# Patient Record
Sex: Male | Born: 1970 | Race: White | Hispanic: No | Marital: Single | Smoking: Current every day smoker
Health system: Southern US, Community
[De-identification: ages and names within clinical notes are randomized; demographics above are authoritative.]

## PROBLEM LIST (undated history)

## (undated) DIAGNOSIS — R51 Headache: Secondary | ICD-10-CM

## (undated) DIAGNOSIS — K5712 Diverticulitis of small intestine without perforation or abscess without bleeding: Secondary | ICD-10-CM

## (undated) DIAGNOSIS — Q600 Renal agenesis, unilateral: Secondary | ICD-10-CM

## (undated) DIAGNOSIS — K589 Irritable bowel syndrome without diarrhea: Secondary | ICD-10-CM

## (undated) DIAGNOSIS — K5710 Diverticulosis of small intestine without perforation or abscess without bleeding: Secondary | ICD-10-CM

## (undated) DIAGNOSIS — K219 Gastro-esophageal reflux disease without esophagitis: Secondary | ICD-10-CM

## (undated) DIAGNOSIS — M4800 Spinal stenosis, site unspecified: Secondary | ICD-10-CM

## (undated) DIAGNOSIS — M1990 Unspecified osteoarthritis, unspecified site: Secondary | ICD-10-CM

## (undated) DIAGNOSIS — Z8781 Personal history of (healed) traumatic fracture: Secondary | ICD-10-CM

## (undated) DIAGNOSIS — K253 Acute gastric ulcer without hemorrhage or perforation: Secondary | ICD-10-CM

## (undated) DIAGNOSIS — F419 Anxiety disorder, unspecified: Secondary | ICD-10-CM

## (undated) DIAGNOSIS — F32A Depression, unspecified: Secondary | ICD-10-CM

## (undated) DIAGNOSIS — I10 Essential (primary) hypertension: Secondary | ICD-10-CM

## (undated) DIAGNOSIS — IMO0002 Reserved for concepts with insufficient information to code with codable children: Secondary | ICD-10-CM

## (undated) DIAGNOSIS — G8929 Other chronic pain: Secondary | ICD-10-CM

## (undated) DIAGNOSIS — F329 Major depressive disorder, single episode, unspecified: Secondary | ICD-10-CM

## (undated) DIAGNOSIS — F209 Schizophrenia, unspecified: Secondary | ICD-10-CM

## (undated) DIAGNOSIS — M549 Dorsalgia, unspecified: Secondary | ICD-10-CM

## (undated) HISTORY — DX: Diverticulosis of small intestine without perforation or abscess without bleeding: K57.10

## (undated) HISTORY — DX: Renal agenesis, unilateral: Q60.0

## (undated) HISTORY — DX: Diverticulitis of small intestine without perforation or abscess without bleeding: K57.12

---
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
## 2005-08-21 ENCOUNTER — Encounter: Payer: Self-pay | Admitting: Orthopedic Surgery

## 2007-08-10 ENCOUNTER — Ambulatory Visit (HOSPITAL_COMMUNITY)
Admission: RE | Admit: 2007-08-10 | Discharge: 2007-08-10 | Payer: Self-pay | Admitting: Physical Medicine and Rehabilitation

## 2007-08-10 ENCOUNTER — Encounter: Payer: Self-pay | Admitting: Orthopedic Surgery

## 2009-06-24 ENCOUNTER — Encounter: Payer: Self-pay | Admitting: Orthopedic Surgery

## 2009-06-27 ENCOUNTER — Telehealth: Payer: Self-pay | Admitting: Orthopedic Surgery

## 2009-06-27 ENCOUNTER — Ambulatory Visit: Payer: Self-pay | Admitting: Orthopedic Surgery

## 2009-06-27 DIAGNOSIS — M549 Dorsalgia, unspecified: Secondary | ICD-10-CM | POA: Insufficient documentation

## 2009-07-05 ENCOUNTER — Telehealth: Payer: Self-pay | Admitting: Orthopedic Surgery

## 2009-07-09 ENCOUNTER — Encounter (INDEPENDENT_AMBULATORY_CARE_PROVIDER_SITE_OTHER): Payer: Self-pay | Admitting: *Deleted

## 2009-09-09 ENCOUNTER — Emergency Department (HOSPITAL_COMMUNITY): Admission: EM | Admit: 2009-09-09 | Discharge: 2009-09-09 | Payer: Self-pay | Admitting: Emergency Medicine

## 2010-02-18 NOTE — Miscellaneous (Signed)
Summary: pain clinic  Clinical Lists Changes  Patient does have [place] [facility], but [facility] does not offer pain management so this pt will have to pay out of pocket, I advised pt, he is calling pain med center now to see if they can set up payment arrangements at [phone].
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
## 2010-02-18 NOTE — Progress Notes (Signed)
Summary: patient req's referral to a [facility] facility,apply'g for discount  Phone Note Outgoing Call   Call placed to: Patient Summary of Call: Patient is to apply for [facility] discount today; requests his pain managment referral be made to a [facility] facility if possible.  Initial call taken by: Cammie Sickle,  June 27, 2009 11:11 AM  Follow-up for Phone Call        carol is this possible   i am the only ortho that takes [place] discount   i cant help him with this   he can be referred to the dr of his choice  Follow-up by: Fuller Canada MD,  June 27, 2009 11:39 AM  Additional Follow-up for Phone Call Additional follow up Details #1::        The [place]-related pain management office is [facility] on [address]  - per Noreene Larsson, [facility] referral coordinator -  Ph [phone] and Fax [phone].  I left patient voice mail msg to check on status if applied for discount.  Ref form copy in referral box Additional Follow-up by: Cammie Sickle,  July 03, 2009 12:30 PM

## 2010-02-18 NOTE — Progress Notes (Signed)
Summary: Referral to Pain Clinic.  Phone Note Outgoing Call   Call placed by: Waldon Reining,  July 05, 2009 8:11 AM Call placed to: Specialist Action Taken: Information Sent Summary of Call: I faxed a referral for this patient to [facility] in [place] for pain management for chronic back pain.

## 2010-02-18 NOTE — Progress Notes (Signed)
Summary: pain management  Phone Note Call from Patient Call back at Home Phone [phone]   Summary of Call: called pt and let him know on his machine that we faxed order for pain management in [place] and he will be required to pay 300 dlllars the 1st visit and then be billed the rest, I advised him where we referred him and if he would call them and let them know if he can afford this or not, other info in EMR notes if needed  I faxed a referral for this patient to [facility] in [place] for pain management for chronic back pain. Initial call taken by: Ether Griffins,  July 05, 2009 11:19 AM  Follow-up for Phone Call        patient has financial application in processing, being reviewed for [employer] discount. Follow-up by: Cammie Sickle,  July 05, 2009 1:37 PM

## 2010-02-18 NOTE — Letter (Signed)
Summary: History form  History form   Imported By: Jacklynn Ganong 07/04/2009 13:23:11  _____________________________________________________________________  External Attachment:    Type:   Image     Comment:   External Document

## 2010-04-16 ENCOUNTER — Other Ambulatory Visit: Payer: Self-pay | Admitting: Orthopedic Surgery

## 2011-08-21 ENCOUNTER — Other Ambulatory Visit: Payer: Self-pay | Admitting: Rheumatology

## 2011-08-21 ENCOUNTER — Ambulatory Visit
Admission: RE | Admit: 2011-08-21 | Discharge: 2011-08-21 | Disposition: A | Discharge status: Home / Self Care | Payer: Self-pay | Source: Ambulatory Visit | Attending: Rheumatology | Admitting: Rheumatology

## 2011-08-21 DIAGNOSIS — M545 Low back pain: Secondary | ICD-10-CM

## 2011-08-21 DIAGNOSIS — M549 Dorsalgia, unspecified: Secondary | ICD-10-CM

## 2011-11-17 ENCOUNTER — Other Ambulatory Visit (HOSPITAL_COMMUNITY): Payer: Self-pay | Admitting: Orthopaedic Surgery

## 2011-11-17 DIAGNOSIS — M4310 Spondylolisthesis, site unspecified: Secondary | ICD-10-CM

## 2011-11-19 ENCOUNTER — Ambulatory Visit (HOSPITAL_COMMUNITY)
Admission: RE | Admit: 2011-11-19 | Discharge: 2011-11-19 | Disposition: A | Discharge status: Home / Self Care | Payer: Self-pay | Source: Ambulatory Visit | Attending: Orthopaedic Surgery | Admitting: Orthopaedic Surgery

## 2011-11-19 DIAGNOSIS — M5137 Other intervertebral disc degeneration, lumbosacral region: Secondary | ICD-10-CM | POA: Insufficient documentation

## 2011-11-19 DIAGNOSIS — M545 Low back pain, unspecified: Secondary | ICD-10-CM | POA: Insufficient documentation

## 2011-11-19 DIAGNOSIS — M51379 Other intervertebral disc degeneration, lumbosacral region without mention of lumbar back pain or lower extremity pain: Secondary | ICD-10-CM | POA: Insufficient documentation

## 2011-11-19 DIAGNOSIS — M4310 Spondylolisthesis, site unspecified: Secondary | ICD-10-CM

## 2012-02-04 ENCOUNTER — Emergency Department (HOSPITAL_COMMUNITY): Payer: Self-pay

## 2012-02-04 ENCOUNTER — Emergency Department (HOSPITAL_COMMUNITY)
Admission: EM | Admit: 2012-02-04 | Discharge: 2012-02-04 | Disposition: A | Discharge status: Home / Self Care | Payer: Self-pay | Attending: Emergency Medicine | Admitting: Emergency Medicine

## 2012-02-04 ENCOUNTER — Encounter (HOSPITAL_COMMUNITY): Payer: Self-pay | Admitting: Emergency Medicine

## 2012-02-04 DIAGNOSIS — F17200 Nicotine dependence, unspecified, uncomplicated: Secondary | ICD-10-CM | POA: Insufficient documentation

## 2012-02-04 DIAGNOSIS — K5710 Diverticulosis of small intestine without perforation or abscess without bleeding: Secondary | ICD-10-CM

## 2012-02-04 DIAGNOSIS — K5712 Diverticulitis of small intestine without perforation or abscess without bleeding: Secondary | ICD-10-CM | POA: Insufficient documentation

## 2012-02-04 DIAGNOSIS — E876 Hypokalemia: Secondary | ICD-10-CM | POA: Insufficient documentation

## 2012-02-04 LAB — URINALYSIS, ROUTINE W REFLEX MICROSCOPIC
Bilirubin Urine: NEGATIVE
Ketones, ur: 15 mg/dL — AB
Nitrite: NEGATIVE
Protein, ur: NEGATIVE mg/dL
Urobilinogen, UA: 0.2 mg/dL (ref 0.0–1.0)

## 2012-02-04 LAB — COMPREHENSIVE METABOLIC PANEL
Alkaline Phosphatase: 73 U/L (ref 39–117)
BUN: 14 mg/dL (ref 6–23)
Chloride: 92 mEq/L — ABNORMAL LOW (ref 96–112)
Creatinine, Ser: 1.13 mg/dL (ref 0.50–1.35)
GFR calc Af Amer: >90 mL/min (ref >90)
Glucose, Bld: 114 mg/dL — ABNORMAL HIGH (ref 70–99)
Potassium: 2.9 mEq/L — ABNORMAL LOW (ref 3.5–5.1)
Total Bilirubin: 0.4 mg/dL (ref 0.3–1.2)
Total Protein: 7.6 g/dL (ref 6.0–8.3)

## 2012-02-04 LAB — CBC WITH DIFFERENTIAL/PLATELET
Eosinophils Absolute: 0.1 10*3/uL (ref 0.0–0.7)
HCT: 41.5 % (ref 39.0–52.0)
Hemoglobin: 14.5 g/dL (ref 13.0–17.0)
Lymphs Abs: 2 10*3/uL (ref 0.7–4.0)
MCH: 30.2 pg (ref 26.0–34.0)
Monocytes Absolute: 0.7 10*3/uL (ref 0.1–1.0)
Monocytes Relative: 6 % (ref 3–12)
Neutrophils Relative %: 76 % (ref 43–77)
RBC: 4.8 MIL/uL (ref 4.22–5.81)

## 2012-02-04 LAB — LIPASE, BLOOD: Lipase: 20 U/L (ref 11–59)

## 2012-02-04 MED ORDER — IOHEXOL 300 MG/ML  SOLN
100.0000 mL | Freq: Once | INTRAMUSCULAR | Status: AC | PRN
  Administered 2012-02-04: 100 mL via INTRAVENOUS

## 2012-02-04 MED ORDER — METRONIDAZOLE 500 MG PO TABS: 500.0000 mg | ORAL_TABLET | Freq: Two times a day (BID) | ORAL | Status: DC

## 2012-02-04 MED ORDER — ONDANSETRON HCL 4 MG/2ML IJ SOLN
4.0000 mg | Freq: Once | INTRAMUSCULAR | Status: AC
  Administered 2012-02-04: 4 mg via INTRAVENOUS
  Filled 2012-02-04: qty 2

## 2012-02-04 MED ORDER — CIPROFLOXACIN IN D5W 400 MG/200ML IV SOLN
400.0000 mg | Freq: Once | INTRAVENOUS | Status: AC
  Administered 2012-02-04: 400 mg via INTRAVENOUS
  Filled 2012-02-04: qty 200

## 2012-02-04 MED ORDER — PANTOPRAZOLE SODIUM 40 MG IV SOLR
40.0000 mg | Freq: Once | INTRAVENOUS | Status: AC
  Administered 2012-02-04: 40 mg via INTRAVENOUS
  Filled 2012-02-04: qty 40

## 2012-02-04 MED ORDER — CIPROFLOXACIN HCL 500 MG PO TABS: 500.0000 mg | ORAL_TABLET | Freq: Two times a day (BID) | ORAL | Status: DC

## 2012-02-04 MED ORDER — OXYCODONE-ACETAMINOPHEN 5-325 MG PO TABS: 2.0000 | ORAL_TABLET | ORAL | Status: DC | PRN

## 2012-02-04 MED ORDER — SODIUM CHLORIDE 0.9 % IV BOLUS (SEPSIS)
1000.0000 mL | Freq: Once | INTRAVENOUS | Status: AC
  Administered 2012-02-04: 1000 mL via INTRAVENOUS

## 2012-02-04 MED ORDER — ONDANSETRON HCL 4 MG PO TABS: 4.0000 mg | ORAL_TABLET | Freq: Four times a day (QID) | ORAL | Status: DC

## 2012-02-04 MED ORDER — METRONIDAZOLE IN NACL 5-0.79 MG/ML-% IV SOLN
500.0000 mg | Freq: Once | INTRAVENOUS | Status: AC
  Administered 2012-02-04: 500 mg via INTRAVENOUS
  Filled 2012-02-04: qty 100

## 2012-02-04 MED ORDER — POTASSIUM CHLORIDE CRYS ER 20 MEQ PO TBCR
40.0000 meq | EXTENDED_RELEASE_TABLET | Freq: Once | ORAL | Status: AC
  Administered 2012-02-04: 40 meq via ORAL
  Filled 2012-02-04: qty 2

## 2012-02-04 MED ORDER — IOHEXOL 300 MG/ML  SOLN: 50.0000 mL | Freq: Once | INTRAMUSCULAR | Status: DC | PRN

## 2012-02-04 MED ORDER — IOHEXOL 300 MG/ML  SOLN
50.0000 mL | Freq: Once | INTRAMUSCULAR | Status: AC | PRN
  Administered 2012-02-04: 50 mL via INTRAVENOUS

## 2012-02-04 MED ORDER — POTASSIUM CHLORIDE 10 MEQ/100ML IV SOLN
10.0000 meq | Freq: Once | INTRAVENOUS | Status: AC
  Administered 2012-02-04: 10 meq via INTRAVENOUS
  Filled 2012-02-04: qty 100

## 2012-02-04 MED ORDER — GI COCKTAIL ~~LOC~~
30.0000 mL | Freq: Once | ORAL | Status: AC
  Administered 2012-02-04: 30 mL via ORAL
  Filled 2012-02-04: qty 30

## 2012-02-04 NOTE — ED Provider Notes (Signed)
History  This chart was scribed for Glynn Octave, MD by Shari Heritage, ED Scribe. The patient was seen in room APA07/APA07. Patient's care was started at 0954.  CSN: [id number]  Arrival date & time 02/04/12  0944   First MD Initiated Contact with Patient 02/04/12 [phone]      Chief Complaint  Patient presents with  . Abdominal Pain     The history is provided by the patient. No language interpreter was used.    HPI Comments: Benjamin [name] is a 42 y.o. male who presents to the Emergency Department complaining of constant, moderate, burning, epigastric and RUQ abdominal pain onset 2 weeks ago. Patient also reports vomiting, dizziness and shortness of breath. His last episode of emesis was 2 days ago. Patient denies diarrhea. Patient says that drinking chocolate milk used to improve pain, but is no longer effective. Patient is mostly intolerant of solid foods, but is tolerant of fluids. Patient has a medical history of back pain. He states that he has been taking ibuprofen regularly for pain relief. He denies any history of chronic GI conditions or abdominal surgeries. No history of cholecystectomy, ulcer or reflux. Patient is a current every day smoker. He drinks alcohol occasionally.    Past Medical History  Diagnosis Date  . Back pain     History reviewed. No pertinent past surgical history.  History reviewed. No pertinent family history.  History  Substance Use Topics  . Smoking status: Current Every Day Smoker    Types: Cigarettes  . Smokeless tobacco: Not on file  . Alcohol Use: No      Review of Systems A complete 10 system review of systems was obtained and all systems are negative except as noted in the HPI and PMH.   Allergies  Review of patient's allergies indicates no known allergies.  Home Medications   Current Outpatient Rx  Name  Route  Sig  Dispense  Refill  . HYDROCODONE-ACETAMINOPHEN 10-325 MG PO TABS   Oral   Take 1 tablet by mouth every 6 (six)  hours as needed. Pain         . IBUPROFEN 200 MG PO TABS   Oral   Take 800 mg by mouth every 6 (six) hours as needed. Pain           Triage Vitals: BP 135/89  Pulse 90  Temp 97.9 F (36.6 C) (Oral)  Resp 22  SpO2 100%  Physical Exam  Constitutional: He is oriented to person, place, and time. He appears well-developed and well-nourished.  HENT:  Head: Normocephalic and atraumatic.  Eyes: Conjunctivae normal and EOM are normal. Pupils are equal, round, and reactive to light.  Neck: Neck supple.  Cardiovascular: Normal rate, regular rhythm and normal heart sounds.   No murmur heard. Pulmonary/Chest: Effort normal. No respiratory distress. He has no decreased breath sounds. He has no wheezes. He has no rhonchi. He has no rales.  Abdominal: Soft. Bowel sounds are normal. There is tenderness (mild) in the right upper quadrant and epigastric area. There is no rebound, no guarding and no CVA tenderness.  Musculoskeletal: Normal range of motion. He exhibits no edema.  Neurological: He is alert and oriented to person, place, and time.  Skin: Skin is warm and dry. No rash noted.    ED Course  Procedures (including critical care time) DIAGNOSTIC STUDIES: Oxygen Saturation is 100% on room air, normal by my interpretation.    COORDINATION OF CARE: 10:05 AM- Patient informed of current plan  for treatment and evaluation and agrees with plan at this time.      Labs Reviewed  CBC WITH DIFFERENTIAL - Abnormal; Notable for the following:    WBC 11.8 (*)     Neutro Abs 9.0 (*)     All other components within normal limits  COMPREHENSIVE METABOLIC PANEL - Abnormal; Notable for the following:    Sodium 132 (*)     Potassium 2.9 (*)     Chloride 92 (*)     Glucose, Bld 114 (*)     GFR calc non Af Amer 79 (*)     All other components within normal limits  LIPASE, BLOOD  TROPONIN I  URINALYSIS, ROUTINE W REFLEX MICROSCOPIC   Ct Abdomen Pelvis W Contrast  02/04/2012  *RADIOLOGY  REPORT*  Clinical Data: Epigastric pain  CT ABDOMEN AND PELVIS WITH CONTRAST  Technique:  Multidetector CT imaging of the abdomen and pelvis was performed following the standard protocol during bolus administration of intravenous contrast.  Contrast: 1 OMNIPAQUE IOHEXOL 300 MG/ML  SOLN, OMNIPAQUE IOHEXOL 300 MG/ML  SOLN  Comparison: None.  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  There is marked wall thickening and inflammatory changes in the adjacent fat.  Small nodes are seen to the left of the diverticulum on image 28.  These findings are most consistent with acute diverticulitis of the duodenum.  Liver, gallbladder, spleen, pancreas are within normal limits. Specifically, there is a fat plane between the pancreas in the area of inflammatory changes.  Left kidney is absent.  There is compensatory enlargement of the right kidney.  No free fluid.  Mild bladder wall thickening in a diffuse fashion.  No other obvious evidence of abnormal adenopathy.  Mild degenerative disc disease in the lumbar spine.  No destructive bone lesion.  IMPRESSION: Findings above are most consistent with acute duodenal diverticulitis as described above.  No evidence of perforation or abscess.  Solitary right kidney.  This can be associated with congenital genitourinary anomalies.   Original Report Authenticated By: Jolaine Click, M.D.    Dg Abd Acute W/chest  02/04/2012  *RADIOLOGY REPORT*  Clinical Data: Abdominal pain, vomiting  ACUTE ABDOMEN SERIES (ABDOMEN 2 VIEW & CHEST 1 VIEW)  Comparison: None.  Findings: Lungs are clear. No pleural effusion or pneumothorax.  Cardiomediastinal silhouette is within normal limits.  Nonspecific bowel gas pattern without disproportionate small bowel dilatation to suggest small bowel obstruction.  Air fluid levels are present on the upright radiograph, suggesting adynamic ileus or small bowel enteritis.  Visualized osseous structures  are within normal limits.  IMPRESSION: No evidence of acute cardiopulmonary disease.  No evidence of small bowel obstruction or free air.  Possible adynamic ileus or small bowel enteritis.   Original Report Authenticated By: Charline Bills, M.D.      No diagnosis found.    MDM  2 weeks of epigastric, right upper quadrant, lower xiphoid pain with nausea. Worse with eating, better with drinking milk. History of heavy NSAID use. Denies alcohol use. No history of gastritis or ulcers. No cardiac history.  Labs remarkable for hypokalemia of 2.9. Normal kidney function.  Imaging remarkable for acute duodenal diverticulitis. Patient is feeling better. Denies pain. has had no vomiting in the ED. Findings discussed with Dr. Leticia Penna of surgery. He agrees with outpatient management including antibiotics, pain control, followup in the office for possible surgical intervention if diverticulitis is not resolve spontaneously. Patient comfortable with plan.  Date: 02/04/2012  Rate: 88  Rhythm: normal sinus rhythm  QRS Axis: normal  Intervals: normal  ST/T Wave abnormalities: normal  Conduction Disutrbances:none  Narrative Interpretation: LVH  Old EKG Reviewed: none available    I personally performed the services described in this documentation, which was scribed in my presence. The recorded information has been reviewed and is accurate.    Glynn Octave, MD 02/04/12 1600

## 2012-02-04 NOTE — ED Notes (Signed)
Pt states epigastric pain burning for two weeks. Some dizziness and vomiting but denies SOB.

## 2012-02-04 NOTE — ED Notes (Signed)
Have ask pt to give urine two times, he said he couldn't go yet

## 2012-02-04 NOTE — ED Notes (Signed)
PT c/o chronic lower back pain and reports epigastric/chest pain for past 3 weeks, vomiting x 2 days.  Denies diarrhea.

## 2012-04-27 ENCOUNTER — Ambulatory Visit (INDEPENDENT_AMBULATORY_CARE_PROVIDER_SITE_OTHER): Payer: Self-pay | Admitting: Gastroenterology

## 2012-04-27 ENCOUNTER — Encounter: Payer: Self-pay | Admitting: Gastroenterology

## 2012-04-27 VITALS — BP 128/82 | HR 93 | Temp 97.8°F | Ht 67.0 in | Wt 146.2 lb

## 2012-04-27 DIAGNOSIS — K5900 Constipation, unspecified: Secondary | ICD-10-CM

## 2012-04-27 DIAGNOSIS — K219 Gastro-esophageal reflux disease without esophagitis: Secondary | ICD-10-CM

## 2012-04-27 MED ORDER — PEG 3350-KCL-NA BICARB-NACL 420 G PO SOLR: 4000.0000 mL | ORAL | Status: DC

## 2012-04-27 MED ORDER — OMEPRAZOLE 20 MG PO CPDR: 20.0000 mg | DELAYED_RELEASE_CAPSULE | Freq: Every day | ORAL | Status: DC

## 2012-04-27 MED ORDER — LINACLOTIDE 145 MCG PO CAPS: 145.0000 ug | ORAL_CAPSULE | Freq: Every day | ORAL | Status: DC

## 2012-04-27 NOTE — Progress Notes (Signed)
Primary Care Physician:  Sissy Hoff, MD Primary Gastroenterologist:  Dr. Jena Gauss   Chief Complaint  Patient presents with  . Diverticulitis    HPI:   Benjamin [name] is a 42 year old male presenting today as a referral from the ED secondary to duodenal diverticulitis. He was treated with antibiotics in Jan 2014. He presents today stating that his stool is dark yellow, looks like white mold on it, +rectal bleeding. Hard stool. BM about once a day. Balls. States he has seen some black stool. Notes LUQ pain, not affected by po intake. No N/V. No aggravating factors. Present for a few minutes. +reflux. OTC antacids. No dysphagia. Ibuprofen every few days. No aspirin powders. Good appetite. Stable weight.   Past Medical History  Diagnosis Date  . Back pain   . Duodenal diverticulum   . Diverticulitis of duodenum     Past Surgical History  Procedure Laterality Date  . None      Current Outpatient Prescriptions  Medication Sig Dispense Refill  . gabapentin (NEURONTIN) 600 MG tablet Take 600 mg by mouth 3 (three) times daily.      Marland Kitchen HYDROcodone-acetaminophen (NORCO) 10-325 MG per tablet Take 1 tablet by mouth every 6 (six) hours as needed. Pain      . ibuprofen (ADVIL,MOTRIN) 200 MG tablet Take 800 mg by mouth every 6 (six) hours as needed. Pain      . Linaclotide (LINZESS) 145 MCG CAPS Take 1 capsule (145 mcg total) by mouth daily.  30 capsule  3  . omeprazole (PRILOSEC) 20 MG capsule Take 1 capsule (20 mg total) by mouth daily.  30 capsule  3  . polyethylene glycol-electrolytes (TRILYTE) 420 G solution Take 4,000 mLs by mouth as directed.  4000 mL  0   No current facility-administered medications for this visit.    Allergies as of 04/27/2012  . (No Known Allergies)    Family History  Problem Relation Age of Onset  . Colon cancer Neg Hx     History   Social History  . Marital Status: Single    Spouse Name: N/A    Number of Children: N/A  . Years of Education: N/A    Occupational History  . unemployed     trying to obtain disability   Social History Main Topics  . Smoking status: Current Every Day Smoker    Types: Cigarettes  . Smokeless tobacco: Not on file  . Alcohol Use: No  . Drug Use: No  . Sexually Active: Not on file   Other Topics Concern  . Not on file   Social History Narrative  . No narrative on file    Review of Systems: Gen: SEE HPI CV: Denies chest pain, heart palpitations, peripheral edema, syncope.  Resp: Denies shortness of breath at rest or with exertion. Denies wheezing or cough.  GI: SEE HPI GU : +urinary frequency MS: +back pain Derm: Denies rash, itching, dry skin Psych: Denies depression, anxiety, memory loss, and confusion Heme: Denies bruising, bleeding, and enlarged lymph nodes.  Physical Exam: BP 128/82  Pulse 93  Temp(Src) 97.8 F (36.6 C) (Oral)  Ht 5\' 7"  (1.702 m)  Wt 146 lb 3.2 oz (66.316 kg)  BMI 22.89 kg/m2 General:   Alert and oriented. Pleasant and cooperative. Well-nourished and well-developed.  Head:  Normocephalic and atraumatic. Eyes:  Without icterus, sclera clear and conjunctiva pink.  Ears:  Normal auditory acuity. Nose:  No deformity, discharge,  or lesions. Mouth:  No deformity or lesions, oral mucosa  pink.  Neck:  Supple, without mass or thyromegaly. Lungs:  Clear to auscultation bilaterally. No wheezes, rales, or rhonchi. No distress.  Heart:  S1, S2 present without murmurs appreciated.  Abdomen:  +BS, soft, non-tender and non-distended. No HSM noted. No guarding or rebound. No masses appreciated.  Rectal:  Deferred  Msk:  Symmetrical without gross deformities. Normal posture. Extremities:  Without clubbing or edema. Neurologic:  Alert and  oriented x4;  grossly normal neurologically. Skin:  Intact without significant lesions or rashes. Cervical Nodes:  No significant cervical adenopathy. Psych:  Alert and cooperative. Normal mood and affect.

## 2012-04-27 NOTE — Patient Instructions (Addendum)
Please fill out the patient assistance forms for Linzess, a medication to help with your bowel movements.   Start taking Linzess each morning, 30 minutes before breakfast. Also, take Prilosec each morning, 30 minutes before breakfast. This is for reflux.   We have scheduled you for a colonoscopy with Dr. Jena Gauss in the near future.  Further recommendations to follow.

## 2012-04-29 DIAGNOSIS — K219 Gastro-esophageal reflux disease without esophagitis: Secondary | ICD-10-CM | POA: Insufficient documentation

## 2012-04-29 DIAGNOSIS — K5900 Constipation, unspecified: Secondary | ICD-10-CM | POA: Insufficient documentation

## 2012-04-29 NOTE — Assessment & Plan Note (Signed)
42 year old male with recent presentation to the ED and found to have duodenal diverticulitis on CT; he was treated with antibiotics and improved. Notes constipation and intermittent hematochezia. No prior colonoscopy. Likely hematochezia benign source in the setting of constipation. Start Linzess 145 mcg daily, proceed with colonoscopy in near future.  Proceed with TCS with Dr. Jena Gauss in near future: the risks, benefits, and alternatives have been discussed with the patient in detail. The patient states understanding and desires to proceed.

## 2012-04-29 NOTE — Assessment & Plan Note (Signed)
GERD symptoms with intermittent LUQ discomfort, fleeting, no N/V or aggravating factors. No PPI currently. Start on Prilosec daily. No dysphagia. If patient does not improve, will need EGD.

## 2012-05-02 NOTE — Progress Notes (Signed)
Cc PCP 

## 2012-05-04 ENCOUNTER — Encounter (HOSPITAL_COMMUNITY): Payer: Self-pay | Admitting: Pharmacy Technician

## 2012-05-11 ENCOUNTER — Telehealth: Payer: Self-pay | Admitting: Gastroenterology

## 2012-05-11 NOTE — Telephone Encounter (Signed)
How is LUQ discomfort since starting PPI? Scheduled for colonoscopy April 28th.  May need EGD if no improvement.

## 2012-05-11 NOTE — Telephone Encounter (Signed)
Tried to call with no answer  

## 2012-05-16 ENCOUNTER — Encounter (HOSPITAL_COMMUNITY): Payer: Self-pay | Admitting: *Deleted

## 2012-05-16 ENCOUNTER — Ambulatory Visit (HOSPITAL_COMMUNITY)
Admission: RE | Admit: 2012-05-16 | Discharge: 2012-05-16 | Disposition: A | Discharge status: Home / Self Care | Payer: Self-pay | Source: Ambulatory Visit | Attending: Internal Medicine | Admitting: Internal Medicine

## 2012-05-16 ENCOUNTER — Encounter (HOSPITAL_COMMUNITY)
Admission: RE | Disposition: A | Discharge status: Home / Self Care | Payer: Self-pay | Source: Ambulatory Visit | Attending: Internal Medicine

## 2012-05-16 ENCOUNTER — Telehealth: Payer: Self-pay

## 2012-05-16 DIAGNOSIS — K5900 Constipation, unspecified: Secondary | ICD-10-CM | POA: Insufficient documentation

## 2012-05-16 DIAGNOSIS — K219 Gastro-esophageal reflux disease without esophagitis: Secondary | ICD-10-CM

## 2012-05-16 DIAGNOSIS — K921 Melena: Secondary | ICD-10-CM | POA: Insufficient documentation

## 2012-05-16 DIAGNOSIS — D126 Benign neoplasm of colon, unspecified: Secondary | ICD-10-CM | POA: Insufficient documentation

## 2012-05-16 HISTORY — PX: COLONOSCOPY: SHX5424

## 2012-05-16 SURGERY — COLONOSCOPY
Anesthesia: Moderate Sedation

## 2012-05-16 MED ORDER — STERILE WATER FOR IRRIGATION IR SOLN
Status: DC | PRN
  Administered 2012-05-16: 13:00:00

## 2012-05-16 MED ORDER — MIDAZOLAM HCL 5 MG/5ML IJ SOLN
INTRAMUSCULAR | Status: DC | PRN
  Administered 2012-05-16 (×2): 2 mg via INTRAVENOUS
  Administered 2012-05-16: 1 mg via INTRAVENOUS

## 2012-05-16 MED ORDER — MIDAZOLAM HCL 5 MG/5ML IJ SOLN
INTRAMUSCULAR | Status: AC
  Filled 2012-05-16: qty 10

## 2012-05-16 MED ORDER — PROMETHAZINE HCL 25 MG/ML IJ SOLN
25.0000 mg | Freq: Once | INTRAMUSCULAR | Status: AC
  Administered 2012-05-16: 25 mg via INTRAVENOUS

## 2012-05-16 MED ORDER — ONDANSETRON HCL 4 MG/2ML IJ SOLN
INTRAMUSCULAR | Status: DC | PRN
  Administered 2012-05-16: 4 mg via INTRAVENOUS

## 2012-05-16 MED ORDER — PROMETHAZINE HCL 25 MG/ML IJ SOLN
INTRAMUSCULAR | Status: AC
  Administered 2012-05-16: 25 mg via INTRAVENOUS
  Filled 2012-05-16: qty 1

## 2012-05-16 MED ORDER — MEPERIDINE HCL 100 MG/ML IJ SOLN
INTRAMUSCULAR | Status: AC
  Filled 2012-05-16: qty 2

## 2012-05-16 MED ORDER — ONDANSETRON HCL 4 MG/2ML IJ SOLN
INTRAMUSCULAR | Status: AC
  Filled 2012-05-16: qty 2

## 2012-05-16 MED ORDER — MEPERIDINE HCL 100 MG/ML IJ SOLN
INTRAMUSCULAR | Status: DC | PRN
  Administered 2012-05-16: 50 mg via INTRAVENOUS
  Administered 2012-05-16 (×2): 25 mg via INTRAVENOUS

## 2012-05-16 MED ORDER — SODIUM CHLORIDE 0.9 % IV SOLN
INTRAVENOUS | Status: DC
  Administered 2012-05-16: 13:00:00 via INTRAVENOUS

## 2012-05-16 MED ORDER — SODIUM CHLORIDE 0.9 % IJ SOLN
INTRAMUSCULAR | Status: AC
  Filled 2012-05-16: qty 10

## 2012-05-16 NOTE — H&P (View-Only) (Signed)
Primary Care Physician:  SWAYNE,DAVID W, MD Primary Gastroenterologist:  Dr. Rourk   Chief Complaint  Patient presents with  . Diverticulitis    HPI:   Benjamin [name] is a 42-year-old male presenting today as a referral from the ED secondary to duodenal diverticulitis. He was treated with antibiotics in Jan 2014. He presents today stating that his stool is dark yellow, looks like white mold on it, +rectal bleeding. Hard stool. BM about once a day. Balls. States he has seen some black stool. Notes LUQ pain, not affected by po intake. No N/V. No aggravating factors. Present for a few minutes. +reflux. OTC antacids. No dysphagia. Ibuprofen every few days. No aspirin powders. Good appetite. Stable weight.   Past Medical History  Diagnosis Date  . Back pain   . Duodenal diverticulum   . Diverticulitis of duodenum     Past Surgical History  Procedure Laterality Date  . None      Current Outpatient Prescriptions  Medication Sig Dispense Refill  . gabapentin (NEURONTIN) 600 MG tablet Take 600 mg by mouth 3 (three) times daily.      . HYDROcodone-acetaminophen (NORCO) 10-325 MG per tablet Take 1 tablet by mouth every 6 (six) hours as needed. Pain      . ibuprofen (ADVIL,MOTRIN) 200 MG tablet Take 800 mg by mouth every 6 (six) hours as needed. Pain      . Linaclotide (LINZESS) 145 MCG CAPS Take 1 capsule (145 mcg total) by mouth daily.  30 capsule  3  . omeprazole (PRILOSEC) 20 MG capsule Take 1 capsule (20 mg total) by mouth daily.  30 capsule  3  . polyethylene glycol-electrolytes (TRILYTE) 420 G solution Take 4,000 mLs by mouth as directed.  4000 mL  0   No current facility-administered medications for this visit.    Allergies as of 04/27/2012  . (No Known Allergies)    Family History  Problem Relation Age of Onset  . Colon cancer Neg Hx     History   Social History  . Marital Status: Single    Spouse Name: N/A    Number of Children: N/A  . Years of Education: N/A    Occupational History  . unemployed     trying to obtain disability   Social History Main Topics  . Smoking status: Current Every Day Smoker    Types: Cigarettes  . Smokeless tobacco: Not on file  . Alcohol Use: No  . Drug Use: No  . Sexually Active: Not on file   Other Topics Concern  . Not on file   Social History Narrative  . No narrative on file    Review of Systems: Gen: SEE HPI CV: Denies chest pain, heart palpitations, peripheral edema, syncope.  Resp: Denies shortness of breath at rest or with exertion. Denies wheezing or cough.  GI: SEE HPI GU : +urinary frequency MS: +back pain Derm: Denies rash, itching, dry skin Psych: Denies depression, anxiety, memory loss, and confusion Heme: Denies bruising, bleeding, and enlarged lymph nodes.  Physical Exam: BP 128/82  Pulse 93  Temp(Src) 97.8 F (36.6 C) (Oral)  Ht 5' 7" (1.702 m)  Wt 146 lb 3.2 oz (66.316 kg)  BMI 22.89 kg/m2 General:   Alert and oriented. Pleasant and cooperative. Well-nourished and well-developed.  Head:  Normocephalic and atraumatic. Eyes:  Without icterus, sclera clear and conjunctiva pink.  Ears:  Normal auditory acuity. Nose:  No deformity, discharge,  or lesions. Mouth:  No deformity or lesions, oral mucosa   pink.  Neck:  Supple, without mass or thyromegaly. Lungs:  Clear to auscultation bilaterally. No wheezes, rales, or rhonchi. No distress.  Heart:  S1, S2 present without murmurs appreciated.  Abdomen:  +BS, soft, non-tender and non-distended. No HSM noted. No guarding or rebound. No masses appreciated.  Rectal:  Deferred  Msk:  Symmetrical without gross deformities. Normal posture. Extremities:  Without clubbing or edema. Neurologic:  Alert and  oriented x4;  grossly normal neurologically. Skin:  Intact without significant lesions or rashes. Cervical Nodes:  No significant cervical adenopathy. Psych:  Alert and cooperative. Normal mood and affect.    

## 2012-05-16 NOTE — Op Note (Signed)
[facility] [address] [place], [address]   COLONOSCOPY PROCEDURE REPORT  PATIENT: Benjamin [name], Benjamin [name]  MR#:         [id number] BIRTHDATE: Aug 20, 1970 , 41  yrs. old GENDER: Male ENDOSCOPIST: R.  Roetta Sessions, MD FACP FACG REFERRED BY:  Tally Joe, M.D. PROCEDURE DATE:  05/16/2012 PROCEDURE:     Ileocolonoscopy with biopsy  INDICATIONS: Paper hematochezia in the setting of constipation  INFORMED CONSENT:  The risks, benefits, alternatives and imponderables including but not limited to bleeding, perforation as well as the possibility of a missed lesion have been reviewed.  The potential for biopsy, lesion removal, etc. have also been discussed.  Questions have been answered.  All parties agreeable. Please see the history and physical in the medical record for more information.  MEDICATIONS: Versed 5 mg IV and Demerol 100 mg by doses. Phenergan 25 mg IV and Zofran 4 mg IV  DESCRIPTION OF PROCEDURE:  After a digital rectal exam was performed, the EC-3890Li ([id number])  colonoscope was advanced from the anus through the rectum and colon to the area of the cecum, ileocecal valve and appendiceal orifice.  The cecum was deeply intubated.  These structures were well-seen and photographed for the record.  From the level of the cecum and ileocecal valve, the scope was slowly and cautiously withdrawn.  The mucosal surfaces were carefully surveyed utilizing scope tip deflection to facilitate fold flattening as needed.  The scope was pulled down into the rectum where a thorough examination including retroflexion was performed.    FINDINGS:  Adequate preparation. Anal canal papilla; otherwise normal rectum. (1) diminutive polyp in the base of the cecum; otherwise, the remainder of the colonic mucosa as well as the distal 10 cm of terminal ileal mucosa appeared normal.  THERAPEUTIC / DIAGNOSTIC MANEUVERS PERFORMED:  The above-mentioned polyp was cold  biopsied/removed.  COMPLICATIONS: None  CECAL WITHDRAWAL TIME:  12 minutes  IMPRESSION:  Cecal polyp-removed as described above  RECOMMENDATIONS: Increase Linzess to 290 micrograms  daily; Followup on pathology . _______________________________ eSigned:  R. Roetta Sessions, MD FACP [facility] 05/16/2012 2:00 PM   CC:

## 2012-05-16 NOTE — Interval H&P Note (Signed)
History and Physical Interval Note:  05/16/2012 1:19 PM  Benjamin [name]  has presented today for surgery, with the diagnosis of GERD, CONSTIPATION  The various methods of treatment have been discussed with the patient and family. After consideration of risks, benefits and other options for treatment, the patient has consented to  Procedure(s) with comments: COLONOSCOPY (N/A) - 1:15 as a surgical intervention .  The patient's history has been reviewed, patient examined, no change in status, stable for surgery.  I have reviewed the patient's chart and labs.  Questions were answered to the patient's satisfaction.     Eula Listen  Colonoscopy per plan. Patient states upper abdominal pain is totally resolved. Not much help with  Linzess at 145 mcg daily.  TCS only today per plan.The risks, benefits, limitations, alternatives and imponderables have been reviewed with the patient. Questions have been answered. All parties are agreeable.

## 2012-05-16 NOTE — Telephone Encounter (Addendum)
Per Durward Mallard, Dr. Jena Gauss called and said to leave some samples of LInzess 290 mcg at the front for the pt. # 4 boxes of the Linzess 290 mcg left at front for the pt to pick up.

## 2012-05-17 DIAGNOSIS — K921 Melena: Secondary | ICD-10-CM

## 2012-05-17 DIAGNOSIS — K5900 Constipation, unspecified: Secondary | ICD-10-CM

## 2012-05-17 DIAGNOSIS — D126 Benign neoplasm of colon, unspecified: Secondary | ICD-10-CM

## 2012-05-18 ENCOUNTER — Encounter (HOSPITAL_COMMUNITY): Payer: Self-pay | Admitting: Internal Medicine

## 2012-05-18 ENCOUNTER — Encounter: Payer: Self-pay | Admitting: Internal Medicine

## 2012-05-19 ENCOUNTER — Telehealth: Payer: Self-pay

## 2012-05-19 NOTE — Telephone Encounter (Signed)
Pt returned patient assistance paperwork for Linzess . Pt needs a written rx to send with paperwork.

## 2012-05-24 MED ORDER — LINACLOTIDE 290 MCG PO CAPS: 290.0000 ug | ORAL_CAPSULE | Freq: Every day | ORAL | Status: DC

## 2012-05-24 NOTE — Telephone Encounter (Signed)
done

## 2012-06-01 ENCOUNTER — Encounter (HOSPITAL_COMMUNITY): Payer: Self-pay

## 2012-06-01 ENCOUNTER — Emergency Department (HOSPITAL_COMMUNITY): Payer: Self-pay

## 2012-06-01 ENCOUNTER — Emergency Department (HOSPITAL_COMMUNITY)
Admission: EM | Admit: 2012-06-01 | Discharge: 2012-06-01 | Disposition: A | Discharge status: Home / Self Care | Payer: Self-pay | Attending: Emergency Medicine | Admitting: Emergency Medicine

## 2012-06-01 DIAGNOSIS — Z87828 Personal history of other (healed) physical injury and trauma: Secondary | ICD-10-CM | POA: Insufficient documentation

## 2012-06-01 DIAGNOSIS — G8929 Other chronic pain: Secondary | ICD-10-CM | POA: Insufficient documentation

## 2012-06-01 DIAGNOSIS — M25519 Pain in unspecified shoulder: Secondary | ICD-10-CM | POA: Insufficient documentation

## 2012-06-01 DIAGNOSIS — F17200 Nicotine dependence, unspecified, uncomplicated: Secondary | ICD-10-CM | POA: Insufficient documentation

## 2012-06-01 DIAGNOSIS — M25512 Pain in left shoulder: Secondary | ICD-10-CM

## 2012-06-01 DIAGNOSIS — K589 Irritable bowel syndrome without diarrhea: Secondary | ICD-10-CM | POA: Insufficient documentation

## 2012-06-01 DIAGNOSIS — Z79899 Other long term (current) drug therapy: Secondary | ICD-10-CM | POA: Insufficient documentation

## 2012-06-01 DIAGNOSIS — Z8719 Personal history of other diseases of the digestive system: Secondary | ICD-10-CM | POA: Insufficient documentation

## 2012-06-01 HISTORY — DX: Dorsalgia, unspecified: M54.9

## 2012-06-01 HISTORY — DX: Personal history of (healed) traumatic fracture: Z87.81

## 2012-06-01 HISTORY — DX: Irritable bowel syndrome, unspecified: K58.9

## 2012-06-01 HISTORY — DX: Other chronic pain: G89.29

## 2012-06-01 MED ORDER — HYDROCODONE-ACETAMINOPHEN 5-325 MG PO TABS: ORAL_TABLET | ORAL | Status: DC

## 2012-06-01 NOTE — ED Notes (Signed)
Pt reports left shoulder pain for 2 weeks, denies any known injury, has hydrocodone for his back and has been taking more than he should, but is not helping his arm pain.

## 2012-06-01 NOTE — ED Provider Notes (Signed)
History     CSN: [id number]  Arrival date & time 06/01/12  0727   First MD Initiated Contact with Patient 06/01/12 0750      Chief Complaint  Patient presents with  . Shoulder Pain     HPI Pt was seen at 0800.   Per pt, c/o gradual onset and persistence of constant left shoulder "pain" for the past 2 weeks.  Pt describes the pain as "aching," worsens with palpation of his shoulder and movement of his left arm.  Cannot recall any injury. Denies fevers, no rash, no focal motor weakness, no tingling/numbness in extremities, no CP/palpitations, no SOB/cough, no abd pain, no back pain.    Ortho: Dr. Hilda Lias Past Medical History  Diagnosis Date  . Duodenal diverticulum   . Diverticulitis of duodenum   . Chronic back pain   . IBS (irritable bowel syndrome)   . H/O clavicle fracture     left    Past Surgical History  Procedure Laterality Date  . Colonoscopy N/A 05/16/2012    Procedure: COLONOSCOPY;  Surgeon: Corbin Ade, MD;  Location: [facility];  Service: Endoscopy;  Laterality: N/A;  1:15    Family History  Problem Relation Age of Onset  . Colon cancer Neg Hx     History  Substance Use Topics  . Smoking status: Current Every Day Smoker    Types: Cigarettes  . Smokeless tobacco: Not on file  . Alcohol Use: No     Comment: former      Review of Systems ROS: Statement: All systems negative except as marked or noted in the HPI; Constitutional: Negative for fever and chills. ; ; Eyes: Negative for eye pain, redness and discharge. ; ; ENMT: Negative for ear pain, hoarseness, nasal congestion, sinus pressure and sore throat. ; ; Cardiovascular: Negative for chest pain, palpitations, diaphoresis, dyspnea and peripheral edema. ; ; Respiratory: Negative for cough, wheezing and stridor. ; ; Gastrointestinal: Negative for nausea, vomiting, diarrhea, abdominal pain, blood in stool, hematemesis, jaundice and rectal bleeding. . ; ; Genitourinary: Negative for dysuria, flank pain  and hematuria. ; ; Musculoskeletal: +left shoulder pain. Negative for back pain and neck pain. Negative for swelling and trauma.; ; Skin: Negative for pruritus, rash, abrasions, blisters, bruising and skin lesion.; ; Neuro: Negative for headache, lightheadedness and neck stiffness. Negative for weakness, altered level of consciousness , altered mental status, extremity weakness, paresthesias, involuntary movement, seizure and syncope.       Allergies  Review of patient's allergies indicates no known allergies.  Home Medications   Current Outpatient Rx  Name  Route  Sig  Dispense  Refill  . gabapentin (NEURONTIN) 600 MG tablet   Oral   Take 600 mg by mouth 3 (three) times daily.         Marland Kitchen HYDROcodone-acetaminophen (NORCO) 10-325 MG per tablet   Oral   Take 1 tablet by mouth every 6 (six) hours as needed. Pain         . ibuprofen (ADVIL,MOTRIN) 200 MG tablet   Oral   Take 800 mg by mouth every 6 (six) hours as needed. Pain         . Linaclotide (LINZESS) 290 MCG CAPS   Oral   Take 290 mcg by mouth daily.   90 capsule   3   . omeprazole (PRILOSEC) 20 MG capsule   Oral   Take 1 capsule (20 mg total) by mouth daily.   30 capsule   3   .  oxyCODONE-acetaminophen (PERCOCET/ROXICET) 5-325 MG per tablet   Oral   Take 1 tablet by mouth every 4 (four) hours as needed for pain.         . polyethylene glycol-electrolytes (TRILYTE) 420 G solution   Oral   Take 4,000 mLs by mouth as directed.   4000 mL   0     BP 145/90  Pulse 98  Temp(Src) 97.8 F (36.6 C) (Oral)  Resp 20  Ht 5\' 7"  (1.702 m)  Wt 145 lb (65.772 kg)  BMI 22.71 kg/m2  SpO2 100%  Physical Exam 0805: Physical examination:  Nursing notes reviewed; Vital signs and O2 SAT reviewed;  Constitutional: Well developed, Well nourished, Well hydrated, In no acute distress; Head:  Normocephalic, atraumatic; Eyes: EOMI, PERRL, No scleral icterus; ENMT: Mouth and pharynx normal, Mucous membranes moist; Neck:  Supple, Full range of motion, No lymphadenopathy; Cardiovascular: Regular rate and rhythm, No murmur, rub, or gallop; Respiratory: Breath sounds clear & equal bilaterally, No rales, rhonchi, wheezes.  Speaking full sentences with ease, Normal respiratory effort/excursion; Chest: Nontender, Movement normal; Abdomen: Soft, Nontender, Nondistended, Normal bowel sounds; Genitourinary: No CVA tenderness; Extremities: Pulses normal, +left shoulder w/FROM.  +generalized tenderness to palp entire joint.  Left clavicle NT, scapula NT, proximal humerus NT, biceps tendon NT over bicipital groove.  Motor strength at shoulder normal.  Sensation intact over deltoid region, distal NMS intact with left hand having intact and equal sensation and strength in the distribution of the median, radial, and ulnar nerve function compared to opposite side.  Strong radial pulse.  +FROM left elbow with intact motor strength biceps and triceps muscles to resistance. No deformity. No edema, No calf edema or asymmetry.; Neuro: AA&Ox3, Major CN grossly intact.  Speech clear. No gross focal motor or sensory deficits in extremities.; Skin: Color normal, Warm, Dry.   ED Course  Procedures     MDM  MDM Reviewed: previous chart, nursing note and vitals Interpretation: x-ray   Dg Shoulder Left 06/01/2012   *RADIOLOGY REPORT*  Clinical Data: Left shoulder pain  LEFT SHOULDER - 2+ VIEW  Comparison: None.  Findings: Three views of the left shoulder submitted.  No acute fracture or subluxation.  Old fracture of the left clavicle.  IMPRESSION: No acute fracture or subluxation.  Old fracture of the left clavicle.   Original Report Authenticated By: Natasha Mead, M.D.      Cricket.Hollow:  No acute findings on XR; will tx symptomatically at this time.  Pt states he needs "something stronger than motrin" and "another pain medicine prescription." When asked about his vicodin rx, pt states he "took more than usual of the pain meds for my back" and "ran out."   States he cannot get his usual narcotic pain med rx refilled for another week.  Informed that I will not rx more than a few days of narcotic pain meds, as he admittedly ran out because he wasn't taking the vicodin as prescribed. Pt verb understanding. Dx and testing d/w pt.  Questions answered.  Verb understanding, agreeable to d/c home with outpt f/u.         Laray Anger, DO 06/04/12 [phone]

## 2012-09-08 ENCOUNTER — Ambulatory Visit: Payer: Self-pay | Admitting: Gastroenterology

## 2012-09-20 ENCOUNTER — Telehealth: Payer: Self-pay | Admitting: Internal Medicine

## 2012-09-20 NOTE — Telephone Encounter (Signed)
Pt called to move his appointment up to something sooner and then asked how would he go about getting his 3 month supply of Linzess for free. I told him if we have samples to call us in advance before he runs out or we could send a rx to his pharmacy. He said he doesn't use a pharmacy that he gets the rx free from the company since he is unemployed. I told him I would ask our nurse and she would call him back. [phone]

## 2012-09-21 NOTE — Telephone Encounter (Signed)
Pt will need new rx written to fax to Patient assistance company

## 2012-09-22 MED ORDER — LINACLOTIDE 290 MCG PO CAPS: 290.0000 ug | ORAL_CAPSULE | Freq: Every day | ORAL | Status: DC

## 2012-09-22 NOTE — Telephone Encounter (Signed)
Printed

## 2012-09-26 ENCOUNTER — Encounter: Payer: Self-pay | Admitting: Internal Medicine

## 2012-09-27 ENCOUNTER — Encounter: Payer: Self-pay | Admitting: Gastroenterology

## 2012-09-27 ENCOUNTER — Ambulatory Visit (INDEPENDENT_AMBULATORY_CARE_PROVIDER_SITE_OTHER): Payer: Self-pay | Admitting: Gastroenterology

## 2012-09-27 ENCOUNTER — Other Ambulatory Visit: Payer: Self-pay | Admitting: Internal Medicine

## 2012-09-27 VITALS — BP 131/89 | HR 92 | Temp 96.7°F | Ht 67.0 in | Wt 153.4 lb

## 2012-09-27 DIAGNOSIS — G8929 Other chronic pain: Secondary | ICD-10-CM

## 2012-09-27 DIAGNOSIS — K219 Gastro-esophageal reflux disease without esophagitis: Secondary | ICD-10-CM

## 2012-09-27 DIAGNOSIS — R1013 Epigastric pain: Secondary | ICD-10-CM

## 2012-09-27 NOTE — Progress Notes (Signed)
Primary Care Physician:  SWAYNE,DAVID W, MD  Primary Gastroenterologist:  Michael Rourk, MD   Chief Complaint  Patient presents with  . Abdominal Pain    feels like a knot in his upper stomach    HPI:  Benjamin [name] is a 42 y.o. male here with complaints of epigastric pain. Seen in the office in April secondary to diagnosis of duodenal diverticulitis based on CT findings. 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 Started on Linzess 290 mcg daily. EGD not performed, patient's abdominal pain had completely resolved at that time.  Weight up 7 pounds. States his abdominal pain has settled down after his procedure. Over the last couple months he's had recurrent pain.  2-3 episodes in the past one month. Complains of epigastric pain which is disabling. We'll go to bed for 3 days. Not able to eat. No appetite. No vomiting. Feels knot in upper abdomen. Put hands on stomach causes pain. Some heartburn even on omeprazole. Eats TUMS, bottle in a day.TUMS helps. BM regular. Linzess helps. No melena, brbpr. No fever. Hydrocodone, 3-4 per day. Denies regular NSAID or aspirin use. Denies postprandial component to pain but is afraid to eat when he has the pain.    Current Outpatient Prescriptions  Medication Sig Dispense Refill  . gabapentin (NEURONTIN) 600 MG tablet Take 600 mg by mouth 3 (three) times daily.      . HYDROcodone-acetaminophen (NORCO) 10-325 MG per tablet Take 1 tablet by mouth every 6 (six) hours as needed. Pain      . ibuprofen (ADVIL,MOTRIN) 200 MG tablet Take 800 mg by mouth every 6 (six) hours as needed. Pain      . Linaclotide (LINZESS) 290 MCG CAPS capsule Take 1 capsule (290 mcg total) by mouth daily.  90 capsule  3  . omeprazole (PRILOSEC) 20 MG capsule Take 1 capsule (20 mg total) by mouth daily.  30 capsule  3   No current facility-administered medications for this visit.     Allergies as of 09/27/2012  . (No Known Allergies)    Past Medical History  Diagnosis Date  . Duodenal diverticulum   . Diverticulitis of duodenum   . Chronic back pain   . IBS (irritable bowel syndrome)   . H/O clavicle fracture     left  . Solitary kidney, congenital     Past Surgical History  Procedure Laterality Date  . Colonoscopy N/A 05/16/2012    RMR:Cecal polyp-tubular adenoma. TI 10cm normal. Next TCS 04/2017.    Family History  Problem Relation Age of Onset  . Colon cancer Neg Hx     History   Social History  . Marital Status: Single    Spouse Name: N/A    Number of Children: N/A  . Years of Education: N/A   Occupational History  . unemployed     trying to obtain disability   Social History Main Topics  . Smoking status: Current Every Day Smoker -- 1.00 packs/day    Types: Cigarettes  . Smokeless tobacco: Not on file     Comment: One pack per day  . Alcohol Use: No     Comment: former  . Drug Use: No  . Sexual Activity: No   Other Topics Concern  . Not on file   Social History Narrative  . No narrative on file      ROS:  General: Negative for weight loss,   fever, chills, fatigue, weakness. See history of present illness Eyes: Negative for vision changes.  ENT: Negative for hoarseness, difficulty swallowing , nasal congestion. CV: Negative for chest pain, angina, palpitations, dyspnea on exertion, peripheral edema.  Respiratory: Negative for dyspnea at rest, dyspnea on exertion, cough, sputum, wheezing.  GI: See history of present illness. GU:  Negative for dysuria, hematuria, urinary incontinence, urinary frequency, nocturnal urination.  MS: Negative for joint pain. Chronic low back pain.  Derm: Negative for rash or itching.  Neuro: Negative for weakness, abnormal sensation, seizure, frequent headaches, memory loss, confusion.  Psych: Negative for anxiety, depression, suicidal ideation, hallucinations.  Endo: Negative for unusual  weight change.  Heme: Negative for bruising or bleeding. Allergy: Negative for rash or hives.    Physical Examination:  BP 131/89  Pulse 92  Temp(Src) 96.7 F (35.9 C) (Oral)  Ht 5' 7" (1.702 m)  Wt 153 lb 6.4 oz (69.582 kg)  BMI 24.02 kg/m2   General: Well-nourished, well-developed in no acute distress.  Head: Normocephalic, atraumatic.   Eyes: Conjunctiva pink, no icterus. Mouth: Oropharyngeal mucosa moist and pink , no lesions erythema or exudate. Neck: Supple without thyromegaly, masses, or lymphadenopathy.  Lungs: Clear to auscultation bilaterally.  Heart: Regular rate and rhythm, no murmurs rubs or gallops.  Abdomen: Bowel sounds are normal, moderate epigastric tenderness, nondistended, no hepatosplenomegaly or masses, no abdominal bruits or    hernia , no rebound or guarding.   Rectal: Not performed Extremities: No lower extremity edema. No clubbing or deformities.  Neuro: Alert and oriented x 4 , grossly normal neurologically.  Skin: Warm and dry, no rash or jaundice.   Psych: Alert and cooperative, normal mood and affect.   

## 2012-09-27 NOTE — Assessment & Plan Note (Signed)
42 year old gentleman who presents with a recurrent upper abdominal pain, refractory heartburn on PPI. Limited NSAID use. Patient's history significant for duodenal diverticulitis in January 2014 based on CT findings. Treated successfully with antibiotics. Presents with a couple months of intermittent severe epigastric pain, debilitating when it occurs. At this point, would recommend upper endoscopy for further evaluation. Differential includes peptic ulcer disease, recurrent duodenal diverticulitis, less likely malignancy. Discussed with Dr. Jena Gauss. Plan on EGD in the near future. Augmentin conscious sedation with Phenergan 25 mg IV 30 minutes prior to procedure given history of chronic narcotic use.  I have discussed the risks, alternatives, benefits with regards to but not limited to the risk of reaction to medication, bleeding, infection, perforation and the patient is agreeable to proceed. Written consent to be obtained.

## 2012-09-27 NOTE — Patient Instructions (Addendum)
1. We have scheduled you for an upper endoscopy. Please see separate instructions.

## 2012-09-28 NOTE — Progress Notes (Signed)
CC'd to PCP 

## 2012-09-29 ENCOUNTER — Encounter (HOSPITAL_COMMUNITY)
Admission: RE | Disposition: A | Discharge status: Home / Self Care | Payer: Self-pay | Source: Ambulatory Visit | Attending: Internal Medicine

## 2012-09-29 ENCOUNTER — Encounter (HOSPITAL_COMMUNITY): Payer: Self-pay | Admitting: *Deleted

## 2012-09-29 ENCOUNTER — Ambulatory Visit (HOSPITAL_COMMUNITY)
Admission: RE | Admit: 2012-09-29 | Discharge: 2012-09-29 | Disposition: A | Discharge status: Home / Self Care | Payer: Self-pay | Source: Ambulatory Visit | Attending: Internal Medicine | Admitting: Internal Medicine

## 2012-09-29 ENCOUNTER — Telehealth: Payer: Self-pay | Admitting: Internal Medicine

## 2012-09-29 DIAGNOSIS — Z01812 Encounter for preprocedural laboratory examination: Secondary | ICD-10-CM | POA: Insufficient documentation

## 2012-09-29 DIAGNOSIS — K2100 Gastro-esophageal reflux disease with esophagitis, without bleeding: Secondary | ICD-10-CM | POA: Insufficient documentation

## 2012-09-29 DIAGNOSIS — K2210 Ulcer of esophagus without bleeding: Secondary | ICD-10-CM | POA: Insufficient documentation

## 2012-09-29 DIAGNOSIS — K219 Gastro-esophageal reflux disease without esophagitis: Secondary | ICD-10-CM

## 2012-09-29 DIAGNOSIS — K269 Duodenal ulcer, unspecified as acute or chronic, without hemorrhage or perforation: Secondary | ICD-10-CM

## 2012-09-29 DIAGNOSIS — R1013 Epigastric pain: Secondary | ICD-10-CM | POA: Insufficient documentation

## 2012-09-29 DIAGNOSIS — G8929 Other chronic pain: Secondary | ICD-10-CM

## 2012-09-29 HISTORY — DX: Gastro-esophageal reflux disease without esophagitis: K21.9

## 2012-09-29 HISTORY — DX: Headache: R51

## 2012-09-29 HISTORY — DX: Unspecified osteoarthritis, unspecified site: M19.90

## 2012-09-29 HISTORY — PX: ESOPHAGOGASTRODUODENOSCOPY: SHX5428

## 2012-09-29 SURGERY — EGD (ESOPHAGOGASTRODUODENOSCOPY)
Anesthesia: Moderate Sedation

## 2012-09-29 MED ORDER — MEPERIDINE HCL 100 MG/ML IJ SOLN
INTRAMUSCULAR | Status: AC
  Filled 2012-09-29: qty 2

## 2012-09-29 MED ORDER — SODIUM CHLORIDE 0.9 % IJ SOLN
INTRAMUSCULAR | Status: AC
  Filled 2012-09-29: qty 10

## 2012-09-29 MED ORDER — ONDANSETRON HCL 4 MG/2ML IJ SOLN
INTRAMUSCULAR | Status: AC
  Filled 2012-09-29: qty 2

## 2012-09-29 MED ORDER — MEPERIDINE HCL 100 MG/ML IJ SOLN
INTRAMUSCULAR | Status: DC | PRN
  Administered 2012-09-29 (×2): 50 mg via INTRAVENOUS

## 2012-09-29 MED ORDER — ONDANSETRON HCL 4 MG/2ML IJ SOLN
INTRAMUSCULAR | Status: DC | PRN
  Administered 2012-09-29: 4 mg via INTRAVENOUS

## 2012-09-29 MED ORDER — PROMETHAZINE HCL 25 MG/ML IJ SOLN
INTRAMUSCULAR | Status: AC
  Filled 2012-09-29: qty 1

## 2012-09-29 MED ORDER — PROMETHAZINE HCL 25 MG/ML IJ SOLN
25.0000 mg | Freq: Once | INTRAMUSCULAR | Status: AC
  Administered 2012-09-29: 25 mg via INTRAVENOUS

## 2012-09-29 MED ORDER — SODIUM CHLORIDE 0.9 % IV SOLN
INTRAVENOUS | Status: DC
  Administered 2012-09-29: 13:00:00 via INTRAVENOUS

## 2012-09-29 MED ORDER — MIDAZOLAM HCL 5 MG/5ML IJ SOLN
INTRAMUSCULAR | Status: AC
  Filled 2012-09-29: qty 10

## 2012-09-29 MED ORDER — BUTAMBEN-TETRACAINE-BENZOCAINE 2-2-14 % EX AERO
INHALATION_SPRAY | CUTANEOUS | Status: DC | PRN
  Administered 2012-09-29: 2 via TOPICAL

## 2012-09-29 MED ORDER — STERILE WATER FOR IRRIGATION IR SOLN
Status: DC | PRN
  Administered 2012-09-29: 13:00:00

## 2012-09-29 MED ORDER — MIDAZOLAM HCL 5 MG/5ML IJ SOLN
INTRAMUSCULAR | Status: DC | PRN
  Administered 2012-09-29: 1 mg via INTRAVENOUS
  Administered 2012-09-29 (×2): 2 mg via INTRAVENOUS

## 2012-09-29 NOTE — Telephone Encounter (Signed)
Assistance forms in the mail to the pt.

## 2012-09-29 NOTE — Op Note (Signed)
[facility] [address] [place], [address]   ENDOSCOPY PROCEDURE REPORT  PATIENT: Benjamin [name], Benjamin [name]  MR#: [id number] BIRTHDATE: [birth year] , 42  yrs. old GENDER: Male ENDOSCOPIST: R. Roetta Sessions, MD FACP FACG REFERRED BY:  Tally Joe, M.D. PROCEDURE DATE:  09/29/2012 PROCEDURE:     Diagnostic EGD  INDICATIONS:     epigastric pain  INFORMED CONSENT:   The risks, benefits, limitations, alternatives and imponderables have been discussed.  The potential for biopsy, esophogeal dilation, etc. have also been reviewed.  Questions have been answered.  All parties agreeable.  Please see the history and physical in the medical record for more information.  MEDICATIONS:    Versed 5 mg IV and Demerol 100 mg IV in divided doses. Phenergan 25 mg IV and Zofran 4 mg IV. Cetacaine spray.  DESCRIPTION OF PROCEDURE:   The EC-3890Li ([id number]) and EG-[id number] ([id number])  endoscope was introduced through the mouth and advanced to the second portion of the duodenum without difficulty or limitations.  The mucosal surfaces were surveyed very carefully during advancement of the scope and upon withdrawal.  Retroflexion view of the proximal stomach and esophagogastric junction was performed.      FINDINGS: "geographic" ulcerations/erosions of the distal 5 cm of tubular esophagus. No Barrett's esophagus. Stomach empty. Small hiatal hernia. Normal-appearing gastric mucosa. Patent pylorus. Examination of the duodenal bulb and second portion revealed a nearly 1 cm the bulbar ulcer in the apex with a smaller, 7 mm, ulcer on the opposite wall. These ulcers had  a clean bases.  THERAPEUTIC / DIAGNOSTIC MANEUVERS PERFORMED:  None   COMPLICATIONS:  None  IMPRESSION:   Severe ulcerative/erosive reflux esophagitis. Hiatal hernia. Multiple duodenal bulbar ulcers.  RECOMMENDATIONS:  Refrain from taking all nonsteroidal agents for now. Check H. pylori serologies. Stop Prilosec; begin  Dexilant 60 mg daily. Prescription provided. Also, patient is to stop by my office where a bag of free samples awais him. Office visit with [place] in 6 weeks.    _______________________________ R. Roetta Sessions, MD FACP [facility] eSigned:  R. Roetta Sessions, MD FACP [facility] 09/29/2012 1:59 PM     CC:

## 2012-09-29 NOTE — Telephone Encounter (Signed)
Pt wants [place] to mail the patient assistance forms to him so he can reapply in getting a 3 month supply of Linzess.

## 2012-09-29 NOTE — Interval H&P Note (Signed)
History and Physical Interval Note:  09/29/2012 1:13 PM  Benjamin [name]  has presented today for surgery, with the diagnosis of EPIGASTRIC PAIN AND GERD  The various methods of treatment have been discussed with the patient and family. After consideration of risks, benefits and other options for treatment, the patient has consented to  Procedure(s) with comments: ESOPHAGOGASTRODUODENOSCOPY (EGD) (N/A) - 12:45 as a surgical intervention .  The patient's history has been reviewed, patient examined, no change in status, stable for surgery.  I have reviewed the patient's chart and labs.  Questions were answered to the patient's satisfaction.     No change. EGD per plan.The risks, benefits, limitations, alternatives and imponderables have been reviewed with the patient. Potential for esophageal dilation, biopsy, etc. have also been reviewed.  Questions have been answered. All parties agreeable.  Eula Listen

## 2012-09-29 NOTE — H&P (View-Only) (Signed)
Primary Care Physician:  Sissy Hoff, MD  Primary Gastroenterologist:  Roetta Sessions, MD   Chief Complaint  Patient presents with  . Abdominal Pain    feels like a knot in his upper stomach    HPI:  Benjamin [name] is a 42 y.o. male here with complaints of epigastric pain. Seen in the office in April secondary to diagnosis of duodenal diverticulitis based on CT findings. 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 Started on Linzess 290 mcg daily. EGD not performed, patient's abdominal pain had completely resolved at that time.  Weight up 7 pounds. States his abdominal pain has settled down after his procedure. Over the last couple months he's had recurrent pain.  2-3 episodes in the past one month. Complains of epigastric pain which is disabling. We'll go to bed for 3 days. Not able to eat. No appetite. No vomiting. Feels knot in upper abdomen. Put hands on stomach causes pain. Some heartburn even on omeprazole. Eats TUMS, bottle in a day.TUMS helps. BM regular. Linzess helps. No melena, brbpr. No fever. Hydrocodone, 3-4 per day. Denies regular NSAID or aspirin use. Denies postprandial component to pain but is afraid to eat when he has the pain.    Current Outpatient Prescriptions  Medication Sig Dispense Refill  . gabapentin (NEURONTIN) 600 MG tablet Take 600 mg by mouth 3 (three) times daily.      Marland Kitchen HYDROcodone-acetaminophen (NORCO) 10-325 MG per tablet Take 1 tablet by mouth every 6 (six) hours as needed. Pain      . ibuprofen (ADVIL,MOTRIN) 200 MG tablet Take 800 mg by mouth every 6 (six) hours as needed. Pain      . Linaclotide (LINZESS) 290 MCG CAPS capsule Take 1 capsule (290 mcg total) by mouth daily.  90 capsule  3  . omeprazole (PRILOSEC) 20 MG capsule Take 1 capsule (20 mg total) by mouth daily.  30 capsule  3   No current facility-administered medications for this visit.     Allergies as of 09/27/2012  . (No Known Allergies)    Past Medical History  Diagnosis Date  . Duodenal diverticulum   . Diverticulitis of duodenum   . Chronic back pain   . IBS (irritable bowel syndrome)   . H/O clavicle fracture     left  . Solitary kidney, congenital     Past Surgical History  Procedure Laterality Date  . Colonoscopy N/A 05/16/2012    [id number] polyp-tubular adenoma. TI 10cm normal. Next TCS 04/2017.    Family History  Problem Relation Age of Onset  . Colon cancer Neg Hx     History   Social History  . Marital Status: Single    Spouse Name: N/A    Number of Children: N/A  . Years of Education: N/A   Occupational History  . unemployed     trying to obtain disability   Social History Main Topics  . Smoking status: Current Every Day Smoker -- 1.00 packs/day    Types: Cigarettes  . Smokeless tobacco: Not on file     Comment: One pack per day  . Alcohol Use: No     Comment: former  . Drug Use: No  . Sexual Activity: No   Other Topics Concern  . Not on file   Social History Narrative  . No narrative on file      ROS:  General: Negative for weight loss,  fever, chills, fatigue, weakness. See history of present illness Eyes: Negative for vision changes.  ENT: Negative for hoarseness, difficulty swallowing , nasal congestion. CV: Negative for chest pain, angina, palpitations, dyspnea on exertion, peripheral edema.  Respiratory: Negative for dyspnea at rest, dyspnea on exertion, cough, sputum, wheezing.  GI: See history of present illness. GU:  Negative for dysuria, hematuria, urinary incontinence, urinary frequency, nocturnal urination.  MS: Negative for joint pain. Chronic low back pain.  Derm: Negative for rash or itching.  Neuro: Negative for weakness, abnormal sensation, seizure, frequent headaches, memory loss, confusion.  Psych: Negative for anxiety, depression, suicidal ideation, hallucinations.  Endo: Negative for unusual  weight change.  Heme: Negative for bruising or bleeding. Allergy: Negative for rash or hives.    Physical Examination:  BP 131/89  Pulse 92  Temp(Src) 96.7 F (35.9 C) (Oral)  Ht 5\' 7"  (1.702 m)  Wt 153 lb 6.4 oz (69.582 kg)  BMI 24.02 kg/m2   General: Well-nourished, well-developed in no acute distress.  Head: Normocephalic, atraumatic.   Eyes: Conjunctiva pink, no icterus. Mouth: Oropharyngeal mucosa moist and pink , no lesions erythema or exudate. Neck: Supple without thyromegaly, masses, or lymphadenopathy.  Lungs: Clear to auscultation bilaterally.  Heart: Regular rate and rhythm, no murmurs rubs or gallops.  Abdomen: Bowel sounds are normal, moderate epigastric tenderness, nondistended, no hepatosplenomegaly or masses, no abdominal bruits or    hernia , no rebound or guarding.   Rectal: Not performed Extremities: No lower extremity edema. No clubbing or deformities.  Neuro: Alert and oriented x 4 , grossly normal neurologically.  Skin: Warm and dry, no rash or jaundice.   Psych: Alert and cooperative, normal mood and affect.

## 2012-09-30 ENCOUNTER — Encounter (HOSPITAL_COMMUNITY): Payer: Self-pay | Admitting: Internal Medicine

## 2012-09-30 LAB — H. PYLORI ANTIBODY, IGG: H Pylori IgG: <0.4 {ISR}

## 2012-10-05 ENCOUNTER — Ambulatory Visit: Payer: Self-pay | Admitting: Gastroenterology

## 2012-11-14 ENCOUNTER — Encounter (INDEPENDENT_AMBULATORY_CARE_PROVIDER_SITE_OTHER): Payer: Self-pay

## 2012-11-14 ENCOUNTER — Encounter: Payer: Self-pay | Admitting: Gastroenterology

## 2012-11-14 ENCOUNTER — Ambulatory Visit (INDEPENDENT_AMBULATORY_CARE_PROVIDER_SITE_OTHER): Payer: Self-pay | Admitting: Gastroenterology

## 2012-11-14 VITALS — BP 119/77 | HR 84 | Temp 97.9°F | Wt 155.8 lb

## 2012-11-14 DIAGNOSIS — K5900 Constipation, unspecified: Secondary | ICD-10-CM

## 2012-11-14 DIAGNOSIS — K269 Duodenal ulcer, unspecified as acute or chronic, without hemorrhage or perforation: Secondary | ICD-10-CM

## 2012-11-14 MED ORDER — DEXLANSOPRAZOLE 60 MG PO CPDR: 60.0000 mg | DELAYED_RELEASE_CAPSULE | Freq: Every day | ORAL | Status: DC

## 2012-11-14 NOTE — Progress Notes (Signed)
Referring Provider: Sissy Hoff, MD Primary Care Physician:  Sissy Hoff, MD Primary GI: Dr. Jena Gauss   Chief Complaint  Patient presents with  . Follow-up    HPI:   Mr. Bobier presents today in follow-up after EGD. Noted to have severe ulcerative/erosive reflux esophagitis. Hiatal hernia. Multiple duodenal bulbar ulcers. Negative H.pylori serology. History of chronic NSAID use for back; none since the procedure. Can't put pressure on belly, starts stomach growling. Much improved. Hasn't taken Linzess in about 2 days. Filled paperwork out but hasn't received reply. Out of Dexilant as well. Weight stable, actually improved.    Past Medical History  Diagnosis Date  . Duodenal diverticulum   . Diverticulitis of duodenum   . Chronic back pain   . IBS (irritable bowel syndrome)   . H/O clavicle fracture     left  . Solitary kidney, congenital   . GERD (gastroesophageal reflux disease)   . Headache(784.0)   . Arthritis     back    Past Surgical History  Procedure Laterality Date  . Colonoscopy N/A 05/16/2012    [id number] polyp-tubular adenoma. TI 10cm normal. Next TCS 04/2017.  Marland Kitchen Esophagogastroduodenoscopy N/A 09/29/2012    [id number] ulcerative/erosive reflux esophagitis. Hiatal hernia. Multiple duodenal bulbar ulcers. Negative H.pylori serology    Current Outpatient Prescriptions  Medication Sig Dispense Refill  . gabapentin (NEURONTIN) 600 MG tablet Take 600 mg by mouth 3 (three) times daily.      Marland Kitchen HYDROcodone-acetaminophen (NORCO) 10-325 MG per tablet Take 1 tablet by mouth every 6 (six) hours as needed. Pain      . Linaclotide (LINZESS) 290 MCG CAPS capsule Take 1 capsule (290 mcg total) by mouth daily.  90 capsule  3   No current facility-administered medications for this visit.    Allergies as of 11/14/2012  . (No Known Allergies)    Family History  Problem Relation Age of Onset  . Colon cancer Neg Hx     History   Social History  . Marital Status: Single     Spouse Name: N/A    Number of Children: N/A  . Years of Education: N/A   Occupational History  . unemployed     trying to obtain disability   Social History Main Topics  . Smoking status: Current Every Day Smoker -- 1.00 packs/day    Types: Cigarettes  . Smokeless tobacco: None     Comment: One pack per day  . Alcohol Use: No     Comment: former, heavy for 10 years  . Drug Use: No  . Sexual Activity: No   Other Topics Concern  . None   Social History Narrative  . None    Review of Systems: As mentioned in HPI  Physical Exam: BP 119/77  Pulse 84  Temp(Src) 97.9 F (36.6 C) (Oral)  Wt 155 lb 12.8 oz (70.67 kg)  BMI 24.4 kg/m2 General:   Alert and oriented. No distress noted. Pleasant and cooperative.  Head:  Normocephalic and atraumatic. Eyes:  Conjuctiva clear without scleral icterus. Mouth:  Oral mucosa pink and moist. Good dentition. No lesions. Heart:  S1, S2 present without murmurs, rubs, or gallops. Regular rate and rhythm. Abdomen:  +BS, soft, non-tender and non-distended. No rebound or guarding. No HSM or masses noted. Msk:  Symmetrical without gross deformities. Normal posture. Extremities:  Without edema. Neurologic:  Alert and  oriented x4;  grossly normal neurologically. Skin:  Intact without significant lesions or rashes Psych:  Alert and cooperative. Normal mood  and affect.

## 2012-11-14 NOTE — Patient Instructions (Signed)
Continue to take Linzess 290 mcg daily, 30 minutes before breakfast. We have provided samples and will find out the status of the assistance.  Start taking Dexilant once daily. We have provided samples and patient assistance forms.   Please call us if you do not hear soon about the assistance.   We will see you in 6 months!   Continue to avoid any Ibuprofen, Advil, Aleve, Motrin, BC powders, Goody powders.

## 2012-11-16 DIAGNOSIS — K269 Duodenal ulcer, unspecified as acute or chronic, without hemorrhage or perforation: Secondary | ICD-10-CM | POA: Insufficient documentation

## 2012-11-16 NOTE — Assessment & Plan Note (Addendum)
Status post EGD with negative H.pylori serology. Secondary to NSAID use, which he now has ceased taking. Symptoms improved significantly. Continue Dexilant daily; provide patient assistance forms for Dexilant. Strict avoidance of NSAIDs. Return in 6 months.

## 2012-11-16 NOTE — Assessment & Plan Note (Signed)
Continue Linzess 290 mcg daily. Assistance forms in process. Return in 6 months.

## 2012-11-21 NOTE — Progress Notes (Signed)
cc'd to pcp 

## 2012-12-01 ENCOUNTER — Telehealth: Payer: Self-pay | Admitting: Internal Medicine

## 2012-12-01 NOTE — Telephone Encounter (Signed)
Patient came to front office window asking for Dexilant samples

## 2012-12-02 NOTE — Telephone Encounter (Signed)
I gave him two boxes of Dexilant

## 2013-01-18 ENCOUNTER — Emergency Department (HOSPITAL_COMMUNITY): Payer: Medicaid Other

## 2013-01-18 ENCOUNTER — Inpatient Hospital Stay (HOSPITAL_COMMUNITY)
Admission: EM | Admit: 2013-01-18 | Discharge: 2013-01-23 | DRG: 557 | Disposition: A | Discharge status: Home Health | Payer: Medicaid Other | Attending: Internal Medicine | Admitting: Internal Medicine

## 2013-01-18 ENCOUNTER — Encounter (HOSPITAL_COMMUNITY): Payer: Self-pay | Admitting: Emergency Medicine

## 2013-01-18 DIAGNOSIS — Q602 Renal agenesis, unspecified: Secondary | ICD-10-CM

## 2013-01-18 DIAGNOSIS — K661 Hemoperitoneum: Secondary | ICD-10-CM

## 2013-01-18 DIAGNOSIS — F3289 Other specified depressive episodes: Secondary | ICD-10-CM | POA: Diagnosis present

## 2013-01-18 DIAGNOSIS — G8929 Other chronic pain: Secondary | ICD-10-CM | POA: Diagnosis present

## 2013-01-18 DIAGNOSIS — K589 Irritable bowel syndrome without diarrhea: Secondary | ICD-10-CM | POA: Diagnosis present

## 2013-01-18 DIAGNOSIS — K219 Gastro-esophageal reflux disease without esophagitis: Secondary | ICD-10-CM | POA: Diagnosis present

## 2013-01-18 DIAGNOSIS — F329 Major depressive disorder, single episode, unspecified: Secondary | ICD-10-CM | POA: Diagnosis present

## 2013-01-18 DIAGNOSIS — E872 Acidosis, unspecified: Secondary | ICD-10-CM | POA: Diagnosis not present

## 2013-01-18 DIAGNOSIS — M6282 Rhabdomyolysis: Principal | ICD-10-CM | POA: Diagnosis present

## 2013-01-18 DIAGNOSIS — M549 Dorsalgia, unspecified: Secondary | ICD-10-CM | POA: Diagnosis present

## 2013-01-18 DIAGNOSIS — R1013 Epigastric pain: Secondary | ICD-10-CM

## 2013-01-18 DIAGNOSIS — S0003XA Contusion of scalp, initial encounter: Secondary | ICD-10-CM | POA: Diagnosis present

## 2013-01-18 DIAGNOSIS — K5900 Constipation, unspecified: Secondary | ICD-10-CM

## 2013-01-18 DIAGNOSIS — S022XXA Fracture of nasal bones, initial encounter for closed fracture: Secondary | ICD-10-CM | POA: Diagnosis present

## 2013-01-18 DIAGNOSIS — F17200 Nicotine dependence, unspecified, uncomplicated: Secondary | ICD-10-CM | POA: Diagnosis present

## 2013-01-18 DIAGNOSIS — T07XXXA Unspecified multiple injuries, initial encounter: Secondary | ICD-10-CM

## 2013-01-18 DIAGNOSIS — R748 Abnormal levels of other serum enzymes: Secondary | ICD-10-CM | POA: Diagnosis present

## 2013-01-18 DIAGNOSIS — K269 Duodenal ulcer, unspecified as acute or chronic, without hemorrhage or perforation: Secondary | ICD-10-CM

## 2013-01-18 DIAGNOSIS — J209 Acute bronchitis, unspecified: Secondary | ICD-10-CM | POA: Diagnosis not present

## 2013-01-18 DIAGNOSIS — J189 Pneumonia, unspecified organism: Secondary | ICD-10-CM | POA: Diagnosis not present

## 2013-01-18 DIAGNOSIS — Z23 Encounter for immunization: Secondary | ICD-10-CM

## 2013-01-18 DIAGNOSIS — S301XXA Contusion of abdominal wall, initial encounter: Secondary | ICD-10-CM | POA: Diagnosis present

## 2013-01-18 HISTORY — DX: Acute gastric ulcer without hemorrhage or perforation: K25.3

## 2013-01-18 HISTORY — DX: Anxiety disorder, unspecified: F41.9

## 2013-01-18 HISTORY — DX: Reserved for concepts with insufficient information to code with codable children: IMO0002

## 2013-01-18 HISTORY — DX: Major depressive disorder, single episode, unspecified: F32.9

## 2013-01-18 HISTORY — DX: Depression, unspecified: F32.A

## 2013-01-18 LAB — URINALYSIS, ROUTINE W REFLEX MICROSCOPIC
Leukocytes, UA: NEGATIVE
Nitrite: NEGATIVE
Specific Gravity, Urine: 1.011 (ref 1.005–1.030)
pH: 5.5 (ref 5.0–8.0)

## 2013-01-18 LAB — RAPID URINE DRUG SCREEN, HOSP PERFORMED
Cocaine: POSITIVE — AB
Opiates: NOT DETECTED

## 2013-01-18 LAB — CBC WITH DIFFERENTIAL/PLATELET
Basophils Absolute: 0 10*3/uL (ref 0.0–0.1)
Eosinophils Absolute: 0 10*3/uL (ref 0.0–0.7)
Eosinophils Relative: 0 % (ref 0–5)
Lymphocytes Relative: 8 % — ABNORMAL LOW (ref 12–46)
MCV: 87.9 fL (ref 78.0–100.0)
Neutrophils Relative %: 86 % — ABNORMAL HIGH (ref 43–77)
Platelets: 283 10*3/uL (ref 150–400)
RDW: 13.2 % (ref 11.5–15.5)
WBC: 16.1 10*3/uL — ABNORMAL HIGH (ref 4.0–10.5)

## 2013-01-18 LAB — COMPREHENSIVE METABOLIC PANEL
ALT: 22 U/L (ref 0–53)
AST: 39 U/L — ABNORMAL HIGH (ref 0–37)
CO2: 20 mEq/L (ref 19–32)
Calcium: 8.9 mg/dL (ref 8.4–10.5)
Potassium: 4.2 mEq/L (ref 3.7–5.3)
Sodium: 138 mEq/L (ref 137–147)
Total Protein: 7.4 g/dL (ref 6.0–8.3)

## 2013-01-18 LAB — LIPASE, BLOOD: Lipase: 11 U/L (ref 11–59)

## 2013-01-18 LAB — CK: Total CK: 1588 U/L — ABNORMAL HIGH (ref 7–232)

## 2013-01-18 LAB — ETHANOL: Alcohol, Ethyl (B): <11 mg/dL (ref 0–11)

## 2013-01-18 MED ORDER — SODIUM CHLORIDE 0.9 % IV SOLN
INTRAVENOUS | Status: DC
  Administered 2013-01-18: 10:00:00 via INTRAVENOUS

## 2013-01-18 MED ORDER — IOHEXOL 300 MG/ML  SOLN
100.0000 mL | Freq: Once | INTRAMUSCULAR | Status: AC | PRN
  Administered 2013-01-18: 100 mL via INTRAVENOUS

## 2013-01-18 MED ORDER — HYDROMORPHONE HCL PF 1 MG/ML IJ SOLN
1.0000 mg | Freq: Once | INTRAMUSCULAR | Status: AC
  Administered 2013-01-18: 1 mg via INTRAVENOUS
  Filled 2013-01-18: qty 1

## 2013-01-18 MED ORDER — SODIUM CHLORIDE 0.9 % IV SOLN
INTRAVENOUS | Status: DC
  Administered 2013-01-18 – 2013-01-21 (×8): via INTRAVENOUS

## 2013-01-18 MED ORDER — FENTANYL CITRATE 0.05 MG/ML IJ SOLN
100.0000 ug | Freq: Once | INTRAMUSCULAR | Status: AC
  Administered 2013-01-18: 100 ug via INTRAVENOUS
  Filled 2013-01-18: qty 2

## 2013-01-18 MED ORDER — HYDROCODONE-ACETAMINOPHEN 5-325 MG PO TABS
1.0000 | ORAL_TABLET | ORAL | Status: DC | PRN
  Administered 2013-01-18 – 2013-01-20 (×8): 2 via ORAL
  Filled 2013-01-18 (×8): qty 2

## 2013-01-18 MED ORDER — SODIUM CHLORIDE 0.9 % IV SOLN: INTRAVENOUS | Status: DC

## 2013-01-18 MED ORDER — HYDROMORPHONE HCL PF 1 MG/ML IJ SOLN
1.0000 mg | INTRAMUSCULAR | Status: AC | PRN
  Administered 2013-01-18 – 2013-01-19 (×3): 1 mg via INTRAVENOUS
  Filled 2013-01-18 (×2): qty 1

## 2013-01-18 MED ORDER — TETANUS-DIPHTH-ACELL PERTUSSIS 5-2.5-18.5 LF-MCG/0.5 IM SUSP
0.5000 mL | Freq: Once | INTRAMUSCULAR | Status: AC
  Administered 2013-01-18: 0.5 mL via INTRAMUSCULAR
  Filled 2013-01-18: qty 0.5

## 2013-01-18 MED ORDER — INFLUENZA VAC SPLIT QUAD 0.5 ML IM SUSP
0.5000 mL | INTRAMUSCULAR | Status: DC
  Filled 2013-01-18: qty 0.5

## 2013-01-18 MED ORDER — SODIUM CHLORIDE 0.9 % IV BOLUS (SEPSIS)
1000.0000 mL | Freq: Once | INTRAVENOUS | Status: AC
  Administered 2013-01-18: 1000 mL via INTRAVENOUS

## 2013-01-18 MED ORDER — PNEUMOCOCCAL VAC POLYVALENT 25 MCG/0.5ML IJ INJ
0.5000 mL | INJECTION | INTRAMUSCULAR | Status: DC
  Filled 2013-01-18: qty 0.5

## 2013-01-18 MED ORDER — GABAPENTIN 600 MG PO TABS
600.0000 mg | ORAL_TABLET | Freq: Three times a day (TID) | ORAL | Status: DC
  Administered 2013-01-18 – 2013-01-23 (×15): 600 mg via ORAL
  Filled 2013-01-18 (×20): qty 1

## 2013-01-18 MED ORDER — HYDROMORPHONE HCL PF 1 MG/ML IJ SOLN
INTRAMUSCULAR | Status: AC
  Filled 2013-01-18: qty 1

## 2013-01-18 MED ORDER — PANTOPRAZOLE SODIUM 40 MG PO TBEC
40.0000 mg | DELAYED_RELEASE_TABLET | Freq: Every day | ORAL | Status: DC
  Administered 2013-01-18 – 2013-01-22 (×5): 40 mg via ORAL
  Filled 2013-01-18 (×5): qty 1

## 2013-01-18 MED ORDER — LINACLOTIDE 290 MCG PO CAPS
290.0000 ug | ORAL_CAPSULE | Freq: Every day | ORAL | Status: DC
  Administered 2013-01-19 – 2013-01-23 (×4): 290 ug via ORAL
  Filled 2013-01-18 (×6): qty 1
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
## 2013-01-18 NOTE — Progress Notes (Signed)
Patient admitted to 6n03 in stable condition. Mother at bedside. Oriented to unit/room. Callbell placed within reach.

## 2013-01-18 NOTE — ED Notes (Signed)
[employer] informed of patient. Will return to speak with pt after xrays are completed.

## 2013-01-18 NOTE — ED Notes (Signed)
Dr. Fonnie Jarvis at bedside to speak with patient and family.
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
## 2013-01-18 NOTE — ED Notes (Signed)
Pt returned to exam room from x-ray. Vital signs stable. Family at bedside. No signs of acute distress noted. Pt remains alert and oriented x4.

## 2013-01-19 DIAGNOSIS — T07XXXA Unspecified multiple injuries, initial encounter: Secondary | ICD-10-CM

## 2013-01-19 LAB — BASIC METABOLIC PANEL
BUN: 14 mg/dL (ref 6–23)
CO2: 19 mEq/L (ref 19–32)
Calcium: 8.5 mg/dL (ref 8.4–10.5)
Chloride: 109 mEq/L (ref 96–112)
Creatinine, Ser: 1.03 mg/dL (ref 0.50–1.35)
GFR calc Af Amer: >90 mL/min (ref >90)
GFR calc non Af Amer: 88 mL/min — ABNORMAL LOW (ref >90)
Glucose, Bld: 94 mg/dL (ref 70–99)
Potassium: 3.9 mEq/L (ref 3.7–5.3)
Sodium: 142 mEq/L (ref 137–147)

## 2013-01-19 LAB — CBC
HCT: 40.6 % (ref 39.0–52.0)
Hemoglobin: 14.3 g/dL (ref 13.0–17.0)
MCH: 31.4 pg (ref 26.0–34.0)
MCHC: 35.2 g/dL (ref 30.0–36.0)
MCV: 89 fL (ref 78.0–100.0)
PLATELETS: 217 10*3/uL (ref 150–400)
RBC: 4.56 MIL/uL (ref 4.22–5.81)
RDW: 13.5 % (ref 11.5–15.5)
WBC: 11.8 10*3/uL — ABNORMAL HIGH (ref 4.0–10.5)

## 2013-01-19 LAB — CK: Total CK: 1529 U/L — ABNORMAL HIGH (ref 7–232)
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
## 2013-01-20 DIAGNOSIS — Z23 Encounter for immunization: Secondary | ICD-10-CM | POA: Diagnosis not present

## 2013-01-20 DIAGNOSIS — J209 Acute bronchitis, unspecified: Secondary | ICD-10-CM | POA: Diagnosis not present

## 2013-01-20 DIAGNOSIS — F17200 Nicotine dependence, unspecified, uncomplicated: Secondary | ICD-10-CM | POA: Diagnosis present

## 2013-01-20 DIAGNOSIS — Q602 Renal agenesis, unspecified: Secondary | ICD-10-CM | POA: Diagnosis not present

## 2013-01-20 DIAGNOSIS — S1093XA Contusion of unspecified part of neck, initial encounter: Secondary | ICD-10-CM | POA: Diagnosis present

## 2013-01-20 DIAGNOSIS — J189 Pneumonia, unspecified organism: Secondary | ICD-10-CM | POA: Diagnosis not present

## 2013-01-20 DIAGNOSIS — K219 Gastro-esophageal reflux disease without esophagitis: Secondary | ICD-10-CM | POA: Diagnosis present

## 2013-01-20 DIAGNOSIS — R748 Abnormal levels of other serum enzymes: Secondary | ICD-10-CM | POA: Diagnosis not present

## 2013-01-20 DIAGNOSIS — S022XXA Fracture of nasal bones, initial encounter for closed fracture: Secondary | ICD-10-CM | POA: Diagnosis present

## 2013-01-20 DIAGNOSIS — M549 Dorsalgia, unspecified: Secondary | ICD-10-CM | POA: Diagnosis present

## 2013-01-20 DIAGNOSIS — M6282 Rhabdomyolysis: Secondary | ICD-10-CM | POA: Diagnosis not present

## 2013-01-20 DIAGNOSIS — K589 Irritable bowel syndrome without diarrhea: Secondary | ICD-10-CM | POA: Diagnosis present

## 2013-01-20 DIAGNOSIS — G8929 Other chronic pain: Secondary | ICD-10-CM | POA: Diagnosis present

## 2013-01-20 DIAGNOSIS — F3289 Other specified depressive episodes: Secondary | ICD-10-CM | POA: Diagnosis present

## 2013-01-20 DIAGNOSIS — F329 Major depressive disorder, single episode, unspecified: Secondary | ICD-10-CM | POA: Diagnosis present

## 2013-01-20 DIAGNOSIS — S301XXA Contusion of abdominal wall, initial encounter: Secondary | ICD-10-CM | POA: Diagnosis present

## 2013-01-20 DIAGNOSIS — S0003XA Contusion of scalp, initial encounter: Secondary | ICD-10-CM | POA: Diagnosis present

## 2013-01-20 DIAGNOSIS — Q605 Renal hypoplasia, unspecified: Secondary | ICD-10-CM | POA: Diagnosis not present

## 2013-01-20 DIAGNOSIS — E872 Acidosis, unspecified: Secondary | ICD-10-CM | POA: Diagnosis not present

## 2013-01-20 LAB — BASIC METABOLIC PANEL
BUN: 12 mg/dL (ref 6–23)
CO2: 16 mEq/L — ABNORMAL LOW (ref 19–32)
Calcium: 8.3 mg/dL — ABNORMAL LOW (ref 8.4–10.5)
Chloride: 109 mEq/L (ref 96–112)
Creatinine, Ser: 0.91 mg/dL (ref 0.50–1.35)
GFR calc Af Amer: >90 mL/min (ref >90)
GLUCOSE: 94 mg/dL (ref 70–99)
Potassium: 3.8 mEq/L (ref 3.7–5.3)
Sodium: 143 mEq/L (ref 137–147)

## 2013-01-20 LAB — CBC
HEMATOCRIT: 41.2 % (ref 39.0–52.0)
Hemoglobin: 14 g/dL (ref 13.0–17.0)
MCH: 30.4 pg (ref 26.0–34.0)
MCHC: 34 g/dL (ref 30.0–36.0)
MCV: 89.4 fL (ref 78.0–100.0)
Platelets: 227 10*3/uL (ref 150–400)
RBC: 4.61 MIL/uL (ref 4.22–5.81)
RDW: 13.4 % (ref 11.5–15.5)
WBC: 11.7 10*3/uL — ABNORMAL HIGH (ref 4.0–10.5)

## 2013-01-20 LAB — CK: Total CK: 1231 U/L — ABNORMAL HIGH (ref 7–232)

## 2013-01-20 MED ORDER — DM-GUAIFENESIN ER 30-600 MG PO TB12
1.0000 | ORAL_TABLET | Freq: Two times a day (BID) | ORAL | Status: DC
  Administered 2013-01-20 – 2013-01-23 (×6): 1 via ORAL
  Filled 2013-01-20 (×7): qty 1

## 2013-01-20 MED ORDER — LEVALBUTEROL HCL 0.63 MG/3ML IN NEBU
0.6300 mg | INHALATION_SOLUTION | Freq: Three times a day (TID) | RESPIRATORY_TRACT | Status: DC
  Filled 2013-01-20: qty 3

## 2013-01-20 MED ORDER — LEVALBUTEROL HCL 0.63 MG/3ML IN NEBU
0.6300 mg | INHALATION_SOLUTION | RESPIRATORY_TRACT | Status: DC | PRN
  Administered 2013-01-21: 0.63 mg via RESPIRATORY_TRACT
  Filled 2013-01-20: qty 3

## 2013-01-20 MED ORDER — HYDROMORPHONE HCL 2 MG PO TABS
1.0000 mg | ORAL_TABLET | ORAL | Status: DC | PRN
  Administered 2013-01-20 – 2013-01-23 (×19): 2 mg via ORAL
  Filled 2013-01-20 (×19): qty 1

## 2013-01-20 MED ORDER — BENZONATATE 100 MG PO CAPS
200.0000 mg | ORAL_CAPSULE | Freq: Three times a day (TID) | ORAL | Status: DC | PRN
  Administered 2013-01-20 – 2013-01-22 (×3): 200 mg via ORAL
  Filled 2013-01-20 (×3): qty 2

## 2013-01-20 MED ORDER — KETOROLAC TROMETHAMINE 30 MG/ML IJ SOLN
30.0000 mg | Freq: Once | INTRAMUSCULAR | Status: AC
  Administered 2013-01-20: 30 mg via INTRAVENOUS
  Filled 2013-01-20: qty 1

## 2013-01-20 MED ORDER — HYDROCORTISONE 1 % EX CREA
1.0000 "application " | TOPICAL_CREAM | Freq: Two times a day (BID) | CUTANEOUS | Status: DC
  Administered 2013-01-20 – 2013-01-23 (×7): 1 via TOPICAL
  Filled 2013-01-20 (×2): qty 28

## 2013-01-20 NOTE — Progress Notes (Signed)
Patient continue to c/o of pain, Md made aware,new order was given and noted.

## 2013-01-20 NOTE — Progress Notes (Signed)
Pt. Has a red raised rash to his back. Pt. And mother states that it is a new concern.
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
## 2013-01-20 NOTE — Progress Notes (Signed)
Encourage patient to get OOB multiple times by this Probation officer and staff but refused. Will continue to assist patient .
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
## 2013-01-20 NOTE — Progress Notes (Signed)
OT Cancellation Note  Patient Details Name: Benjamin [name] MRN: [id number] DOB: Aug 23, 1970   Cancelled Treatment:    Reason Eval/Treat Not Completed: Pain limiting ability to participate;Fatigue/lethargy limiting ability to participate  [place], [place] 01/20/2013, 1:37 PM
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
## 2013-01-21 DIAGNOSIS — K219 Gastro-esophageal reflux disease without esophagitis: Secondary | ICD-10-CM

## 2013-01-21 DIAGNOSIS — M549 Dorsalgia, unspecified: Secondary | ICD-10-CM

## 2013-01-21 LAB — BASIC METABOLIC PANEL
BUN: 12 mg/dL (ref 6–23)
CO2: 15 mEq/L — ABNORMAL LOW (ref 19–32)
CREATININE: 0.86 mg/dL (ref 0.50–1.35)
Calcium: 8.2 mg/dL — ABNORMAL LOW (ref 8.4–10.5)
Chloride: 110 mEq/L (ref 96–112)
GFR calc Af Amer: >90 mL/min (ref >90)
GFR calc non Af Amer: >90 mL/min (ref >90)
Glucose, Bld: 97 mg/dL (ref 70–99)
Potassium: 3.8 mEq/L (ref 3.7–5.3)
Sodium: 142 mEq/L (ref 137–147)

## 2013-01-21 LAB — URINALYSIS, ROUTINE W REFLEX MICROSCOPIC
Bilirubin Urine: NEGATIVE
GLUCOSE, UA: NEGATIVE mg/dL
Hgb urine dipstick: NEGATIVE
Ketones, ur: >80 mg/dL — AB
Leukocytes, UA: NEGATIVE
Nitrite: NEGATIVE
PROTEIN: NEGATIVE mg/dL
Specific Gravity, Urine: 1.018 (ref 1.005–1.030)
Urobilinogen, UA: 0.2 mg/dL (ref 0.0–1.0)
pH: 5.5 (ref 5.0–8.0)

## 2013-01-21 LAB — CK: Total CK: 767 U/L — ABNORMAL HIGH (ref 7–232)

## 2013-01-21 LAB — LACTIC ACID, PLASMA: Lactic Acid, Venous: 1 mmol/L (ref 0.5–2.2)

## 2013-01-21 MED ORDER — HYDROMORPHONE HCL 2 MG PO TABS
ORAL_TABLET | ORAL | Status: AC
  Filled 2013-01-21: qty 1

## 2013-01-21 MED ORDER — DEXTROSE-NACL 5-0.9 % IV SOLN
INTRAVENOUS | Status: DC
  Administered 2013-01-21 – 2013-01-22 (×3): via INTRAVENOUS

## 2013-01-21 MED ORDER — METHOCARBAMOL 500 MG PO TABS
750.0000 mg | ORAL_TABLET | Freq: Four times a day (QID) | ORAL | Status: DC | PRN
  Administered 2013-01-21 – 2013-01-23 (×8): 750 mg via ORAL
  Filled 2013-01-21 (×8): qty 2

## 2013-01-21 MED ORDER — HYDROCOD POLST-CHLORPHEN POLST 10-8 MG/5ML PO LQCR
5.0000 mL | Freq: Every day | ORAL | Status: DC | PRN
  Administered 2013-01-21 – 2013-01-22 (×2): 5 mL via ORAL
  Filled 2013-01-21 (×2): qty 5

## 2013-01-21 MED ORDER — HYDROCOD POLST-CHLORPHEN POLST 10-8 MG/5ML PO LQCR
5.0000 mL | Freq: Every day | ORAL | Status: DC
  Administered 2013-01-21 – 2013-01-22 (×2): 5 mL via ORAL
  Filled 2013-01-21 (×3): qty 5
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
## 2013-01-22 DIAGNOSIS — R1013 Epigastric pain: Secondary | ICD-10-CM

## 2013-01-22 LAB — CBC
HCT: 38.7 % — ABNORMAL LOW (ref 39.0–52.0)
HEMOGLOBIN: 13.2 g/dL (ref 13.0–17.0)
MCH: 30.1 pg (ref 26.0–34.0)
MCHC: 34.1 g/dL (ref 30.0–36.0)
MCV: 88.4 fL (ref 78.0–100.0)
Platelets: 239 10*3/uL (ref 150–400)
RBC: 4.38 MIL/uL (ref 4.22–5.81)
RDW: 13.3 % (ref 11.5–15.5)
WBC: 8.4 10*3/uL (ref 4.0–10.5)

## 2013-01-22 LAB — BLOOD GAS, ARTERIAL
ACID-BASE DEFICIT: 4.4 mmol/L — AB (ref 0.0–2.0)
Bicarbonate: 18.8 mEq/L — ABNORMAL LOW (ref 20.0–24.0)
DRAWN BY: 10552
FIO2: 0.21 %
O2 Saturation: 98.6 %
PATIENT TEMPERATURE: 98.6
PCO2 ART: 26.9 mmHg — AB (ref 35.0–45.0)
TCO2: 19.6 mmol/L (ref 0–100)
pH, Arterial: 7.458 — ABNORMAL HIGH (ref 7.350–7.450)
pO2, Arterial: 105 mmHg — ABNORMAL HIGH (ref 80.0–100.0)

## 2013-01-22 LAB — BASIC METABOLIC PANEL
BUN: 8 mg/dL (ref 6–23)
CO2: 16 mEq/L — ABNORMAL LOW (ref 19–32)
Calcium: 8 mg/dL — ABNORMAL LOW (ref 8.4–10.5)
Chloride: 108 mEq/L (ref 96–112)
Creatinine, Ser: 0.78 mg/dL (ref 0.50–1.35)
Glucose, Bld: 110 mg/dL — ABNORMAL HIGH (ref 70–99)
POTASSIUM: 3.5 meq/L — AB (ref 3.7–5.3)
Sodium: 139 mEq/L (ref 137–147)

## 2013-01-22 LAB — CK: CK TOTAL: 280 U/L — AB (ref 7–232)

## 2013-01-22 MED ORDER — HYDROXYZINE HCL 50 MG/ML IM SOLN
25.0000 mg | INTRAMUSCULAR | Status: DC | PRN
  Filled 2013-01-22: qty 0.5

## 2013-01-22 MED ORDER — DIPHENHYDRAMINE HCL 50 MG/ML IJ SOLN
25.0000 mg | Freq: Four times a day (QID) | INTRAMUSCULAR | Status: DC | PRN
  Administered 2013-01-22 – 2013-01-23 (×4): 25 mg via INTRAVENOUS
  Filled 2013-01-22 (×4): qty 1

## 2013-01-22 MED ORDER — LANSOPRAZOLE 30 MG PO CPDR
30.0000 mg | DELAYED_RELEASE_CAPSULE | Freq: Every day | ORAL | Status: DC
  Administered 2013-01-23: 30 mg via ORAL
  Filled 2013-01-22: qty 1
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
## 2013-01-23 ENCOUNTER — Inpatient Hospital Stay (HOSPITAL_COMMUNITY): Payer: Medicaid Other

## 2013-01-23 LAB — URINE CULTURE: Colony Count: 4000

## 2013-01-23 LAB — BASIC METABOLIC PANEL
BUN: 5 mg/dL — AB (ref 6–23)
CALCIUM: 7.9 mg/dL — AB (ref 8.4–10.5)
CO2: 19 mEq/L (ref 19–32)
CREATININE: 0.88 mg/dL (ref 0.50–1.35)
Chloride: 109 mEq/L (ref 96–112)
GFR calc Af Amer: >90 mL/min (ref >90)
GLUCOSE: 120 mg/dL — AB (ref 70–99)
Potassium: 3.4 mEq/L — ABNORMAL LOW (ref 3.7–5.3)
Sodium: 142 mEq/L (ref 137–147)

## 2013-01-23 MED ORDER — ALBUTEROL SULFATE HFA 108 (90 BASE) MCG/ACT IN AERS
2.0000 | INHALATION_SPRAY | Freq: Once | RESPIRATORY_TRACT | Status: AC
  Administered 2013-01-23: 2 via RESPIRATORY_TRACT
  Filled 2013-01-23: qty 6.7

## 2013-01-23 MED ORDER — ALBUTEROL SULFATE HFA 108 (90 BASE) MCG/ACT IN AERS: 2.0000 | INHALATION_SPRAY | Freq: Four times a day (QID) | RESPIRATORY_TRACT | Status: DC | PRN

## 2013-01-23 MED ORDER — HYDROMORPHONE HCL 2 MG PO TABS: 1.0000 mg | ORAL_TABLET | Freq: Four times a day (QID) | ORAL | Status: DC | PRN

## 2013-01-23 MED ORDER — FUROSEMIDE 40 MG PO TABS
40.0000 mg | ORAL_TABLET | Freq: Once | ORAL | Status: AC
  Administered 2013-01-23: 40 mg via ORAL
  Filled 2013-01-23: qty 1

## 2013-01-23 MED ORDER — HYDROCOD POLST-CHLORPHEN POLST 10-8 MG/5ML PO LQCR: 5.0000 mL | Freq: Two times a day (BID) | ORAL | Status: DC | PRN

## 2013-01-23 MED ORDER — METHOCARBAMOL 750 MG PO TABS: 750.0000 mg | ORAL_TABLET | Freq: Four times a day (QID) | ORAL | Status: DC | PRN

## 2013-01-23 MED ORDER — HYDROCORTISONE 1 % EX CREA: 1.0000 "application " | TOPICAL_CREAM | Freq: Two times a day (BID) | CUTANEOUS | Status: DC

## 2013-01-23 MED ORDER — AMOXICILLIN-POT CLAVULANATE 875-125 MG PO TABS
1.0000 | ORAL_TABLET | Freq: Two times a day (BID) | ORAL | Status: DC
Start: 2013-01-23 — End: 2013-01-23
  Administered 2013-01-23: 1 via ORAL
  Filled 2013-01-23: qty 1

## 2013-01-23 MED ORDER — DM-GUAIFENESIN ER 30-600 MG PO TB12: 1.0000 | ORAL_TABLET | Freq: Two times a day (BID) | ORAL | Status: DC

## 2013-01-23 MED ORDER — AMOXICILLIN-POT CLAVULANATE 875-125 MG PO TABS: 1.0000 | ORAL_TABLET | Freq: Two times a day (BID) | ORAL | Status: DC

## 2013-01-23 MED ORDER — HYDROCODONE-ACETAMINOPHEN 10-325 MG PO TABS: 1.0000 | ORAL_TABLET | Freq: Four times a day (QID) | ORAL | Status: DC | PRN

## 2013-01-23 MED ORDER — POTASSIUM CHLORIDE CRYS ER 20 MEQ PO TBCR
40.0000 meq | EXTENDED_RELEASE_TABLET | Freq: Once | ORAL | Status: AC
  Administered 2013-01-23: 40 meq via ORAL
  Filled 2013-01-23: qty 2

## 2013-01-23 NOTE — Progress Notes (Signed)
Agree with PTA.    Bea Duren, PT [phone]  
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
## 2013-01-25 ENCOUNTER — Telehealth: Payer: Self-pay | Admitting: *Deleted

## 2013-01-25 NOTE — Telephone Encounter (Signed)
Pt called wanting to see if he can get any samples of [place], [phone]

## 2013-01-26 NOTE — Telephone Encounter (Signed)
Pt aware that we are out of dexilant samples at this time.

## 2013-01-31 ENCOUNTER — Telehealth: Payer: Self-pay | Admitting: *Deleted

## 2013-01-31 NOTE — Telephone Encounter (Signed)
Pt's mother called wanting to see about getting pt dexilant samples, please advise [phone]

## 2013-02-02 NOTE — Telephone Encounter (Signed)
Tried to call pt- Benjamin [name]. We received approval from the dexilant patient assistance company. They will be mailing the dexilant to him. Gave him #2 boxes of dexilant to last him until he receives it from the company.

## 2013-02-02 NOTE — Telephone Encounter (Signed)
noted 

## 2013-02-16 ENCOUNTER — Other Ambulatory Visit (HOSPITAL_COMMUNITY): Payer: Self-pay | Admitting: Family Medicine

## 2013-02-16 ENCOUNTER — Ambulatory Visit (HOSPITAL_COMMUNITY)
Admission: RE | Admit: 2013-02-16 | Discharge: 2013-02-16 | Disposition: A | Discharge status: Home / Self Care | Payer: Self-pay | Source: Ambulatory Visit | Attending: Family Medicine | Admitting: Family Medicine

## 2013-02-16 DIAGNOSIS — R05 Cough: Secondary | ICD-10-CM | POA: Insufficient documentation

## 2013-02-16 DIAGNOSIS — J984 Other disorders of lung: Secondary | ICD-10-CM | POA: Insufficient documentation

## 2013-02-16 DIAGNOSIS — R079 Chest pain, unspecified: Secondary | ICD-10-CM | POA: Insufficient documentation

## 2013-02-16 DIAGNOSIS — R059 Cough, unspecified: Secondary | ICD-10-CM | POA: Insufficient documentation

## 2013-02-16 DIAGNOSIS — Z8701 Personal history of pneumonia (recurrent): Secondary | ICD-10-CM | POA: Insufficient documentation

## 2013-02-16 DIAGNOSIS — J189 Pneumonia, unspecified organism: Secondary | ICD-10-CM

## 2013-02-16 DIAGNOSIS — J9819 Other pulmonary collapse: Secondary | ICD-10-CM | POA: Insufficient documentation

## 2013-05-23 ENCOUNTER — Ambulatory Visit (INDEPENDENT_AMBULATORY_CARE_PROVIDER_SITE_OTHER): Payer: Self-pay | Admitting: Gastroenterology

## 2013-05-23 ENCOUNTER — Encounter: Payer: Self-pay | Admitting: Gastroenterology

## 2013-05-23 ENCOUNTER — Encounter (HOSPITAL_COMMUNITY): Payer: Self-pay | Admitting: Pharmacy Technician

## 2013-05-23 VITALS — BP 128/75 | HR 82 | Temp 98.3°F | Ht 67.0 in | Wt 174.6 lb

## 2013-05-23 DIAGNOSIS — K269 Duodenal ulcer, unspecified as acute or chronic, without hemorrhage or perforation: Secondary | ICD-10-CM

## 2013-05-23 DIAGNOSIS — K5900 Constipation, unspecified: Secondary | ICD-10-CM

## 2013-05-23 DIAGNOSIS — K219 Gastro-esophageal reflux disease without esophagitis: Secondary | ICD-10-CM

## 2013-05-23 NOTE — Patient Instructions (Signed)
Continue to take Dexilant daily for reflux and Linzess each morning 30 minutes before breakfast for constipation.  We have set you up for an upper endoscopy with Dr. Gala Romney to make sure the ulcers have healed.
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
## 2013-05-26 NOTE — Assessment & Plan Note (Signed)
Continue Linzess 290 mcg daily. Next surveillance colonoscopy 2019.

## 2013-05-26 NOTE — Assessment & Plan Note (Signed)
Continue Dexilant daily and dietary/behavior modification.

## 2013-05-26 NOTE — Assessment & Plan Note (Signed)
43 year old with multiple duodenal bulbar ulcers in the setting of NSAID use last year, negative H.pylori, no surveillance completed. Likely ulcers secondary to NSAIDs and doubt a concerning pathology. However, continues to report intermittent epigastric discomfort, and we discussed repeat EGD to document evidence of healing. He is agreeable to this.  Continue Dexilant daily. Continue to avoid all NSAIDs Proceed with upper endoscopy in the near future with Dr. Gala Romney. The risks, benefits, and alternatives have been discussed in detail with patient. They have stated understanding and desire to proceed.  PHENERGAN 25 mg IV ON CALL DUE TO POLYPHARMACY Gallbladder remains in situ; highly doubt biliary component to epigastric discomfort. Likely GERD/gastritis related.

## 2013-05-29 NOTE — Progress Notes (Signed)
cc'd to pcp 

## 2013-06-02 ENCOUNTER — Ambulatory Visit (HOSPITAL_COMMUNITY)
Admission: RE | Admit: 2013-06-02 | Discharge: 2013-06-02 | Disposition: A | Discharge status: Home / Self Care | Payer: Medicaid Other | Source: Ambulatory Visit | Attending: Internal Medicine | Admitting: Internal Medicine

## 2013-06-02 ENCOUNTER — Encounter (HOSPITAL_COMMUNITY)
Admission: RE | Disposition: A | Discharge status: Home / Self Care | Payer: Self-pay | Source: Ambulatory Visit | Attending: Internal Medicine

## 2013-06-02 ENCOUNTER — Encounter (HOSPITAL_COMMUNITY): Payer: Self-pay | Admitting: *Deleted

## 2013-06-02 DIAGNOSIS — F17200 Nicotine dependence, unspecified, uncomplicated: Secondary | ICD-10-CM | POA: Insufficient documentation

## 2013-06-02 DIAGNOSIS — F329 Major depressive disorder, single episode, unspecified: Secondary | ICD-10-CM | POA: Diagnosis not present

## 2013-06-02 DIAGNOSIS — Z79899 Other long term (current) drug therapy: Secondary | ICD-10-CM | POA: Insufficient documentation

## 2013-06-02 DIAGNOSIS — K2289 Other specified disease of esophagus: Secondary | ICD-10-CM | POA: Insufficient documentation

## 2013-06-02 DIAGNOSIS — K5732 Diverticulitis of large intestine without perforation or abscess without bleeding: Secondary | ICD-10-CM | POA: Diagnosis not present

## 2013-06-02 DIAGNOSIS — K228 Other specified diseases of esophagus: Secondary | ICD-10-CM | POA: Insufficient documentation

## 2013-06-02 DIAGNOSIS — F3289 Other specified depressive episodes: Secondary | ICD-10-CM | POA: Diagnosis not present

## 2013-06-02 DIAGNOSIS — K589 Irritable bowel syndrome without diarrhea: Secondary | ICD-10-CM | POA: Insufficient documentation

## 2013-06-02 DIAGNOSIS — K5900 Constipation, unspecified: Secondary | ICD-10-CM

## 2013-06-02 DIAGNOSIS — Z791 Long term (current) use of non-steroidal anti-inflammatories (NSAID): Secondary | ICD-10-CM | POA: Diagnosis not present

## 2013-06-02 DIAGNOSIS — K2100 Gastro-esophageal reflux disease with esophagitis, without bleeding: Secondary | ICD-10-CM | POA: Diagnosis not present

## 2013-06-02 DIAGNOSIS — K219 Gastro-esophageal reflux disease without esophagitis: Secondary | ICD-10-CM

## 2013-06-02 DIAGNOSIS — F411 Generalized anxiety disorder: Secondary | ICD-10-CM | POA: Insufficient documentation

## 2013-06-02 DIAGNOSIS — K3189 Other diseases of stomach and duodenum: Secondary | ICD-10-CM | POA: Diagnosis present

## 2013-06-02 DIAGNOSIS — K269 Duodenal ulcer, unspecified as acute or chronic, without hemorrhage or perforation: Secondary | ICD-10-CM

## 2013-06-02 HISTORY — PX: ESOPHAGOGASTRODUODENOSCOPY: SHX5428

## 2013-06-02 SURGERY — EGD (ESOPHAGOGASTRODUODENOSCOPY)
Anesthesia: Moderate Sedation

## 2013-06-02 MED ORDER — PROMETHAZINE HCL 25 MG/ML IJ SOLN
25.0000 mg | Freq: Once | INTRAMUSCULAR | Status: AC
  Administered 2013-06-02: 25 mg via INTRAVENOUS

## 2013-06-02 MED ORDER — ONDANSETRON HCL 4 MG/2ML IJ SOLN
INTRAMUSCULAR | Status: AC
  Filled 2013-06-02: qty 2

## 2013-06-02 MED ORDER — SODIUM CHLORIDE 0.9 % IV SOLN
INTRAVENOUS | Status: DC
  Administered 2013-06-02: 12:00:00 via INTRAVENOUS

## 2013-06-02 MED ORDER — SODIUM CHLORIDE 0.9 % IJ SOLN
INTRAMUSCULAR | Status: AC
  Filled 2013-06-02: qty 10

## 2013-06-02 MED ORDER — LIDOCAINE VISCOUS 2 % MT SOLN
OROMUCOSAL | Status: DC | PRN
  Administered 2013-06-02: 4 mL via OROMUCOSAL

## 2013-06-02 MED ORDER — MIDAZOLAM HCL 5 MG/5ML IJ SOLN
INTRAMUSCULAR | Status: DC | PRN
  Administered 2013-06-02 (×2): 2 mg via INTRAVENOUS

## 2013-06-02 MED ORDER — MEPERIDINE HCL 100 MG/ML IJ SOLN
INTRAMUSCULAR | Status: DC | PRN
  Administered 2013-06-02: 25 mg via INTRAVENOUS
  Administered 2013-06-02: 50 mg via INTRAVENOUS

## 2013-06-02 MED ORDER — PROMETHAZINE HCL 25 MG/ML IJ SOLN
INTRAMUSCULAR | Status: AC
  Filled 2013-06-02: qty 1

## 2013-06-02 MED ORDER — ONDANSETRON HCL 4 MG/2ML IJ SOLN
INTRAMUSCULAR | Status: DC | PRN
  Administered 2013-06-02: 4 mg via INTRAVENOUS

## 2013-06-02 MED ORDER — MEPERIDINE HCL 100 MG/ML IJ SOLN
INTRAMUSCULAR | Status: AC
  Filled 2013-06-02: qty 2

## 2013-06-02 MED ORDER — STERILE WATER FOR IRRIGATION IR SOLN
Status: DC | PRN
  Administered 2013-06-02: 13:00:00

## 2013-06-02 MED ORDER — LIDOCAINE VISCOUS 2 % MT SOLN
OROMUCOSAL | Status: AC
  Filled 2013-06-02: qty 15

## 2013-06-02 MED ORDER — MIDAZOLAM HCL 5 MG/5ML IJ SOLN
INTRAMUSCULAR | Status: AC
  Filled 2013-06-02: qty 10

## 2013-06-02 NOTE — Op Note (Signed)
[facility] [address] [place], [address]   ENDOSCOPY PROCEDURE REPORT  PATIENT: Benjamin, [name]  MR#: [id number] BIRTHDATE: April 01, 1970 , 42  yrs. old GENDER: Male ENDOSCOPIST: R.  Garfield Cornea, MD FACP FACG REFERRED BY:  Antony Contras, M.D. PROCEDURE DATE:  06/02/2013 PROCEDURE:    EGD with esophageal biopsy  INDICATIONS:    Persisting dyspepsia; history of extensive peptic ulcer disease and severe reflux esophagitis diagnosed last year  INFORMED CONSENT:   The risks, benefits, limitations, alternatives and imponderables have been discussed.  The potential for biopsy, esophogeal dilation, etc. have also been reviewed.  Questions have been answered.  All parties agreeable.  Please see the history and physical in the medical record for more information.  MEDICATIONS: Versed 4 mg IV and Demerol 75 mg IV in divided doses. Xylocaine gel orally Zofran 4 mg IV and Phenergan 25 mg IV  DESCRIPTION OF PROCEDURE:   The [id number] ([id number])  endoscope was introduced through the mouth and advanced to the second portion of the duodenum without difficulty or limitations.  The mucosal surfaces were surveyed very carefully during advancement of the scope and upon withdrawal.  Retroflexion view of the proximal stomach and esophagogastric junction was performed.      FINDINGS:  couple of 49mm linear distal esophageal erosions. Salmon-colored epithelium present at the GE junction. Accentuated undulating Z line versus short segment Barrett's esophagus present. No tumor. Stomach empty. Normal-appearing gastric mucosa patent pylorus. Deformity of the bulb and proximal second portion; previously noted ulcer disease completely healed.  THERAPEUTIC / DIAGNOSTIC MANEUVERS PERFORMED:  Biopsies of the abnormal distal esophagus taken for histologic study   COMPLICATIONS:  None  IMPRESSION:   Mild erosive reflux esophagitis.  RECOMMENDATIONS:   Continue Dexilant 60 mg daily  indefinitely. Add Carafate suspension 1 g 4 times a day. Followup on pathology. Office visit with [place] in 3 months.  Avoid nonsteroidal agents.    _______________________________ R. Garfield Cornea, MD FACP [facility] eSigned:  R. Garfield Cornea, MD FACP [facility] 06/02/2013 1:23 PM     CC:

## 2013-06-02 NOTE — Discharge Instructions (Signed)

## 2013-06-02 NOTE — Interval H&P Note (Signed)
History and Physical Interval Note:  06/02/2013 12:52 PM  Benjamin [name]  has presented today for surgery, with the diagnosis of GERD, CONSTIPATION, DUEDENAL ULCERS  The various methods of treatment have been discussed with the patient and family. After consideration of risks, benefits and other options for treatment, the patient has consented to  Procedure(s) with comments: ESOPHAGOGASTRODUODENOSCOPY (EGD) (N/A) - 12:15 as a surgical intervention .  The patient's history has been reviewed, patient examined, no change in status, stable for surgery.  I have reviewed the patient's chart and labs.  Questions were answered to the patient's satisfaction.   No change. EGD per plan.The risks, benefits, limitations, alternatives and imponderables have been reviewed with the patient. Potential for esophageal dilation, biopsy, etc. have also been reviewed.  Questions have been answered. All parties agreeable.  Cristopher Estimable Karie Skowron
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
## 2013-06-05 ENCOUNTER — Encounter (HOSPITAL_COMMUNITY): Payer: Self-pay | Admitting: Internal Medicine

## 2013-06-06 ENCOUNTER — Telehealth: Payer: Self-pay

## 2013-06-06 NOTE — Telephone Encounter (Signed)
Pt can not afford the Carafate and would like to know what elsa he could do. Please advise

## 2013-06-11 ENCOUNTER — Encounter: Payer: Self-pay | Admitting: Internal Medicine

## 2013-06-11 NOTE — Progress Notes (Signed)
Patient ID: Benjamin [name], male   DOB: [birth year], 43 y.o.   MRN: [id number] Patient can try taking (2) pepsid complete at bedtime along with Dexilant - needs to see extender in a couple weeks - if no better,  Will need further evaluation

## 2013-06-12 NOTE — Telephone Encounter (Signed)
If I did not  previously respond to this query, patient can take a couple of Pepcid Complete at bedtime in addition to his current regimen in lieu of Carafate.

## 2013-06-14 NOTE — Telephone Encounter (Signed)
Tried to call with no answer  

## 2013-06-15 NOTE — Telephone Encounter (Signed)
See separate phone note, pt also needs ov in a couple of weeks if no better.

## 2013-06-15 NOTE — Progress Notes (Signed)
See separate phone note. Tried to call pt- LMOM

## 2013-06-15 NOTE — Telephone Encounter (Signed)
Tried to call pt- LMOM 

## 2013-06-16 NOTE — Progress Notes (Signed)
Ginger informed pt.

## 2013-06-16 NOTE — Telephone Encounter (Signed)
Pt is aware and he is feeling a little bit better

## 2013-10-04 ENCOUNTER — Telehealth: Payer: Self-pay

## 2013-10-04 NOTE — Telephone Encounter (Signed)
Pt is getting patient assistance for Dexilant. I received a fax from [place] saying they need a new rx. It will need to be printed and faxed to the company.  Fax [phone]

## 2013-10-05 MED ORDER — DEXLANSOPRAZOLE 60 MG PO CPDR
60.0000 mg | DELAYED_RELEASE_CAPSULE | Freq: Every day | ORAL | Status: DC
Start: 2013-10-05 — End: 2014-03-14

## 2013-10-05 NOTE — Telephone Encounter (Signed)
Faxed

## 2013-10-05 NOTE — Telephone Encounter (Signed)
Completed.

## 2013-11-21 ENCOUNTER — Telehealth: Payer: Self-pay | Admitting: Internal Medicine

## 2013-11-21 NOTE — Telephone Encounter (Signed)
Patient called this afternoon asking if we would do his paper work for him to get his dexilant through the mail and he still has a month supply left. Then he asked if he could start taking Linzess again and could he get samples. Please advise and call [phone]

## 2013-11-21 NOTE — Telephone Encounter (Signed)
pts refill paperwork for dexilant patient assistance was faxed to them in September. A copy of this is in the media section of his chart.   Pt was never told to stop taking the linzess 267mcg. Per his last ov- the recommendations were for pt to continue Linzess. #3 boxes are at the front desk.   Please let him know. Thanks.

## 2013-11-23 NOTE — Telephone Encounter (Signed)
LMOM for patient that nurse faxed the assistance papers for his dexilant in September and that we also have samples for him up front.

## 2013-12-23 IMAGING — CR DG SHOULDER 2+V*L*
3 series · 3 of 3 positions shown · non-contrast
Comparison: None.

CLINICAL DATA: Left shoulder pain

LEFT SHOULDER - 2+ VIEW

[view not recorded (1 of 3)]
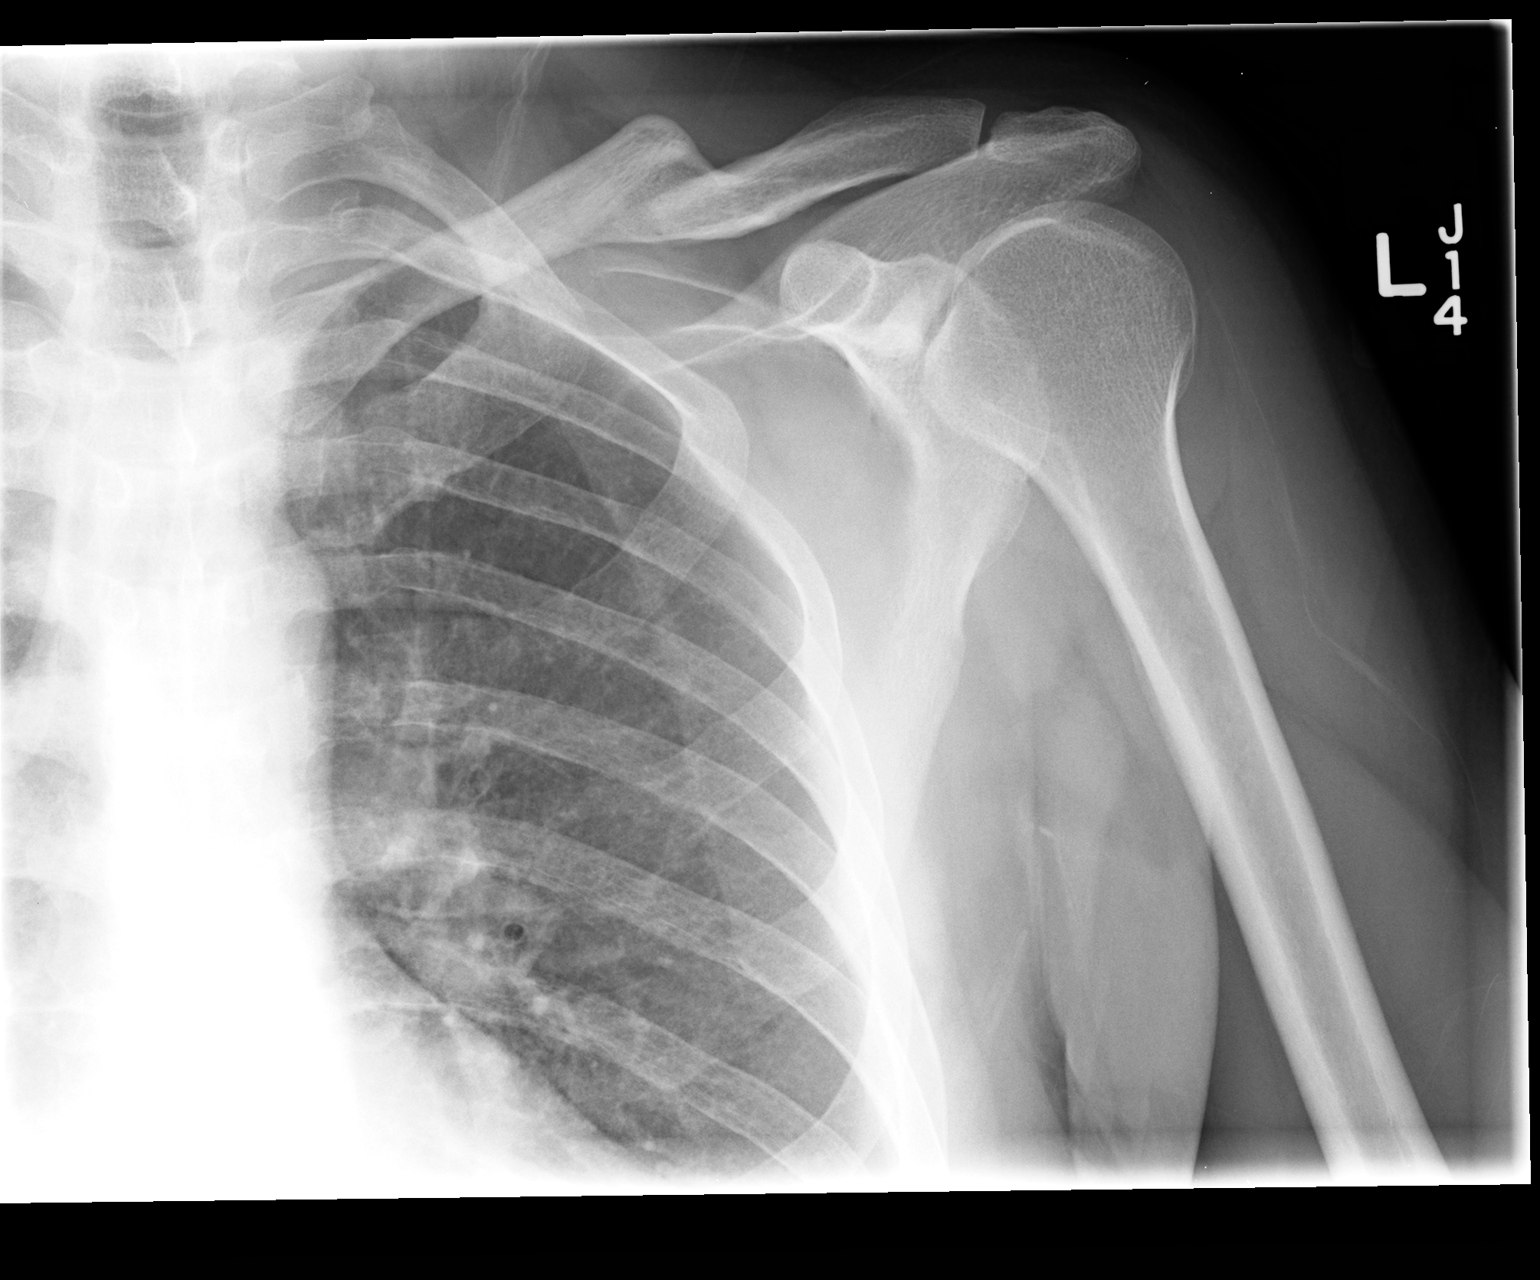

[view not recorded (2 of 3)]
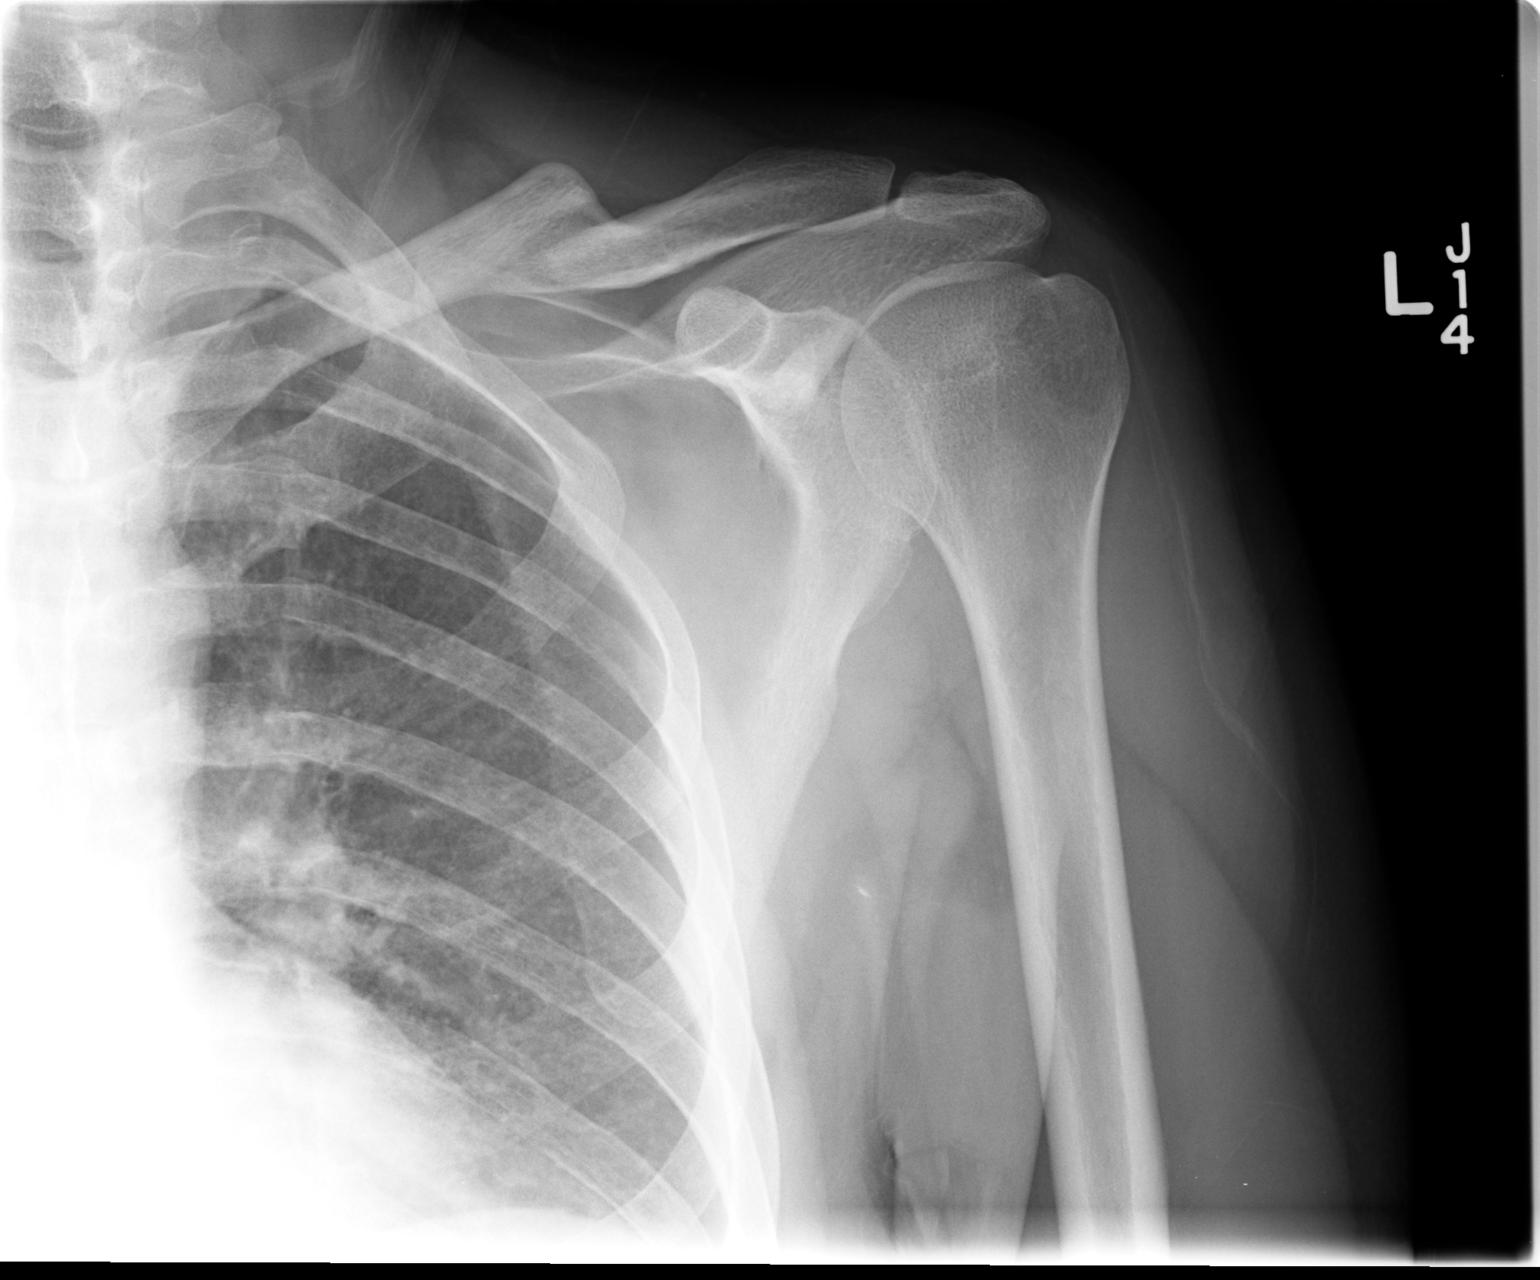

[view not recorded (3 of 3)]
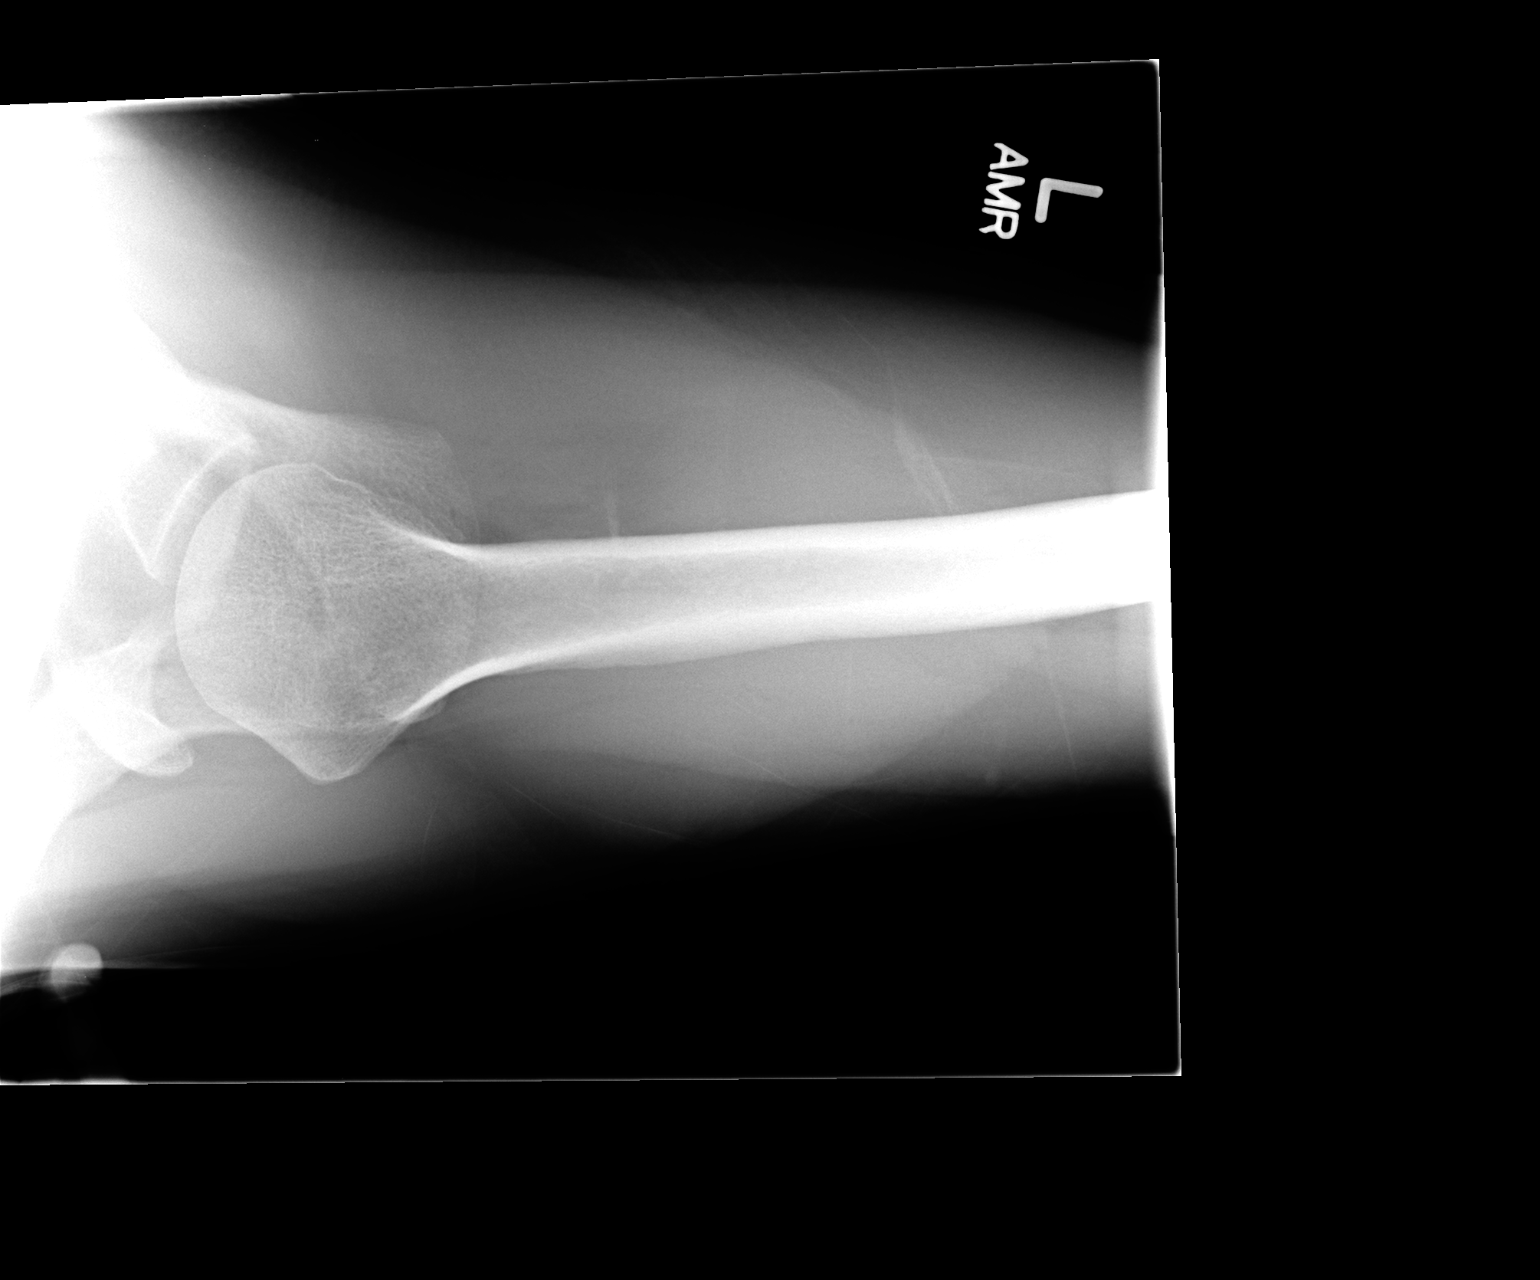

[3 of 3 positions shown; findings below may reference images not displayed]

FINDINGS: Three views of the left shoulder submitted.  No acute
fracture or subluxation.  Old fracture of the left clavicle.
IMPRESSION: No acute fracture or subluxation.  Old fracture of the left
clavicle.

## 2014-01-08 ENCOUNTER — Telehealth: Payer: Self-pay | Admitting: Internal Medicine

## 2014-01-08 NOTE — Telephone Encounter (Signed)
Patient came in office for linzess samples

## 2014-01-08 NOTE — Telephone Encounter (Signed)
Gave #3 boxes of linzess.

## 2014-02-21 ENCOUNTER — Ambulatory Visit (INDEPENDENT_AMBULATORY_CARE_PROVIDER_SITE_OTHER): Payer: Medicaid Other | Admitting: Gastroenterology

## 2014-02-21 ENCOUNTER — Encounter: Payer: Self-pay | Admitting: Gastroenterology

## 2014-02-21 VITALS — BP 140/88 | HR 87 | Temp 96.9°F | Ht 67.0 in | Wt 169.8 lb

## 2014-02-21 DIAGNOSIS — R1013 Epigastric pain: Secondary | ICD-10-CM

## 2014-02-21 NOTE — Assessment & Plan Note (Signed)
44 year old male with recurrent epigastric pain, EGD on file with erosive esophagitis. GERD controlled with Dexilant daily. Some improvement with Carafate. Gallbladder remains in situ. Question chronic abdominal wall pain but will proceed with US abdomen to assess for any gallstones. May need HIDA. Symptoms do not appear typical of biliary etiology; further recommendations to follow.
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
## 2014-02-21 NOTE — Patient Instructions (Addendum)
Continue Dexilant and Carafate.  I have ordered an ultrasound of your abdomen. You may need further work-up after this.   Further recommendations to follow

## 2014-02-26 ENCOUNTER — Ambulatory Visit (HOSPITAL_COMMUNITY)
Admission: RE | Admit: 2014-02-26 | Discharge: 2014-02-26 | Disposition: A | Discharge status: Home / Self Care | Payer: Medicaid Other | Source: Ambulatory Visit | Attending: Gastroenterology | Admitting: Gastroenterology

## 2014-02-26 DIAGNOSIS — R1013 Epigastric pain: Secondary | ICD-10-CM | POA: Insufficient documentation

## 2014-03-06 ENCOUNTER — Telehealth: Payer: Self-pay | Admitting: Internal Medicine

## 2014-03-06 NOTE — Telephone Encounter (Signed)
Routing to AS for results.  

## 2014-03-06 NOTE — Telephone Encounter (Signed)
PATIENT CALLED INQUIRING ABOUT ULTRASOUND RESULTS AND ALSO HAD A QUESTION ABOUT A MEDICATION REFILL

## 2014-03-07 ENCOUNTER — Other Ambulatory Visit: Payer: Self-pay

## 2014-03-07 ENCOUNTER — Other Ambulatory Visit: Payer: Self-pay | Admitting: Gastroenterology

## 2014-03-07 DIAGNOSIS — K760 Fatty (change of) liver, not elsewhere classified: Secondary | ICD-10-CM

## 2014-03-07 NOTE — Telephone Encounter (Signed)
Spoke with the pt. See result note. 

## 2014-03-07 NOTE — Telephone Encounter (Signed)
Please see result notes. Needs HIDA and HFP.

## 2014-03-07 NOTE — Progress Notes (Signed)
Quick Note:  Fatty liver on ultrasound. Need HFP and HIDA ______

## 2014-03-07 NOTE — Telephone Encounter (Signed)
Tried to call pt- LM at home number to return call.

## 2014-03-10 NOTE — Progress Notes (Signed)
CC'ED TO REFERRING AND PCP 

## 2014-03-13 ENCOUNTER — Encounter (HOSPITAL_COMMUNITY)
Admission: RE | Admit: 2014-03-13 | Discharge: 2014-03-13 | Disposition: A | Discharge status: Home / Self Care | Payer: Medicaid Other | Source: Ambulatory Visit | Attending: Gastroenterology | Admitting: Gastroenterology

## 2014-03-13 DIAGNOSIS — K760 Fatty (change of) liver, not elsewhere classified: Secondary | ICD-10-CM | POA: Insufficient documentation

## 2014-03-13 NOTE — Telephone Encounter (Signed)
Pt called again, he needs an rx printed and faxed to the patient assistance company for [place].

## 2014-03-14 ENCOUNTER — Telehealth: Payer: Self-pay

## 2014-03-14 MED ORDER — DEXLANSOPRAZOLE 60 MG PO CPDR: 60.0000 mg | DELAYED_RELEASE_CAPSULE | Freq: Every day | ORAL | Status: DC

## 2014-03-14 NOTE — Addendum Note (Signed)
Addended by: Orvil Feil on: 03/14/2014 11:05 AM   Modules accepted: Orders

## 2014-03-14 NOTE — Telephone Encounter (Signed)
Completed.

## 2014-03-14 NOTE — Telephone Encounter (Signed)
Pt states that he is going for a HIDA on 03/16/2014 and that he can't take any pain medication and he has to lay flat on his back for an hour. States he can't do that. Wants to know if there is anything else we can have him do instead. States he is probably going to have to cancel.  Please advise.

## 2014-03-15 NOTE — Telephone Encounter (Signed)
Called dexilant pt assistance. pts assistance ran out 01/18/14. Pt will need to apply again. I have tried to call pt- LMOM with this information and have mailed him application forms.

## 2014-03-16 ENCOUNTER — Encounter (HOSPITAL_COMMUNITY): Payer: MEDICAID

## 2014-03-19 NOTE — Telephone Encounter (Signed)
You may refer him to Dr. Arnoldo Morale just to discuss. Have him return for routine follow-up with [place].

## 2014-03-19 NOTE — Telephone Encounter (Signed)
Benjamin [name] doesn't take pt insurance can we refer to [facility]?

## 2014-03-23 ENCOUNTER — Other Ambulatory Visit: Payer: Self-pay

## 2014-03-23 DIAGNOSIS — R109 Unspecified abdominal pain: Secondary | ICD-10-CM

## 2014-03-23 DIAGNOSIS — M549 Dorsalgia, unspecified: Secondary | ICD-10-CM

## 2014-03-23 NOTE — Telephone Encounter (Signed)
Error. Referral was faxed to [facility]

## 2014-03-26 ENCOUNTER — Telehealth: Payer: Self-pay

## 2014-03-26 NOTE — Telephone Encounter (Signed)
Pt will need to have HIDA scan done before seen by Dr. Cam Hai office. Per Jenkins's office. Please advise

## 2014-03-26 NOTE — Telephone Encounter (Signed)
We can set it up. He declined having it done before.

## 2014-03-28 NOTE — Telephone Encounter (Signed)
Called and was unable to get in touch with pt. Unable to leave message

## 2014-04-03 NOTE — Telephone Encounter (Signed)
Mailed out letter.

## 2014-04-03 NOTE — Telephone Encounter (Signed)
Tried to call with no answer  

## 2014-04-04 ENCOUNTER — Telehealth: Payer: Self-pay | Admitting: Internal Medicine

## 2014-04-04 NOTE — Telephone Encounter (Signed)
Pt called to say that he can not lay flat on his back and his chest is hurting. I asked if he needed to make an OV or does he need to speak with a nurse. He doesn't know. Please advise and call him at [phone]

## 2014-04-04 NOTE — Telephone Encounter (Signed)
Benjamin [name] spoke with the pt. They have been trying to get in touch with him.

## 2014-04-04 NOTE — Telephone Encounter (Signed)
Pt called today and he can not have the HIDA scan because he can not lay on his back.

## 2014-04-19 ENCOUNTER — Telehealth: Payer: Self-pay | Admitting: Internal Medicine

## 2014-04-19 ENCOUNTER — Other Ambulatory Visit: Payer: Self-pay | Admitting: Nurse Practitioner

## 2014-04-19 MED ORDER — DEXLANSOPRAZOLE 60 MG PO CPDR: 60.0000 mg | DELAYED_RELEASE_CAPSULE | Freq: Every day | ORAL | Status: DC

## 2014-04-19 NOTE — Telephone Encounter (Signed)
rx approved through [employer].

## 2014-04-19 NOTE — Telephone Encounter (Signed)
PATIENT STATES THAT HE IS NO LONGER GETTING [place], CAN HE HAVE ANOTHER [place],  [place] [phone]

## 2014-04-19 NOTE — Telephone Encounter (Signed)
Spoke with the pt, we had sent in patient assistance forms for him. He now has [employer] medicaid and they will not approve him. Advised him that since he has tried and failed omeprazole and pantoprazole that we can get dexilant covered for him. Per EG- it is ok to send in rx per patient assistance forms. dexilant 60mg  qd #30 with 11 rf. rx sent to [facility].

## 2014-06-01 ENCOUNTER — Other Ambulatory Visit: Payer: Self-pay | Admitting: Nurse Practitioner

## 2014-06-01 ENCOUNTER — Ambulatory Visit
Admission: RE | Admit: 2014-06-01 | Discharge: 2014-06-01 | Disposition: A | Discharge status: Home / Self Care | Payer: Medicaid Other | Source: Ambulatory Visit | Attending: Nurse Practitioner | Admitting: Nurse Practitioner

## 2014-06-01 DIAGNOSIS — M545 Low back pain: Secondary | ICD-10-CM

## 2014-07-16 ENCOUNTER — Other Ambulatory Visit: Payer: Self-pay

## 2014-07-17 MED ORDER — SUCRALFATE 1 GM/10ML PO SUSP: 1.0000 g | Freq: Four times a day (QID) | ORAL | Status: DC | PRN

## 2014-11-23 ENCOUNTER — Other Ambulatory Visit: Payer: Self-pay | Admitting: Pain Medicine

## 2014-11-23 DIAGNOSIS — M545 Low back pain: Secondary | ICD-10-CM

## 2014-12-07 ENCOUNTER — Ambulatory Visit
Admission: RE | Admit: 2014-12-07 | Discharge: 2014-12-07 | Disposition: A | Discharge status: Home / Self Care | Payer: Medicaid Other | Source: Ambulatory Visit | Attending: Pain Medicine | Admitting: Pain Medicine

## 2014-12-07 DIAGNOSIS — M545 Low back pain: Secondary | ICD-10-CM

## 2014-12-10 ENCOUNTER — Other Ambulatory Visit (HOSPITAL_COMMUNITY)
Admission: RE | Admit: 2014-12-10 | Discharge: 2014-12-10 | Disposition: A | Discharge status: Home / Self Care | Payer: Medicaid Other | Source: Ambulatory Visit | Attending: Gastroenterology | Admitting: Gastroenterology

## 2014-12-10 ENCOUNTER — Other Ambulatory Visit: Payer: Self-pay

## 2014-12-10 ENCOUNTER — Encounter: Payer: Self-pay | Admitting: Gastroenterology

## 2014-12-10 ENCOUNTER — Ambulatory Visit (HOSPITAL_COMMUNITY)
Admission: RE | Admit: 2014-12-10 | Discharge: 2014-12-10 | Disposition: A | Discharge status: Home / Self Care | Payer: Medicaid Other | Source: Ambulatory Visit | Attending: Gastroenterology | Admitting: Gastroenterology

## 2014-12-10 ENCOUNTER — Ambulatory Visit (INDEPENDENT_AMBULATORY_CARE_PROVIDER_SITE_OTHER): Payer: Medicaid Other | Admitting: Gastroenterology

## 2014-12-10 VITALS — BP 145/99 | HR 92 | Temp 97.3°F | Ht 67.0 in | Wt 191.8 lb

## 2014-12-10 DIAGNOSIS — K5900 Constipation, unspecified: Secondary | ICD-10-CM | POA: Diagnosis not present

## 2014-12-10 DIAGNOSIS — R1013 Epigastric pain: Secondary | ICD-10-CM | POA: Insufficient documentation

## 2014-12-10 DIAGNOSIS — G8929 Other chronic pain: Secondary | ICD-10-CM

## 2014-12-10 DIAGNOSIS — R1010 Upper abdominal pain, unspecified: Secondary | ICD-10-CM | POA: Insufficient documentation

## 2014-12-10 LAB — COMPREHENSIVE METABOLIC PANEL
ALT: 27 U/L (ref 17–63)
ANION GAP: 8 (ref 5–15)
AST: 19 U/L (ref 15–41)
Albumin: 3.8 g/dL (ref 3.5–5.0)
Alkaline Phosphatase: 60 U/L (ref 38–126)
BUN: 14 mg/dL (ref 6–20)
CALCIUM: 8.9 mg/dL (ref 8.9–10.3)
CO2: 27 mmol/L (ref 22–32)
Chloride: 102 mmol/L (ref 101–111)
Creatinine, Ser: 1.14 mg/dL (ref 0.61–1.24)
GFR calc non Af Amer: >60 mL/min (ref >60)
Glucose, Bld: 91 mg/dL (ref 65–99)
POTASSIUM: 4.1 mmol/L (ref 3.5–5.1)
SODIUM: 137 mmol/L (ref 135–145)
TOTAL PROTEIN: 7.2 g/dL (ref 6.5–8.1)
Total Bilirubin: 0.4 mg/dL (ref 0.3–1.2)

## 2014-12-10 LAB — CBC
HEMATOCRIT: 46.2 % (ref 39.0–52.0)
Hemoglobin: 15.2 g/dL (ref 13.0–17.0)
MCH: 30.9 pg (ref 26.0–34.0)
MCHC: 32.9 g/dL (ref 30.0–36.0)
MCV: 93.9 fL (ref 78.0–100.0)
Platelets: 225 10*3/uL (ref 150–400)
RBC: 4.92 MIL/uL (ref 4.22–5.81)
RDW: 14.6 % (ref 11.5–15.5)
WBC: 10.7 10*3/uL — ABNORMAL HIGH (ref 4.0–10.5)

## 2014-12-10 LAB — LIPASE, BLOOD: Lipase: 20 U/L (ref 11–51)

## 2014-12-10 MED ORDER — BARIUM SULFATE 2.1 % PO SUSP
ORAL | Status: AC
  Filled 2014-12-10: qty 2

## 2014-12-10 MED ORDER — SUCRALFATE 1 GM/10ML PO SUSP: 1.0000 g | Freq: Four times a day (QID) | ORAL | Status: DC | PRN

## 2014-12-10 MED ORDER — IOHEXOL 300 MG/ML  SOLN
100.0000 mL | Freq: Once | INTRAMUSCULAR | Status: AC | PRN
  Administered 2014-12-10: 100 mL via INTRAVENOUS
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
## 2014-12-10 NOTE — Patient Instructions (Signed)
Start taking Linzess 145 mcg one capsule each morning on an empty stomach. This is for constipation. If this is still too strong, call me. Give it a few days to make sure it is working. You may have diarrhea for a few days but it should get better. If not, call me.  Please have blood work and CT done. We will call with the results.  Continue Dexilant once daily and Carafate as needed.

## 2014-12-10 NOTE — Progress Notes (Signed)
Quick Note:  Lipase, CMP normal. CBC with just mild elevation of white count at 10.7, just marginally above normal. Non-specific. CT without acute findings. Moderate stool burden. May have symptoms secondary to constipation. Unable to rule out biliary etiology, but he won't do the HIDA. Would recommend Linzess for at least a week and see if improvement. Have him call with a progress report in about a week. ______

## 2014-12-24 NOTE — Progress Notes (Signed)
cc'ed to pcp °

## 2014-12-24 NOTE — Assessment & Plan Note (Signed)
Decrease Linzess to 145 mcg daily.

## 2014-12-24 NOTE — Assessment & Plan Note (Signed)
44 year old with chronic abdominal pain in setting of ulcerative/erosive esophagitis, chronic constipation, gallbladder remaining in situ. US abdomen with fatty liver. Unable to complete HIDA stating "I couldn't lay down a long time". Declining HIDA. With persistent symptoms, unable to rule out biliary etiology. No CT on file recently. Proceed with CT and labs now. May refer to general surgery to discuss elective cholecystectomy although I explained this may not relieve his symptoms completely. Continue Dexilant and Carafate.

## 2015-01-31 ENCOUNTER — Other Ambulatory Visit: Payer: Self-pay

## 2015-02-01 MED ORDER — SUCRALFATE 1 GM/10ML PO SUSP: 1.0000 g | Freq: Four times a day (QID) | ORAL | Status: DC | PRN

## 2015-03-18 ENCOUNTER — Other Ambulatory Visit: Payer: Self-pay | Admitting: Nurse Practitioner

## 2015-05-20 ENCOUNTER — Telehealth: Payer: Self-pay | Admitting: Neurology

## 2015-05-20 ENCOUNTER — Ambulatory Visit: Payer: Medicaid Other | Admitting: Neurology

## 2015-05-20 NOTE — Telephone Encounter (Signed)
This patient did not show for a new patient appointment today. 

## 2015-05-22 ENCOUNTER — Encounter: Payer: Self-pay | Admitting: Neurology

## 2015-06-10 ENCOUNTER — Emergency Department (HOSPITAL_COMMUNITY)
Admission: EM | Admit: 2015-06-10 | Discharge: 2015-06-10 | Disposition: A | Discharge status: Home / Self Care | Payer: Medicaid Other | Attending: Emergency Medicine | Admitting: Emergency Medicine

## 2015-06-10 ENCOUNTER — Encounter (HOSPITAL_COMMUNITY): Payer: Self-pay | Admitting: Emergency Medicine

## 2015-06-10 ENCOUNTER — Emergency Department (HOSPITAL_COMMUNITY): Payer: Medicaid Other

## 2015-06-10 DIAGNOSIS — M549 Dorsalgia, unspecified: Secondary | ICD-10-CM

## 2015-06-10 DIAGNOSIS — Y999 Unspecified external cause status: Secondary | ICD-10-CM | POA: Insufficient documentation

## 2015-06-10 DIAGNOSIS — Y9301 Activity, walking, marching and hiking: Secondary | ICD-10-CM | POA: Insufficient documentation

## 2015-06-10 DIAGNOSIS — Y929 Unspecified place or not applicable: Secondary | ICD-10-CM | POA: Diagnosis not present

## 2015-06-10 DIAGNOSIS — M1990 Unspecified osteoarthritis, unspecified site: Secondary | ICD-10-CM | POA: Diagnosis not present

## 2015-06-10 DIAGNOSIS — F329 Major depressive disorder, single episode, unspecified: Secondary | ICD-10-CM | POA: Diagnosis not present

## 2015-06-10 DIAGNOSIS — S43401A Unspecified sprain of right shoulder joint, initial encounter: Secondary | ICD-10-CM

## 2015-06-10 DIAGNOSIS — F17210 Nicotine dependence, cigarettes, uncomplicated: Secondary | ICD-10-CM | POA: Diagnosis not present

## 2015-06-10 DIAGNOSIS — W1830XA Fall on same level, unspecified, initial encounter: Secondary | ICD-10-CM | POA: Diagnosis not present

## 2015-06-10 DIAGNOSIS — G8929 Other chronic pain: Secondary | ICD-10-CM | POA: Insufficient documentation

## 2015-06-10 DIAGNOSIS — M545 Low back pain: Secondary | ICD-10-CM | POA: Insufficient documentation

## 2015-06-10 DIAGNOSIS — M25511 Pain in right shoulder: Secondary | ICD-10-CM | POA: Diagnosis present

## 2015-06-10 MED ORDER — DIAZEPAM 5 MG PO TABS
5.0000 mg | ORAL_TABLET | Freq: Two times a day (BID) | ORAL | Status: DC
Start: 2015-06-10 — End: 2016-11-22

## 2015-06-10 MED ORDER — DIAZEPAM 5 MG PO TABS
5.0000 mg | ORAL_TABLET | Freq: Once | ORAL | Status: AC
  Administered 2015-06-10: 5 mg via ORAL
  Filled 2015-06-10: qty 1

## 2015-06-10 MED ORDER — KETOROLAC TROMETHAMINE 60 MG/2ML IM SOLN
60.0000 mg | Freq: Once | INTRAMUSCULAR | Status: AC
  Administered 2015-06-10: 60 mg via INTRAMUSCULAR
  Filled 2015-06-10: qty 2

## 2015-06-10 MED ORDER — DICLOFENAC SODIUM 75 MG PO TBEC: 75.0000 mg | DELAYED_RELEASE_TABLET | Freq: Two times a day (BID) | ORAL | Status: DC

## 2015-06-10 NOTE — Discharge Instructions (Signed)
Chronic Back Pain  When back pain lasts longer than 3 months, it is called chronic back pain.People with chronic back pain often go through certain periods that are more intense (flare-ups).  CAUSES Chronic back pain can be caused by wear and tear (degeneration) on different structures in your back. These structures include:  The bones of your spine (vertebrae) and the joints surrounding your spinal cord and nerve roots (facets).  The strong, fibrous tissues that connect your vertebrae (ligaments). Degeneration of these structures may result in pressure on your nerves. This can lead to constant pain. HOME CARE INSTRUCTIONS  Avoid bending, heavy lifting, prolonged sitting, and activities which make the problem worse.  Take brief periods of rest throughout the day to reduce your pain. Lying down or standing usually is better than sitting while you are resting.  Take over-the-counter or prescription medicines only as directed by your caregiver. SEEK IMMEDIATE MEDICAL CARE IF:   You have weakness or numbness in one of your legs or feet.  You have trouble controlling your bladder or bowels.  You have nausea, vomiting, abdominal pain, shortness of breath, or fainting.   This information is not intended to replace advice given to you by your health care provider. Make sure you discuss any questions you have with your health care provider.   Document Released: 02/13/2004 Document Revised: 03/30/2011 Document Reviewed: 06/25/2014 Elsevier Interactive Patient Education 2016 Elsevier Inc[employer]  Shoulder Sprain A shoulder sprain is a partial or complete tear in one of the tough, fiber-like tissues (ligaments) in the shoulder. The ligaments in the shoulder help to hold the shoulder in place. CAUSES This condition may be caused by:  A fall.  A hit to the shoulder.  A twist of the arm. RISK FACTORS This condition is more likely to develop in:  People who play sports.  People who have  problems with balance or coordination. SYMPTOMS Symptoms of this condition include:  Pain when moving the shoulder.  Limited ability to move the shoulder.  Swelling and tenderness on top of the shoulder.  Warmth in the shoulder.  A change in the shape of the shoulder.  Redness or bruising on the shoulder. DIAGNOSIS This condition is diagnosed with a physical exam. During the exam, you may be asked to do simple exercises with your shoulder. You may also have imaging tests, such as X-rays, MRI, or a CT scan. These tests can show how severe the sprain is. TREATMENT This condition may be treated with:  Rest.  Pain medicine.  Ice.  A sling or brace. This is used to keep the arm still while the shoulder is healing.  Physical therapy or rehabilitation exercises. These help to improve the range of motion and strength of the shoulder.  Surgery (rare). Surgery may be needed if the sprain caused a joint to become unstable. Surgery may also be needed to reduce pain. Some people may develop ongoing shoulder pain or lose some range of motion in the shoulder. However, most people do not develop long-term problems. HOME CARE INSTRUCTIONS  Rest.  Take over-the-counter and prescription medicines only as told by your health care provider.  If directed, apply ice to the area:  Put ice in a plastic bag.  Place a towel between your skin and the bag.  Leave the ice on for 20 minutes, 2-3 times per day.  If you were given a shoulder sling or brace:  Wear it as told.  Remove it to shower or bathe.  Move your  arm only as much as told by your health care provider, but keep your hand moving to prevent swelling.  If you were shown how to do any exercises, do them as told by your health care provider.  Keep all follow-up visits as told by your health care provider. This is important. SEEK MEDICAL CARE IF:  Your pain gets worse.  Your pain is not relieved with medicines.  You have  increased redness or swelling. SEEK IMMEDIATE MEDICAL CARE IF:  You have a fever.  You cannot move your arm or shoulder.  You develop numbness or tingling in your arms, hands, or fingers.   This information is not intended to replace advice given to you by your health care provider. Make sure you discuss any questions you have with your health care provider.   Document Released: 05/24/2008 Document Revised: 09/26/2014 Document Reviewed: 04/30/2014 Elsevier Interactive Patient Education [employer].

## 2015-06-10 NOTE — ED Notes (Signed)
Pt alert & oriented x4, stable gait. Patient given discharge instructions, paperwork & prescription(s). Patient  instructed to stop at the registration desk to finish any additional paperwork. Patient verbalized understanding. Pt left department w/ no further questions. 
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
## 2015-06-10 NOTE — ED Notes (Addendum)
Patient complaining of back pain x 4-5 days after falling. States "I was going to the pain clinic but I was let go so I don't have anything to take." States he took percocet at 1230 and hydrocodone right before arrival to ER.

## 2015-07-13 ENCOUNTER — Other Ambulatory Visit: Payer: Self-pay | Admitting: Gastroenterology

## 2015-12-16 ENCOUNTER — Encounter (HOSPITAL_COMMUNITY): Payer: Self-pay | Admitting: Emergency Medicine

## 2015-12-16 ENCOUNTER — Emergency Department (HOSPITAL_COMMUNITY)
Admission: EM | Admit: 2015-12-16 | Discharge: 2015-12-16 | Disposition: A | Discharge status: Home / Self Care | Payer: Medicaid Other | Attending: Emergency Medicine | Admitting: Emergency Medicine

## 2015-12-16 ENCOUNTER — Emergency Department (HOSPITAL_COMMUNITY): Payer: Medicaid Other

## 2015-12-16 DIAGNOSIS — R0602 Shortness of breath: Secondary | ICD-10-CM | POA: Insufficient documentation

## 2015-12-16 DIAGNOSIS — L03211 Cellulitis of face: Secondary | ICD-10-CM

## 2015-12-16 DIAGNOSIS — F17210 Nicotine dependence, cigarettes, uncomplicated: Secondary | ICD-10-CM | POA: Insufficient documentation

## 2015-12-16 DIAGNOSIS — M549 Dorsalgia, unspecified: Secondary | ICD-10-CM | POA: Diagnosis not present

## 2015-12-16 DIAGNOSIS — K0889 Other specified disorders of teeth and supporting structures: Secondary | ICD-10-CM

## 2015-12-16 DIAGNOSIS — R220 Localized swelling, mass and lump, head: Secondary | ICD-10-CM | POA: Diagnosis present

## 2015-12-16 LAB — I-STAT CHEM 8, ED
BUN: 9 mg/dL (ref 6–20)
CALCIUM ION: 1.09 mmol/L — AB (ref 1.15–1.40)
Chloride: 103 mmol/L (ref 101–111)
Creatinine, Ser: 1.2 mg/dL (ref 0.61–1.24)
GLUCOSE: 86 mg/dL (ref 65–99)
HCT: 51 % (ref 39.0–52.0)
Hemoglobin: 17.3 g/dL — ABNORMAL HIGH (ref 13.0–17.0)
Potassium: 4.2 mmol/L (ref 3.5–5.1)
SODIUM: 138 mmol/L (ref 135–145)
TCO2: 24 mmol/L (ref 0–100)

## 2015-12-16 MED ORDER — CLINDAMYCIN PHOSPHATE 300 MG/50ML IV SOLN
300.0000 mg | Freq: Once | INTRAVENOUS | Status: AC
  Administered 2015-12-16: 300 mg via INTRAVENOUS
  Filled 2015-12-16: qty 50

## 2015-12-16 MED ORDER — SODIUM CHLORIDE 0.9 % IV BOLUS (SEPSIS)
500.0000 mL | Freq: Once | INTRAVENOUS | Status: AC
  Administered 2015-12-16: 500 mL via INTRAVENOUS

## 2015-12-16 MED ORDER — IOPAMIDOL (ISOVUE-300) INJECTION 61%
75.0000 mL | Freq: Once | INTRAVENOUS | Status: AC | PRN
  Administered 2015-12-16: 75 mL via INTRAVENOUS

## 2015-12-16 MED ORDER — NAPROXEN 500 MG PO TABS: 500.0000 mg | ORAL_TABLET | Freq: Two times a day (BID) | ORAL | 0 refills | Status: DC

## 2015-12-16 MED ORDER — SODIUM CHLORIDE 0.9 % IV SOLN: INTRAVENOUS | Status: DC

## 2015-12-16 MED ORDER — HYDROCODONE-ACETAMINOPHEN 5-325 MG PO TABS: 1.0000 | ORAL_TABLET | Freq: Four times a day (QID) | ORAL | 0 refills | Status: DC | PRN

## 2015-12-16 MED ORDER — PENICILLIN V POTASSIUM 500 MG PO TABS: 500.0000 mg | ORAL_TABLET | Freq: Four times a day (QID) | ORAL | 0 refills | Status: DC

## 2015-12-16 NOTE — ED Triage Notes (Signed)
Pt states his tongue started swelling last night, has had difficulty talking. Pt c/o dental pain.

## 2015-12-16 NOTE — Discharge Instructions (Signed)
Recommend follow-up with dentist. Take the penicillin antibiotic as directed for the next 7 days. Take the Naprosyn on a regular basis. Supplement with hydrocodone as needed. Return for any worse swelling of the tongue or floor the mouth. CT scan here today without any evidence of a abscess cavity or significant swelling to the floor the mouth.
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
## 2016-01-21 ENCOUNTER — Other Ambulatory Visit: Payer: Self-pay | Admitting: Nurse Practitioner

## 2016-08-03 ENCOUNTER — Other Ambulatory Visit: Payer: Self-pay | Admitting: Gastroenterology

## 2016-08-04 NOTE — Telephone Encounter (Signed)
LMOM to call.

## 2016-08-04 NOTE — Telephone Encounter (Signed)
PT is aware and Manuela Schwartz is scheduling him an appt.

## 2016-08-04 NOTE — Telephone Encounter (Signed)
Please tell the patient I can send in a limited refill (3-4 months) but at that point it will have been 2 years since we've seen him and he will need a followup OV for further refills.

## 2016-09-29 ENCOUNTER — Ambulatory Visit: Payer: Medicaid Other | Admitting: Gastroenterology

## 2016-09-29 ENCOUNTER — Telehealth: Payer: Self-pay | Admitting: Gastroenterology

## 2016-09-29 ENCOUNTER — Encounter: Payer: Self-pay | Admitting: Gastroenterology

## 2016-09-29 NOTE — Telephone Encounter (Signed)
PATIENT WAS A NO SHOW AND LETTER SENT  °

## 2016-10-15 ENCOUNTER — Encounter (HOSPITAL_COMMUNITY): Payer: Self-pay | Admitting: Emergency Medicine

## 2016-10-15 ENCOUNTER — Emergency Department (HOSPITAL_COMMUNITY)
Admission: EM | Admit: 2016-10-15 | Discharge: 2016-10-15 | Disposition: A | Discharge status: Home / Self Care | Payer: Medicaid Other | Attending: Emergency Medicine | Admitting: Emergency Medicine

## 2016-10-15 DIAGNOSIS — F22 Delusional disorders: Secondary | ICD-10-CM | POA: Diagnosis not present

## 2016-10-15 DIAGNOSIS — F17210 Nicotine dependence, cigarettes, uncomplicated: Secondary | ICD-10-CM | POA: Diagnosis not present

## 2016-10-15 DIAGNOSIS — R443 Hallucinations, unspecified: Secondary | ICD-10-CM | POA: Diagnosis present

## 2016-10-15 DIAGNOSIS — F1994 Other psychoactive substance use, unspecified with psychoactive substance-induced mood disorder: Secondary | ICD-10-CM | POA: Diagnosis not present

## 2016-10-15 DIAGNOSIS — Z79899 Other long term (current) drug therapy: Secondary | ICD-10-CM | POA: Insufficient documentation

## 2016-10-15 LAB — CBC WITH DIFFERENTIAL/PLATELET
Basophils Absolute: 0 10*3/uL (ref 0.0–0.1)
Basophils Relative: 0 %
Eosinophils Absolute: 0 10*3/uL (ref 0.0–0.7)
Eosinophils Relative: 0 %
HCT: 52 % (ref 39.0–52.0)
Hemoglobin: 17.9 g/dL — ABNORMAL HIGH (ref 13.0–17.0)
Lymphocytes Relative: 23 %
Lymphs Abs: 2.1 10*3/uL (ref 0.7–4.0)
MCH: 30.2 pg (ref 26.0–34.0)
MCHC: 34.4 g/dL (ref 30.0–36.0)
MCV: 87.7 fL (ref 78.0–100.0)
Monocytes Absolute: 0.6 10*3/uL (ref 0.1–1.0)
Monocytes Relative: 6 %
Neutro Abs: 6.6 10*3/uL (ref 1.7–7.7)
Neutrophils Relative %: 71 %
Platelets: 169 10*3/uL (ref 150–400)
RBC: 5.93 MIL/uL — ABNORMAL HIGH (ref 4.22–5.81)
RDW: 13.6 % (ref 11.5–15.5)
WBC: 9.3 10*3/uL (ref 4.0–10.5)

## 2016-10-15 LAB — COMPREHENSIVE METABOLIC PANEL
ALT: 19 U/L (ref 17–63)
AST: 20 U/L (ref 15–41)
Albumin: 4.6 g/dL (ref 3.5–5.0)
Alkaline Phosphatase: 68 U/L (ref 38–126)
Anion gap: 11 (ref 5–15)
BUN: 18 mg/dL (ref 6–20)
CO2: 22 mmol/L (ref 22–32)
Calcium: 9.8 mg/dL (ref 8.9–10.3)
Chloride: 110 mmol/L (ref 101–111)
Creatinine, Ser: 1.05 mg/dL (ref 0.61–1.24)
GFR calc Af Amer: >60 mL/min (ref >60)
GFR calc non Af Amer: >60 mL/min (ref >60)
Glucose, Bld: 116 mg/dL — ABNORMAL HIGH (ref 65–99)
Potassium: 3.8 mmol/L (ref 3.5–5.1)
Sodium: 143 mmol/L (ref 135–145)
Total Bilirubin: 1.3 mg/dL — ABNORMAL HIGH (ref 0.3–1.2)
Total Protein: 8.4 g/dL — ABNORMAL HIGH (ref 6.5–8.1)

## 2016-10-15 LAB — RAPID URINE DRUG SCREEN, HOSP PERFORMED
Amphetamines: NOT DETECTED
Barbiturates: NOT DETECTED
Benzodiazepines: NOT DETECTED
Cocaine: POSITIVE — AB
Opiates: NOT DETECTED
Tetrahydrocannabinol: POSITIVE — AB

## 2016-10-15 LAB — ETHANOL: Alcohol, Ethyl (B): <10 mg/dL (ref <10)
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
## 2016-10-15 NOTE — ED Notes (Signed)
ED Provider at bedside. 

## 2016-10-15 NOTE — ED Triage Notes (Signed)
Pt reports he has been hearing things other people aren't hearing since taking too many lyrica x 3 days ago. Pt also reports being paranoid that someone was harming his cat. Denies si/hi.

## 2016-11-17 ENCOUNTER — Encounter (HOSPITAL_COMMUNITY): Payer: Self-pay | Admitting: *Deleted

## 2016-11-17 ENCOUNTER — Emergency Department (HOSPITAL_COMMUNITY): Payer: Medicaid Other

## 2016-11-17 ENCOUNTER — Emergency Department (HOSPITAL_COMMUNITY)
Admission: EM | Admit: 2016-11-17 | Discharge: 2016-11-18 | Disposition: A | Discharge status: Home / Self Care | Payer: Medicaid Other | Attending: Emergency Medicine | Admitting: Emergency Medicine

## 2016-11-17 DIAGNOSIS — F17210 Nicotine dependence, cigarettes, uncomplicated: Secondary | ICD-10-CM | POA: Diagnosis not present

## 2016-11-17 DIAGNOSIS — Z791 Long term (current) use of non-steroidal anti-inflammatories (NSAID): Secondary | ICD-10-CM | POA: Diagnosis not present

## 2016-11-17 DIAGNOSIS — Z79899 Other long term (current) drug therapy: Secondary | ICD-10-CM | POA: Insufficient documentation

## 2016-11-17 DIAGNOSIS — F22 Delusional disorders: Secondary | ICD-10-CM

## 2016-11-17 DIAGNOSIS — R440 Auditory hallucinations: Secondary | ICD-10-CM

## 2016-11-17 DIAGNOSIS — G8929 Other chronic pain: Secondary | ICD-10-CM | POA: Diagnosis not present

## 2016-11-17 LAB — CBC
HEMATOCRIT: 51.7 % (ref 39.0–52.0)
HEMOGLOBIN: 18.1 g/dL — AB (ref 13.0–17.0)
MCH: 29.7 pg (ref 26.0–34.0)
MCHC: 35 g/dL (ref 30.0–36.0)
MCV: 84.9 fL (ref 78.0–100.0)
Platelets: 229 10*3/uL (ref 150–400)
RBC: 6.09 MIL/uL — ABNORMAL HIGH (ref 4.22–5.81)
RDW: 13.1 % (ref 11.5–15.5)
WBC: 12.1 10*3/uL — ABNORMAL HIGH (ref 4.0–10.5)

## 2016-11-17 LAB — ACETAMINOPHEN LEVEL: Acetaminophen (Tylenol), Serum: <10 ug/mL — ABNORMAL LOW (ref 10–30)

## 2016-11-17 LAB — RAPID URINE DRUG SCREEN, HOSP PERFORMED
Amphetamines: NOT DETECTED
BARBITURATES: NOT DETECTED
Benzodiazepines: NOT DETECTED
Cocaine: NOT DETECTED
Opiates: NOT DETECTED
TETRAHYDROCANNABINOL: NOT DETECTED

## 2016-11-17 LAB — COMPREHENSIVE METABOLIC PANEL
ALBUMIN: 4.3 g/dL (ref 3.5–5.0)
ALT: 19 U/L (ref 17–63)
AST: 17 U/L (ref 15–41)
Alkaline Phosphatase: 70 U/L (ref 38–126)
Anion gap: 11 (ref 5–15)
BUN: 5 mg/dL — AB (ref 6–20)
CHLORIDE: 107 mmol/L (ref 101–111)
CO2: 21 mmol/L — ABNORMAL LOW (ref 22–32)
Calcium: 9.6 mg/dL (ref 8.9–10.3)
Creatinine, Ser: 1.14 mg/dL (ref 0.61–1.24)
GFR calc Af Amer: >60 mL/min (ref >60)
GFR calc non Af Amer: >60 mL/min (ref >60)
GLUCOSE: 103 mg/dL — AB (ref 65–99)
POTASSIUM: 3.1 mmol/L — AB (ref 3.5–5.1)
SODIUM: 139 mmol/L (ref 135–145)
Total Bilirubin: 1.3 mg/dL — ABNORMAL HIGH (ref 0.3–1.2)
Total Protein: 7.2 g/dL (ref 6.5–8.1)

## 2016-11-17 LAB — ETHANOL: Alcohol, Ethyl (B): <10 mg/dL (ref <10)

## 2016-11-17 MED ORDER — IBUPROFEN 400 MG PO TABS: 600.0000 mg | ORAL_TABLET | Freq: Three times a day (TID) | ORAL | Status: DC | PRN

## 2016-11-17 MED ORDER — NICOTINE 21 MG/24HR TD PT24
21.0000 mg | MEDICATED_PATCH | Freq: Every day | TRANSDERMAL | Status: DC
  Administered 2016-11-17: 21 mg via TRANSDERMAL
  Filled 2016-11-17: qty 1

## 2016-11-17 MED ORDER — ALUM & MAG HYDROXIDE-SIMETH 200-200-20 MG/5ML PO SUSP: 30.0000 mL | Freq: Four times a day (QID) | ORAL | Status: DC | PRN

## 2016-11-17 MED ORDER — POTASSIUM CHLORIDE CRYS ER 20 MEQ PO TBCR
40.0000 meq | EXTENDED_RELEASE_TABLET | Freq: Once | ORAL | Status: AC
  Administered 2016-11-17: 40 meq via ORAL
  Filled 2016-11-17: qty 2

## 2016-11-17 MED ORDER — BUPRENORPHINE 5 MCG/HR TD PTWK: 5.0000 ug | MEDICATED_PATCH | TRANSDERMAL | Status: DC

## 2016-11-17 MED ORDER — PANTOPRAZOLE SODIUM 40 MG PO TBEC
40.0000 mg | DELAYED_RELEASE_TABLET | Freq: Every day | ORAL | Status: DC
  Administered 2016-11-17: 40 mg via ORAL
  Filled 2016-11-17: qty 1

## 2016-11-17 MED ORDER — HYDROXYZINE HCL 25 MG PO TABS
25.0000 mg | ORAL_TABLET | Freq: Three times a day (TID) | ORAL | Status: DC | PRN
  Administered 2016-11-18: 25 mg via ORAL
  Filled 2016-11-17: qty 1

## 2016-11-17 MED ORDER — CITALOPRAM HYDROBROMIDE 10 MG PO TABS
20.0000 mg | ORAL_TABLET | Freq: Every day | ORAL | Status: DC
  Administered 2016-11-17: 20 mg via ORAL
  Filled 2016-11-17: qty 2

## 2016-11-17 MED ORDER — ONDANSETRON HCL 4 MG PO TABS: 4.0000 mg | ORAL_TABLET | Freq: Three times a day (TID) | ORAL | Status: DC | PRN

## 2016-11-17 MED ORDER — ALBUTEROL SULFATE HFA 108 (90 BASE) MCG/ACT IN AERS: 1.0000 | INHALATION_SPRAY | RESPIRATORY_TRACT | Status: DC | PRN

## 2016-11-17 NOTE — ED Notes (Signed)
Pt laughing and talking w/Sitter - does not appear to be responding to any internal stimuli - no self-injurious behaviors noted.

## 2016-11-17 NOTE — ED Notes (Signed)
Called pharmacy for med req

## 2016-11-17 NOTE — ED Notes (Signed)
Dinner ordered for pt

## 2016-11-17 NOTE — ED Notes (Signed)
Pt wearing burgundy scrubs and glasses - ambulatory to Pod F w/Sitter. Per report, pt's belonging have been inventoried - Placed in [place] #2 - No valuables. Pt alert, oriented, calm, and cooperative. Pt aware waiting for TTS. Pt verbalized understanding and signed Medical Clearance Pt Policy form - copy given to pt.

## 2016-11-17 NOTE — ED Triage Notes (Signed)
Pt in c/o hearing voices unknown onset, pt states, "I heard my brother I thought outside my window trying to get me." Pt denies SI, pt states, "I took a lot of Lyrica a month ago because I don't have any money." pt sees pain management, pt A&O x4, denies SI &  HI at this time, calm & cooperative
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
## 2016-11-18 ENCOUNTER — Encounter (HOSPITAL_COMMUNITY): Payer: Self-pay | Admitting: Registered Nurse

## 2016-11-18 NOTE — ED Notes (Signed)
Pt made one phone call lasting 5 minutes.

## 2016-11-18 NOTE — Consult Note (Signed)
  Benjamin [name], 46 y.o., male patient presented to [facility] with complaints of auditory hallucinations.  Patient was seen one month ago with same complaints but at this time states that hallucinations has worsened.  Patient seen via telepsych by this provider on 11/18/16.  Chart reviewed and consulted with Dr. Dwyane Dee.  On evaluation Benjamin [name] reports that he has not been sleeping well for the last couple of days.  States that he had heard voices outside of his window one of the voices being his brother and thought his brother was going to harm him.  Patient states that he has heard no voices since he has been in the hospital and that he is feeling "pretty good, real good actually."  Patient reports that he lives with his parents and that he has outpatient services with [facility] and next appointment is next week.  States that he is taking vistaril and Celexa which he is compliant but is taking nothing for sleep since the Ambien was stopped.  Patient denies suicidal/homicidal ideation, psychosis, and paranoia at this time.  States that he has gotten some sleep since being in the hospital.  "I slept pretty much all day yesterday and half of the night last night; I guess I was pretty tired."  Patient states that he feels safe going home and is no longer feeling paranoid or hearing voices.    During assessment patient was laying in bed with pleasant affect.  Patient was alert/oriented x 4, calm/cooperative; he did not appear to be responding to internal/external stimuli; and he responded to questioning appropriately.   Recommendation: Keep scheduled appointment with [facility].  Take Vistaril 25 mg at bed time to see if help with sleep.  Speak with his outpatient provider about insomnia and needing something to help him sleep since his Ambien was stopped.    Disposition: No evidence of imminent risk to self or others at present.   Patient does not meet criteria for psychiatric inpatient  admission. Supportive therapy provided about ongoing stressors. Discussed crisis plan, support from social network, calling 911, coming to the Emergency Department, and calling Suicide Hotline.  Patient has been psychiatrically cleared.

## 2016-11-18 NOTE — ED Notes (Signed)
Patient was given a snack and Drink, and A Regular Diet was taken for Lunch.
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
## 2016-11-18 NOTE — ED Notes (Signed)
Pt gone to shower with sitter.

## 2016-11-18 NOTE — ED Notes (Signed)
Regular Diet ordered for Lunch. 

## 2016-11-18 NOTE — ED Notes (Signed)
TTS begun 

## 2016-11-18 NOTE — ED Notes (Signed)
Pt requested "something to help me sleep."  Pt no agitated, just wants to sleep.  Provider, Dr. Christy Gentles called, OKed Vistaril for sleep.  Given

## 2016-11-18 NOTE — Progress Notes (Signed)
Per Earleen Newport, NP, the patient does not meet criteria for inpatient treatment. The patient is recommended for discharge and to follow up with [facility]. The patient reports he has an upcoming appointment with [facility].   The patient is psychiatrically cleared.   Domenic Moras, RN notified.     Radonna Ricker MSW, [place] Disposition [phone]

## 2016-11-18 NOTE — ED Notes (Signed)
Pt took all of belongings with him at d/c including tenz unit

## 2016-11-18 NOTE — ED Notes (Signed)
Pt DC home with mother. AVS reviewed and pt instructed to keep follow up appointment at [facility] on Monday at 3p. Pt also instructed to take vistiril 25mg  at bedtime to help with insomnia and to speak with outpatient provider about insomnia. Pt verbalized understanding and signed no harm contract. Ambulatory out of department with mother.

## 2016-11-18 NOTE — Discharge Instructions (Signed)
Follow-up with information provided by behavioral health evaluators

## 2016-11-18 NOTE — ED Notes (Signed)
Pt cooperating with TTS at this time.

## 2016-11-21 ENCOUNTER — Encounter (HOSPITAL_COMMUNITY): Payer: Self-pay | Admitting: Emergency Medicine

## 2016-11-21 ENCOUNTER — Emergency Department (HOSPITAL_COMMUNITY)
Admission: EM | Admit: 2016-11-21 | Discharge: 2016-11-22 | Disposition: A | Discharge status: Other Acute Inpatient Hospital | Payer: Medicaid Other | Attending: Emergency Medicine | Admitting: Emergency Medicine

## 2016-11-21 DIAGNOSIS — Z79899 Other long term (current) drug therapy: Secondary | ICD-10-CM | POA: Insufficient documentation

## 2016-11-21 DIAGNOSIS — F22 Delusional disorders: Secondary | ICD-10-CM | POA: Diagnosis not present

## 2016-11-21 DIAGNOSIS — R45850 Homicidal ideations: Secondary | ICD-10-CM | POA: Diagnosis not present

## 2016-11-21 DIAGNOSIS — F17210 Nicotine dependence, cigarettes, uncomplicated: Secondary | ICD-10-CM | POA: Insufficient documentation

## 2016-11-21 LAB — CBC WITH DIFFERENTIAL/PLATELET
BASOS ABS: 0 10*3/uL (ref 0.0–0.1)
BASOS PCT: 0 %
EOS ABS: 0.1 10*3/uL (ref 0.0–0.7)
EOS PCT: 1 %
HEMATOCRIT: 49.7 % (ref 39.0–52.0)
Hemoglobin: 16.9 g/dL (ref 13.0–17.0)
Lymphocytes Relative: 25 %
Lymphs Abs: 3.5 10*3/uL (ref 0.7–4.0)
MCH: 29.6 pg (ref 26.0–34.0)
MCHC: 34 g/dL (ref 30.0–36.0)
MCV: 87 fL (ref 78.0–100.0)
MONO ABS: 0.8 10*3/uL (ref 0.1–1.0)
Monocytes Relative: 6 %
NEUTROS ABS: 9.5 10*3/uL — AB (ref 1.7–7.7)
Neutrophils Relative %: 68 %
PLATELETS: 187 10*3/uL (ref 150–400)
RBC: 5.71 MIL/uL (ref 4.22–5.81)
RDW: 13.3 % (ref 11.5–15.5)
WBC: 13.8 10*3/uL — ABNORMAL HIGH (ref 4.0–10.5)

## 2016-11-21 LAB — COMPREHENSIVE METABOLIC PANEL
ALK PHOS: 63 U/L (ref 38–126)
ALT: 20 U/L (ref 17–63)
AST: 16 U/L (ref 15–41)
Albumin: 4.2 g/dL (ref 3.5–5.0)
Anion gap: 12 (ref 5–15)
BUN: 12 mg/dL (ref 6–20)
CALCIUM: 9.2 mg/dL (ref 8.9–10.3)
CHLORIDE: 108 mmol/L (ref 101–111)
CO2: 24 mmol/L (ref 22–32)
CREATININE: 1.09 mg/dL (ref 0.61–1.24)
GFR calc Af Amer: >60 mL/min (ref >60)
GFR calc non Af Amer: >60 mL/min (ref >60)
Glucose, Bld: 97 mg/dL (ref 65–99)
Potassium: 3.8 mmol/L (ref 3.5–5.1)
SODIUM: 144 mmol/L (ref 135–145)
Total Bilirubin: 0.6 mg/dL (ref 0.3–1.2)
Total Protein: 7.1 g/dL (ref 6.5–8.1)

## 2016-11-21 LAB — RAPID URINE DRUG SCREEN, HOSP PERFORMED
AMPHETAMINES: NOT DETECTED
BARBITURATES: NOT DETECTED
Benzodiazepines: NOT DETECTED
COCAINE: NOT DETECTED
Opiates: NOT DETECTED
TETRAHYDROCANNABINOL: NOT DETECTED

## 2016-11-21 LAB — ETHANOL: Alcohol, Ethyl (B): <10 mg/dL (ref <10)

## 2016-11-21 MED ORDER — NICOTINE 21 MG/24HR TD PT24
21.0000 mg | MEDICATED_PATCH | Freq: Once | TRANSDERMAL | Status: AC
Start: 2016-11-21 — End: 2016-11-22
  Administered 2016-11-21: 21 mg via TRANSDERMAL
  Filled 2016-11-21: qty 1

## 2016-11-21 MED ORDER — PANTOPRAZOLE SODIUM 40 MG PO TBEC
40.0000 mg | DELAYED_RELEASE_TABLET | Freq: Every day | ORAL | Status: DC
  Administered 2016-11-21 – 2016-11-22 (×2): 40 mg via ORAL
  Filled 2016-11-21 (×2): qty 1

## 2016-11-21 MED ORDER — IBUPROFEN 800 MG PO TABS
800.0000 mg | ORAL_TABLET | Freq: Four times a day (QID) | ORAL | Status: DC | PRN
  Administered 2016-11-21 – 2016-11-22 (×2): 800 mg via ORAL
  Filled 2016-11-21 (×2): qty 1

## 2016-11-21 MED ORDER — CITALOPRAM HYDROBROMIDE 20 MG PO TABS
20.0000 mg | ORAL_TABLET | Freq: Every day | ORAL | Status: DC
  Administered 2016-11-21 – 2016-11-22 (×2): 20 mg via ORAL
  Filled 2016-11-21 (×4): qty 1

## 2016-11-21 MED ORDER — ACETAMINOPHEN 325 MG PO TABS: 650.0000 mg | ORAL_TABLET | Freq: Four times a day (QID) | ORAL | Status: DC | PRN

## 2016-11-21 NOTE — ED Notes (Signed)
Pt given meal tray.

## 2016-11-21 NOTE — ED Notes (Signed)
TTS in progress 
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
## 2016-11-21 NOTE — ED Notes (Signed)
Patient states he has heard his family talk about committing him so that they will get money from him being committed. He states they want money so they can go to [place] .

## 2016-11-22 NOTE — Progress Notes (Signed)
Pt referred to the following hospitals for inpatient psychiatric treatment: [facility]  Also considered for admission to [facility] upon bed availability  Sharren Bridge, MSW, LCSW Clinical Social Work 11/22/2016 Coverage for [phone]

## 2016-11-22 NOTE — ED Notes (Addendum)
Call from [facility] re:  Butrans patches  Pt reports he has been out of patches and not worn for the last 2 weeks

## 2016-11-22 NOTE — Progress Notes (Signed)
Pt accepted to [facility] by Dr. Brantley Fling, report [phone].  Per Dellis Filbert in admissions, buprenorphine patch cannot be provided at [facility]. Pt would either have to supply his own or have been off of it and not having withdrawals. CSW spoke with RN at [facility] who confirmed with pt he has not had patch x2 weeks.  Sharren Bridge, MSW, LCSW Clinical Social Work 11/22/2016 Coverage for [phone]

## 2016-11-22 NOTE — ED Notes (Signed)
[facility] called and notified pt is ready for transport to [place] notifying [place] department.

## 2016-11-22 NOTE — ED Notes (Signed)
officer here for transport- However,  IVC paperwork has yet to be received Civil engineer, contracting has left and will return when called

## 2016-11-22 NOTE — ED Notes (Signed)
Out of ed to [facility]

## 2016-11-22 NOTE — ED Notes (Signed)
Faxed IVC Paperwork to [facility]

## 2016-11-22 NOTE — ED Notes (Signed)
Report to Julie, RN.

## 2016-11-22 NOTE — ED Notes (Signed)
Call from [facility] pt has been accepted at [facility]  Legrand Rams :[phone]

## 2016-11-22 NOTE — BHH Counselor (Signed)
Writer spoke w/ Almyra Free at [facility] [phone]. Almyra Free hadn't received writer's faxed IVC to [phone], so writer resent it to number # [phone].  Arnold Long, [place] Therapeutic Triage Specialist

## 2016-11-22 NOTE — ED Notes (Signed)
Dr Roderic Palau eval  Officer [place] or [place], [place] transporting

## 2016-11-22 NOTE — BHH Counselor (Signed)
Benjamin [name] is an 46 y.o. male who presented to [facility] ED on IVC on 11/21/16.  He was just assessed by TTS 3 days ago for a similar presentation and was discharged from the ED to follow-up with [facility]. Patient states that he has been hearing voices, not command in nature, but states that he hears his brother talking to him and sees him looking through his bedroom window.  Patient states that he has been hearing people talk about [place] and he does not even know where that is.  Today he states that there was a piece of foil on the bathroom floor that he believes was transmitting messages.  Patient states that he believes that his room has cameras and listening devices in it because people ar hearing what he is saying.  Patient states that he has never been diagnosed or treated for psychosis in the past.  He states that he does have a history of depression and he is on Celexa for his depression.  He states that he is being seen at [facility] for outpatient treatment, but denies and any previous hospitalizations.  Pt was re-assessed.  Pt reported that he was not hearing voices today.  Pt's demeanor was somewhat guarded and he denied believing that foil found on the bathroom floor was transmitting messages to him.  Pt said he could not remember what brought him into the hospital.

## 2016-11-22 NOTE — ED Notes (Signed)
Awaiting call from [place] to get report

## 2016-11-22 NOTE — ED Notes (Signed)
Call to [place]

## 2016-12-01 ENCOUNTER — Encounter: Payer: Self-pay | Admitting: Gastroenterology

## 2016-12-01 ENCOUNTER — Ambulatory Visit: Payer: Medicaid Other | Admitting: Gastroenterology

## 2016-12-01 VITALS — BP 140/90 | HR 95 | Temp 97.1°F | Ht 67.0 in | Wt 168.4 lb

## 2016-12-01 DIAGNOSIS — G8929 Other chronic pain: Secondary | ICD-10-CM

## 2016-12-01 DIAGNOSIS — R1013 Epigastric pain: Secondary | ICD-10-CM

## 2016-12-01 MED ORDER — DEXLANSOPRAZOLE 60 MG PO CPDR: 1.0000 | DELAYED_RELEASE_CAPSULE | Freq: Every day | ORAL | 3 refills | Status: DC
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
## 2016-12-01 NOTE — Patient Instructions (Signed)
I have refilled the [place] for you.  The area that you feel in your upper belly is part of your sternum.   We will see you back in 6 months!

## 2016-12-03 NOTE — Progress Notes (Signed)
cc'ed to pcp °

## 2016-12-03 NOTE — Assessment & Plan Note (Signed)
46 year old male with chronic abdominal pain at baseline. Thorough evaluation when seen several years ago. Known history of esophagitis. Continue Dexilant, with refills provided today. As of note, "knot" that patient reports in abdomen is actually his xiphoid process. Discussed this with patient. No alarm symptoms. Return in 6 months.

## 2017-04-07 ENCOUNTER — Telehealth: Payer: Self-pay

## 2017-04-07 NOTE — Telephone Encounter (Signed)
Lmom, waiting on a return call. Need to discuss possible pt assistance for dexilant.

## 2017-04-09 ENCOUNTER — Telehealth: Payer: Self-pay | Admitting: Internal Medicine

## 2017-04-09 NOTE — Telephone Encounter (Signed)
PATIENT CALLED AND STATED THAT [facility] WILL BE SENDING A PRE AUTH REQUIREMENT OVER FOR HIS MEDICATION

## 2017-04-09 NOTE — Telephone Encounter (Signed)
Lmom, waiting on a return call.  

## 2017-04-12 ENCOUNTER — Telehealth: Payer: Self-pay | Admitting: Internal Medicine

## 2017-04-12 NOTE — Telephone Encounter (Signed)
Pt was calling to speak to nurse about his medication. Please call him back at [phone]

## 2017-04-14 NOTE — Telephone Encounter (Signed)
Closing note. Other documentation will be in that phone note.

## 2017-04-14 NOTE — Telephone Encounter (Signed)
Lmom, waiting on a return call.  

## 2017-04-14 NOTE — Telephone Encounter (Signed)
lmom, documentation is in another note.

## 2017-04-14 NOTE — Telephone Encounter (Signed)
Spoke with pt. Pt has [employer]. Medicaid ID [id number] L, pt has tried prevacid. Working on [employer]

## 2017-04-15 NOTE — Telephone Encounter (Signed)
Lmom, want to offer pt samples until PA is approved or denied.

## 2017-06-01 ENCOUNTER — Encounter: Payer: Self-pay | Admitting: Gastroenterology

## 2017-06-01 ENCOUNTER — Ambulatory Visit: Payer: Medicaid Other | Admitting: Gastroenterology

## 2017-06-01 ENCOUNTER — Telehealth: Payer: Self-pay

## 2017-06-01 ENCOUNTER — Other Ambulatory Visit: Payer: Self-pay

## 2017-06-01 VITALS — BP 125/84 | HR 81 | Temp 97.4°F | Ht 67.0 in | Wt 195.0 lb

## 2017-06-01 DIAGNOSIS — Z86010 Personal history of colonic polyps: Secondary | ICD-10-CM

## 2017-06-01 DIAGNOSIS — R1010 Upper abdominal pain, unspecified: Secondary | ICD-10-CM

## 2017-06-01 DIAGNOSIS — Z860101 Personal history of adenomatous and serrated colon polyps: Secondary | ICD-10-CM | POA: Insufficient documentation

## 2017-06-01 MED ORDER — CLENPIQ 10-3.5-12 MG-GM -GM/160ML PO SOLN: 1.0000 | Freq: Once | ORAL | 0 refills | Status: AC

## 2017-06-01 NOTE — Telephone Encounter (Signed)
Tried to call pt to inform of pre-op appt 07/13/17 at 1:45pm, no answer, LMOVM. Letter mailed.

## 2017-06-01 NOTE — Telephone Encounter (Signed)
Unable to contact anyone at [facility] while pt was in office. LMOVM for [facility] to call pt to schedule HIDA-pt aware.  No PA needed for HIDA per [facility].

## 2017-06-01 NOTE — Patient Instructions (Signed)
Continue Dexilant once daily.  I have ordered a special scan of your gallbladder.  We have scheduled you for a colonoscopy in the near future with Dr. Gala Romney!  We will see you in follow-up after the procedures.  It was a pleasure to see you today. I strive to create trusting relationships with patients to provide genuine, compassionate, and quality care. I value your feedback. If you receive a survey regarding your visit,  I greatly appreciate you taking time to fill this out.   Annitta Needs, PhD, ANP-BC [facility]
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
## 2017-06-02 ENCOUNTER — Telehealth: Payer: Self-pay

## 2017-06-02 NOTE — Telephone Encounter (Signed)
HIDA scheduled for 06/08/17 at 8:00am, arrive at 7:45am. NPO and no pain med after midnight prior to test. Tried to call pt, no answer, LMOVM. Letter mailed.

## 2017-06-02 NOTE — Telephone Encounter (Signed)
Tried to call pt to inform of pre-op appt 07/13/17 at 1:45pm, no answer, LMOVM. Letter mailed.

## 2017-06-08 ENCOUNTER — Encounter (HOSPITAL_COMMUNITY): Payer: Self-pay

## 2017-06-08 ENCOUNTER — Encounter (HOSPITAL_COMMUNITY)
Admission: RE | Admit: 2017-06-08 | Discharge: 2017-06-08 | Disposition: A | Discharge status: Home / Self Care | Payer: Medicaid Other | Source: Ambulatory Visit | Attending: Gastroenterology | Admitting: Gastroenterology

## 2017-06-08 DIAGNOSIS — R1010 Upper abdominal pain, unspecified: Secondary | ICD-10-CM | POA: Diagnosis not present

## 2017-06-08 MED ORDER — TECHNETIUM TC 99M MEBROFENIN IV KIT
5.0000 | PACK | Freq: Once | INTRAVENOUS | Status: AC | PRN
  Administered 2017-06-08: 4.7 via INTRAVENOUS

## 2017-06-08 NOTE — Assessment & Plan Note (Signed)
Chronic abdominal pain at baseline. Known history of esophagitis. EGD on file from 2015. Gallbladder remains in situ, with US abdomen in 2016 without gallstones. CT in Nov 2016 with moderate stool burden. He has had no weight loss and actually is gaining weight. To wrap up GI evaluation, pursue HIDA. Doubt dealing with biliary etiology but unable to exclude. Continue Dexilant and return after colonoscopy.

## 2017-06-08 NOTE — Progress Notes (Signed)
cc'ed to pcp °

## 2017-06-08 NOTE — Assessment & Plan Note (Signed)
47 year old male with history of tubular adenoma in 2014, due for surveillance now. No concerning lower GI symptoms.  Proceed with TCS with Dr. Gala Romney in near future: the risks, benefits, and alternatives have been discussed with the patient in detail. The patient states understanding and desires to proceed. Propofol due to polypharmacy

## 2017-06-16 NOTE — Progress Notes (Signed)
HIDA with normal EF at 94%, no symptoms with Ensure consumption. Will follow-up in office as planned. Keep appt for colonoscopy. Sent in [place]

## 2017-07-09 NOTE — Patient Instructions (Signed)
Benjamin [name]  07/09/2017     @PREFPERIOPPHARMACY @   Your procedure is scheduled on   07/19/2017   Report to [facility] at  81  A.M.  Call this number if you have problems the morning of surgery:  [phone]   Remember:  Do not eat or drink after midnight.  You may drink clear liquids until (follow the instructions given to you) .  Clear liquids allowed are:                    Water, Juice (non-citric and without pulp), Carbonated beverages, Clear Tea, Black Coffee only, Plain Jell-O only, Gatorade and Plain Popsicles only    Take these medicines the morning of surgery with A SIP OF WATER  Celexa, dexilant, hydroxyzine.    Do not wear jewelry, make-up or nail polish.  Do not wear lotions, powders, or perfumes, or deodorant.  Do not shave 48 hours prior to surgery.  Men may shave face and neck.  Do not bring valuables to the hospital.  [facility] is not responsible for any belongings or valuables.  Contacts, dentures or bridgework may not be worn into surgery.  Leave your suitcase in the car.  After surgery it may be brought to your room.  For patients admitted to the hospital, discharge time will be determined by your treatment team.  Patients discharged the day of surgery will not be allowed to drive home.   Name and phone number of your driver:   family Special instructions:  Follow the diet and prep instructions given to you by Dr Roseanne Kaufman office.  Please read over the following fact sheets that you were given. Anesthesia Post-op Instructions and Care and Recovery After Surgery       Colonoscopy, Adult A colonoscopy is an exam to look at the large intestine. It is done to check for problems, such as:  Lumps (tumors).  Growths (polyps).  Swelling (inflammation).  Bleeding.  What happens before the procedure? Eating and drinking Follow instructions from your doctor about eating and drinking. These instructions may include:  A few days before  the procedure - follow a low-fiber diet. ? Avoid nuts. ? Avoid seeds. ? Avoid dried fruit. ? Avoid raw fruits. ? Avoid vegetables.  1-3 days before the procedure - follow a clear liquid diet. Avoid liquids that have red or purple dye. Drink only clear liquids, such as: ? Clear broth or bouillon. ? Black coffee or tea. ? Clear juice. ? Clear soft drinks or sports drinks. ? Gelatin dessert. ? Popsicles.  On the day of the procedure - do not eat or drink anything during the 2 hours before the procedure.  Bowel prep If you were prescribed an oral bowel prep:  Take it as told by your doctor. Starting the day before your procedure, you will need to drink a lot of liquid. The liquid will cause you to poop (have bowel movements) until your poop is almost clear or light green.  If your skin or butt gets irritated from diarrhea, you may: ? Wipe the area with wipes that have medicine in them, such as adult wet wipes with aloe and vitamin E. ? Put something on your skin that soothes the area, such as petroleum jelly.  If you throw up (vomit) while drinking the bowel prep, take a break for up to 60 minutes. Then begin the bowel prep again. If you keep throwing up  and you cannot take the bowel prep without throwing up, call your doctor.  General instructions  Ask your doctor about changing or stopping your normal medicines. This is important if you take diabetes medicines or blood thinners.  Plan to have someone take you home from the hospital or clinic. What happens during the procedure?  An IV tube may be put into one of your veins.  You will be given medicine to help you relax (sedative).  To reduce your risk of infection: ? Your doctors will wash their hands. ? Your anal area will be washed with soap.  You will be asked to lie on your side with your knees bent.  Your doctor will get a long, thin, flexible tube ready. The tube will have a camera and a light on the end.  The tube  will be put into your anus.  The tube will be gently put into your large intestine.  Air will be delivered into your large intestine to keep it open. You may feel some pressure or cramping.  The camera will be used to take photos.  A small tissue sample may be removed from your body to be looked at under a microscope (biopsy). If any possible problems are found, the tissue will be sent to a lab for testing.  If small growths are found, your doctor may remove them and have them checked for cancer.  The tube that was put into your anus will be slowly removed. The procedure may vary among doctors and hospitals. What happens after the procedure?  Your doctor will check on you often until the medicines you were given have worn off.  Do not drive for 24 hours after the procedure.  You may have a small amount of blood in your poop.  You may pass gas.  You may have mild cramps or bloating in your belly (abdomen).  It is up to you to get the results of your procedure. Ask your doctor, or the department performing the procedure, when your results will be ready. This information is not intended to replace advice given to you by your health care provider. Make sure you discuss any questions you have with your health care provider. Document Released: 02/07/2010 Document Revised: 11/06/2015 Document Reviewed: 03/19/2015 Elsevier Interactive Patient Education  2017 [employer]  Colonoscopy, Adult, Care After This sheet gives you information about how to care for yourself after your procedure. Your health care provider may also give you more specific instructions. If you have problems or questions, contact your health care provider. What can I expect after the procedure? After the procedure, it is common to have:  A small amount of blood in your stool for 24 hours after the procedure.  Some gas.  Mild abdominal cramping or bloating.  Follow these instructions at home: General  instructions   For the first 24 hours after the procedure: ? Do not drive or use machinery. ? Do not sign important documents. ? Do not drink alcohol. ? Do your regular daily activities at a slower pace than normal. ? Eat soft, easy-to-digest foods. ? Rest often.  Take over-the-counter or prescription medicines only as told by your health care provider.  It is up to you to get the results of your procedure. Ask your health care provider, or the department performing the procedure, when your results will be ready. Relieving cramping and bloating  Try walking around when you have cramps or feel bloated.  Apply heat to your abdomen as  told by your health care provider. Use a heat source that your health care provider recommends, such as a moist heat pack or a heating pad. ? Place a towel between your skin and the heat source. ? Leave the heat on for 20-30 minutes. ? Remove the heat if your skin turns bright red. This is especially important if you are unable to feel pain, heat, or cold. You may have a greater risk of getting burned. Eating and drinking  Drink enough fluid to keep your urine clear or pale yellow.  Resume your normal diet as instructed by your health care provider. Avoid heavy or fried foods that are hard to digest.  Avoid drinking alcohol for as long as instructed by your health care provider. Contact a health care provider if:  You have blood in your stool 2-3 days after the procedure. Get help right away if:  You have more than a small spotting of blood in your stool.  You pass large blood clots in your stool.  Your abdomen is swollen.  You have nausea or vomiting.  You have a fever.  You have increasing abdominal pain that is not relieved with medicine. This information is not intended to replace advice given to you by your health care provider. Make sure you discuss any questions you have with your health care provider. Document Released: 08/20/2003  Document Revised: 09/30/2015 Document Reviewed: 03/19/2015 Elsevier Interactive Patient Education  2018 [place] Anesthesia is a term that refers to techniques, procedures, and medicines that help a person stay safe and comfortable during a medical procedure. Monitored anesthesia care, or sedation, is one type of anesthesia. Your anesthesia specialist may recommend sedation if you will be having a procedure that does not require you to be unconscious, such as:  Cataract surgery.  A dental procedure.  A biopsy.  A colonoscopy.  During the procedure, you may receive a medicine to help you relax (sedative). There are three levels of sedation:  Mild sedation. At this level, you may feel awake and relaxed. You will be able to follow directions.  Moderate sedation. At this level, you will be sleepy. You may not remember the procedure.  Deep sedation. At this level, you will be asleep. You will not remember the procedure.  The more medicine you are given, the deeper your level of sedation will be. Depending on how you respond to the procedure, the anesthesia specialist may change your level of sedation or the type of anesthesia to fit your needs. An anesthesia specialist will monitor you closely during the procedure. Let your health care provider know about:  Any allergies you have.  All medicines you are taking, including vitamins, herbs, eye drops, creams, and over-the-counter medicines.  Any use of steroids (by mouth or as a cream).  Any problems you or family members have had with sedatives and anesthetic medicines.  Any blood disorders you have.  Any surgeries you have had.  Any medical conditions you have, such as sleep apnea.  Whether you are pregnant or may be pregnant.  Any use of cigarettes, alcohol, or street drugs. What are the risks? Generally, this is a safe procedure. However, problems may occur, including:  Getting too much  medicine (oversedation).  Nausea.  Allergic reaction to medicines.  Trouble breathing. If this happens, a breathing tube may be used to help with breathing. It will be removed when you are awake and breathing on your own.  Heart trouble.  Lung trouble.  Before the procedure Staying hydrated Follow instructions from your health care provider about hydration, which may include:  Up to 2 hours before the procedure - you may continue to drink clear liquids, such as water, clear fruit juice, black coffee, and plain tea.  Eating and drinking restrictions Follow instructions from your health care provider about eating and drinking, which may include:  8 hours before the procedure - stop eating heavy meals or foods such as meat, fried foods, or fatty foods.  6 hours before the procedure - stop eating light meals or foods, such as toast or cereal.  6 hours before the procedure - stop drinking milk or drinks that contain milk.  2 hours before the procedure - stop drinking clear liquids.  Medicines Ask your health care provider about:  Changing or stopping your regular medicines. This is especially important if you are taking diabetes medicines or blood thinners.  Taking medicines such as aspirin and ibuprofen. These medicines can thin your blood. Do not take these medicines before your procedure if your health care provider instructs you not to.  Tests and exams  You will have a physical exam.  You may have blood tests done to show: ? How well your kidneys and liver are working. ? How well your blood can clot.  General instructions  Plan to have someone take you home from the hospital or clinic.  If you will be going home right after the procedure, plan to have someone with you for 24 hours.  What happens during the procedure?  Your blood pressure, heart rate, breathing, level of pain and overall condition will be monitored.  An IV tube will be inserted into one of your  veins.  Your anesthesia specialist will give you medicines as needed to keep you comfortable during the procedure. This may mean changing the level of sedation.  The procedure will be performed. After the procedure  Your blood pressure, heart rate, breathing rate, and blood oxygen level will be monitored until the medicines you were given have worn off.  Do not drive for 24 hours if you received a sedative.  You may: ? Feel sleepy, clumsy, or nauseous. ? Feel forgetful about what happened after the procedure. ? Have a sore throat if you had a breathing tube during the procedure. ? Vomit. This information is not intended to replace advice given to you by your health care provider. Make sure you discuss any questions you have with your health care provider. Document Released: 10/01/2004 Document Revised: 06/14/2015 Document Reviewed: 04/28/2015 Elsevier Interactive Patient Education  2018 [place], Care After These instructions provide you with information about caring for yourself after your procedure. Your health care provider may also give you more specific instructions. Your treatment has been planned according to current medical practices, but problems sometimes occur. Call your health care provider if you have any problems or questions after your procedure. What can I expect after the procedure? After your procedure, it is common to:  Feel sleepy for several hours.  Feel clumsy and have poor balance for several hours.  Feel forgetful about what happened after the procedure.  Have poor judgment for several hours.  Feel nauseous or vomit.  Have a sore throat if you had a breathing tube during the procedure.  Follow these instructions at home: For at least 24 hours after the procedure:   Do not: ? Participate in activities in which you could fall or become injured. ? Drive. ?  Use heavy machinery. ? Drink alcohol. ? Take sleeping pills or  medicines that cause drowsiness. ? Make important decisions or sign legal documents. ? Take care of children on your own.  Rest. Eating and drinking  Follow the diet that is recommended by your health care provider.  If you vomit, drink water, juice, or soup when you can drink without vomiting.  Make sure you have little or no nausea before eating solid foods. General instructions  Have a responsible adult stay with you until you are awake and alert.  Take over-the-counter and prescription medicines only as told by your health care provider.  If you smoke, do not smoke without supervision.  Keep all follow-up visits as told by your health care provider. This is important. Contact a health care provider if:  You keep feeling nauseous or you keep vomiting.  You feel light-headed.  You develop a rash.  You have a fever. Get help right away if:  You have trouble breathing. This information is not intended to replace advice given to you by your health care provider. Make sure you discuss any questions you have with your health care provider. Document Released: 04/28/2015 Document Revised: 08/28/2015 Document Reviewed: 04/28/2015 Elsevier Interactive Patient Education  [employer].

## 2017-07-13 ENCOUNTER — Other Ambulatory Visit: Payer: Self-pay

## 2017-07-13 ENCOUNTER — Telehealth: Payer: Self-pay

## 2017-07-13 ENCOUNTER — Encounter (HOSPITAL_COMMUNITY): Payer: Self-pay

## 2017-07-13 ENCOUNTER — Encounter (HOSPITAL_COMMUNITY)
Admission: RE | Admit: 2017-07-13 | Discharge: 2017-07-13 | Disposition: A | Discharge status: Home / Self Care | Payer: Medicaid Other | Source: Ambulatory Visit | Attending: Internal Medicine | Admitting: Internal Medicine

## 2017-07-13 DIAGNOSIS — Z01818 Encounter for other preprocedural examination: Secondary | ICD-10-CM | POA: Insufficient documentation

## 2017-07-13 DIAGNOSIS — Z01812 Encounter for preprocedural laboratory examination: Secondary | ICD-10-CM | POA: Diagnosis not present

## 2017-07-13 LAB — CBC WITH DIFFERENTIAL/PLATELET
Basophils Absolute: 0 10*3/uL (ref 0.0–0.1)
Basophils Relative: 0 %
EOS PCT: 2 %
Eosinophils Absolute: 0.1 10*3/uL (ref 0.0–0.7)
HCT: 49.1 % (ref 39.0–52.0)
Hemoglobin: 16.7 g/dL (ref 13.0–17.0)
Lymphocytes Relative: 38 %
Lymphs Abs: 2.9 10*3/uL (ref 0.7–4.0)
MCH: 30.4 pg (ref 26.0–34.0)
MCHC: 34 g/dL (ref 30.0–36.0)
MCV: 89.3 fL (ref 78.0–100.0)
Monocytes Absolute: 0.5 10*3/uL (ref 0.1–1.0)
Monocytes Relative: 7 %
Neutro Abs: 4.1 10*3/uL (ref 1.7–7.7)
Neutrophils Relative %: 53 %
PLATELETS: 185 10*3/uL (ref 150–400)
RBC: 5.5 MIL/uL (ref 4.22–5.81)
RDW: 13.3 % (ref 11.5–15.5)
WBC: 7.7 10*3/uL (ref 4.0–10.5)

## 2017-07-13 LAB — BASIC METABOLIC PANEL
Anion gap: 10 (ref 5–15)
BUN: 11 mg/dL (ref 6–20)
CHLORIDE: 109 mmol/L (ref 98–111)
CO2: 24 mmol/L (ref 22–32)
Calcium: 9.2 mg/dL (ref 8.9–10.3)
Creatinine, Ser: 1.1 mg/dL (ref 0.61–1.24)
GFR calc Af Amer: >60 mL/min (ref >60)
Glucose, Bld: 114 mg/dL — ABNORMAL HIGH (ref 70–99)
POTASSIUM: 3.6 mmol/L (ref 3.5–5.1)
Sodium: 143 mmol/L (ref 135–145)

## 2017-07-13 LAB — RAPID URINE DRUG SCREEN, HOSP PERFORMED
Amphetamines: NOT DETECTED
BENZODIAZEPINES: NOT DETECTED
COCAINE: POSITIVE — AB
Opiates: POSITIVE — AB
Tetrahydrocannabinol: NOT DETECTED

## 2017-07-13 NOTE — Telephone Encounter (Addendum)
Benjamin [name] at [facility] called office. Pt is at his pre-op appt. He reported having epigastric pain and burning in esophagus. She wants to know if EGD can be added to TCS w/Propofol scheduled for 07/19/17.

## 2017-07-14 ENCOUNTER — Telehealth: Payer: Self-pay

## 2017-07-14 NOTE — Telephone Encounter (Signed)
Kim at [facility] called office this morning. TCS for 07/19/17 will need to be cancelled d/t pt tested positive for cocaine. Called and informed endo scheduler.  Tried to call pt at both numbers listed in chart, no answer, LMOVM for him to call office.

## 2017-07-14 NOTE — Telephone Encounter (Signed)
Positive for cocaine and narcotics. To my knowledge, he shouldn't be on any narcotics as this was not included in his outpatient med list. I will see him in September and repeat a urine drug screen.

## 2017-07-14 NOTE — Telephone Encounter (Signed)
No EGD indicated. See additional phone notes 6/26.

## 2017-07-14 NOTE — Telephone Encounter (Signed)
Tried to call pt, no answer 

## 2017-07-15 NOTE — Telephone Encounter (Signed)
Called and informed pt procedure has been cancelled.

## 2017-07-15 NOTE — Telephone Encounter (Signed)
Agreed. History of chronic abdominal pain. I have no further recommendations.

## 2017-07-15 NOTE — Telephone Encounter (Signed)
Pt called office back. States he done little bit of cocaine Monday because he was hurting so bad in his stomach. Hadn't done any in 8 months. Michela Pitcher he will be clean if he is rechecked. He knew drug test would be positive when he peed in cup. His pain felt better after doing the cocaine. Advised him procedure has been cancelled and he will be reevaluated when he sees AB in September. He kept saying he wants to have something done. He asked what does he need to do, go to the ER. Advised him if he's hurting so bad then he can go the ER if needed. Told him I would let AB know.

## 2017-07-15 NOTE — Telephone Encounter (Signed)
Tried to call pt to inform him, no answer, mailbox full.

## 2017-07-16 NOTE — Telephone Encounter (Signed)
Tried to call pt, no answer, VM box full. Letter mailed.

## 2017-07-19 ENCOUNTER — Ambulatory Visit (HOSPITAL_COMMUNITY): Admission: RE | Admit: 2017-07-19 | Payer: Medicaid Other | Source: Ambulatory Visit | Admitting: Internal Medicine

## 2017-07-19 ENCOUNTER — Encounter (HOSPITAL_COMMUNITY): Admission: RE | Payer: Self-pay | Source: Ambulatory Visit

## 2017-07-19 SURGERY — COLONOSCOPY WITH PROPOFOL
Anesthesia: Monitor Anesthesia Care

## 2017-08-03 ENCOUNTER — Telehealth: Payer: Self-pay | Admitting: *Deleted

## 2017-08-03 DIAGNOSIS — R109 Unspecified abdominal pain: Secondary | ICD-10-CM

## 2017-08-03 NOTE — Telephone Encounter (Signed)
Patient called in. He reports he is having a lot of throat pain. He was very vague with details. Requested to speak with AB personally. I advised I could help but wanted to speak with AB.

## 2017-08-03 NOTE — Telephone Encounter (Signed)
Please call patient and get some more information please. Thanks!

## 2017-08-03 NOTE — Telephone Encounter (Signed)
Spoke with pt. He hjas felt very tired and feels he can't do much x 3 days. Pt has pain in the center of his chest that is steady, blackish stool with green tint. Pt said he has taken [place] because it helps his chest pain.   Will call pt back [phone]

## 2017-08-03 NOTE — Telephone Encounter (Signed)
Let's pursue updated CT abd/pelvis with contrast due to persistent abdominal pain. His last CT was in 2016. Needs this week.   Pepto will make his stool black.

## 2017-08-03 NOTE — Telephone Encounter (Signed)
Submitted PA info for CT abd/pelvis w/contrast via [employer]. Case pending. Service order: [id number]. Clinical notes faxed to [facility]. Case will need to be approved prior to scheduling CT.

## 2017-08-05 NOTE — Telephone Encounter (Signed)
[place] without any gallstones in 2016. HIDA scan just done May 2019 with normal EF at 94% and no symptoms with Ensure consumption.   His symptoms are not always postprandial. I have very little concern for biliary etiology and feel an ultrasound will not be helpful in this case. Also, an [place] will not show [place] the pancreas well if at all if overlying gas. Insurance needs to know we are more concerned about other etiologies, not biliary. If needed, we can draw a CMP to exclude LFT abnormalities. The only way I would agree with ultrasound first would be if LFT abnormalities.

## 2017-08-05 NOTE — Telephone Encounter (Signed)
CMP ordered per AB. Called and informed pt. He doesn't have a ride today, but he will have blood work done tomorrow.

## 2017-08-05 NOTE — Telephone Encounter (Addendum)
Patient called as he has not heard from [place]. I made him aware AB recommended a CT ABD/PELVIS. [employer] is working on PA as she wants this done this week. He voiced understanding. Patient reports he is in a lot of pain and just wants to know what more he can do. Please advise AB thanks

## 2017-08-05 NOTE — Addendum Note (Signed)
Addended by: Hassan Rowan on: 08/05/2017 02:35 PM   Modules accepted: Orders

## 2017-08-05 NOTE — Telephone Encounter (Signed)
CT abd/pelvis was denied. Recommend [place] first.

## 2017-08-05 NOTE — Telephone Encounter (Signed)
Case pending per [employer] website. 

## 2017-08-07 LAB — COMPREHENSIVE METABOLIC PANEL
AG Ratio: 1.7 (calc) (ref 1.0–2.5)
ALKALINE PHOSPHATASE (APISO): 54 U/L (ref 40–115)
ALT: 45 U/L (ref 9–46)
AST: 34 U/L (ref 10–40)
Albumin: 4.6 g/dL (ref 3.6–5.1)
BUN: 15 mg/dL (ref 7–25)
CALCIUM: 9.6 mg/dL (ref 8.6–10.3)
CO2: 24 mmol/L (ref 20–32)
Chloride: 107 mmol/L (ref 98–110)
Creat: 1.22 mg/dL (ref 0.60–1.35)
GLUCOSE: 88 mg/dL (ref 65–139)
Globulin: 2.7 g/dL (calc) (ref 1.9–3.7)
POTASSIUM: 4.6 mmol/L (ref 3.5–5.3)
Sodium: 141 mmol/L (ref 135–146)
Total Bilirubin: 0.5 mg/dL (ref 0.2–1.2)
Total Protein: 7.3 g/dL (ref 6.1–8.1)

## 2017-08-12 NOTE — Telephone Encounter (Signed)
Called and informed pt.  

## 2017-08-12 NOTE — Telephone Encounter (Signed)
Pt called asking about lab results. He is aware AB is out of office.

## 2017-08-12 NOTE — Telephone Encounter (Signed)
You can let him know his labs were normal.  Will wait for AB regarding trying to get CT approved by insurance.

## 2017-08-17 NOTE — Telephone Encounter (Signed)
LFTs normal. Let's see if we can get CT approved.

## 2017-08-17 NOTE — Telephone Encounter (Signed)
Tried to call pt, no answer, LMOVM and informed of AB's recommendation and PA pending.

## 2017-08-17 NOTE — Telephone Encounter (Signed)
Submitted PA for CT abd/pelvis via [employer]. Case pending. Service order: [id number].

## 2017-08-19 NOTE — Telephone Encounter (Signed)
Tried to call pt at [phone], no answer, LMOVM. Tried to call pt at [phone], no answer, no VM set-up.

## 2017-08-19 NOTE — Telephone Encounter (Signed)
CT abd/pelvis w/contrast approved. PA# [id number]., 08/17/17-09/16/17.

## 2017-08-19 NOTE — Telephone Encounter (Signed)
Tried to call pt, no answer, LMOVM for return call.  

## 2017-08-23 ENCOUNTER — Other Ambulatory Visit: Payer: Self-pay | Admitting: *Deleted

## 2017-08-23 DIAGNOSIS — R109 Unspecified abdominal pain: Secondary | ICD-10-CM

## 2017-08-23 NOTE — Telephone Encounter (Signed)
Pt hasn't returned calls. Letter mailed.  Per AB, CT abd/pelvis w/contast needs to be ordered as "ASAP".

## 2017-08-23 NOTE — Telephone Encounter (Signed)
Patient called back. He is able to go tomorrow for CT to be done.  Called central scheduling. CT scheduled for 08/24/17 at 1:30pm. NPO 4 hrs prior. If patient is unable to go today to pick up oral contrast then he will need to arrive tomorrow at 11:30am to start drinking prep.  Called patient and made aware of appt details. He states he will arrive tomorrow at [facility] at 11:30 to start drinking oral contrast. Nothing further needed

## 2017-08-23 NOTE — Telephone Encounter (Signed)
Tried to call pt, no answer, LMOVM. 

## 2017-08-24 ENCOUNTER — Ambulatory Visit (HOSPITAL_COMMUNITY): Payer: Medicaid Other

## 2017-08-24 ENCOUNTER — Telehealth: Payer: Self-pay | Admitting: Internal Medicine

## 2017-08-24 NOTE — Telephone Encounter (Signed)
Patient called and r/s'd to 8/8 at 2:30pm.

## 2017-08-24 NOTE — Telephone Encounter (Signed)
Pt called wanting to reschedule his CT for today. I gave him the number to call to do that.

## 2017-08-26 ENCOUNTER — Ambulatory Visit (HOSPITAL_COMMUNITY)
Admission: RE | Admit: 2017-08-26 | Discharge: 2017-08-26 | Disposition: A | Discharge status: Home / Self Care | Payer: Medicaid Other | Source: Ambulatory Visit | Attending: Gastroenterology | Admitting: Gastroenterology

## 2017-08-26 DIAGNOSIS — I700 Atherosclerosis of aorta: Secondary | ICD-10-CM | POA: Insufficient documentation

## 2017-08-26 DIAGNOSIS — R109 Unspecified abdominal pain: Secondary | ICD-10-CM | POA: Insufficient documentation

## 2017-08-26 DIAGNOSIS — I70208 Unspecified atherosclerosis of native arteries of extremities, other extremity: Secondary | ICD-10-CM | POA: Diagnosis not present

## 2017-08-26 DIAGNOSIS — K760 Fatty (change of) liver, not elsewhere classified: Secondary | ICD-10-CM | POA: Diagnosis not present

## 2017-08-26 MED ORDER — IOPAMIDOL (ISOVUE-300) INJECTION 61%
100.0000 mL | Freq: Once | INTRAVENOUS | Status: AC | PRN
  Administered 2017-08-26: 100 mL via INTRAVENOUS

## 2017-08-31 NOTE — Progress Notes (Signed)
CT completed. He has an absent left kidney. Mild thickening of urinary bladder. Needs to complete urinalysis with culture reflex. Fatty liver. No acute findings.

## 2017-09-06 ENCOUNTER — Other Ambulatory Visit: Payer: Self-pay

## 2017-09-06 DIAGNOSIS — K760 Fatty (change of) liver, not elsewhere classified: Secondary | ICD-10-CM

## 2017-09-10 LAB — URINALYSIS, ROUTINE W REFLEX MICROSCOPIC
Bilirubin Urine: NEGATIVE
Glucose, UA: NEGATIVE
HGB URINE DIPSTICK: NEGATIVE
KETONES UR: NEGATIVE
LEUKOCYTES UA: NEGATIVE
NITRITE: NEGATIVE
PH: 5.5 (ref 5.0–8.0)
Protein, ur: NEGATIVE
SPECIFIC GRAVITY, URINE: 1.006 (ref 1.001–1.03)

## 2017-09-14 NOTE — Progress Notes (Signed)
UA negative. I will see him in September.

## 2017-09-16 ENCOUNTER — Telehealth: Payer: Self-pay

## 2017-09-16 MED ORDER — SUCRALFATE 1 GM/10ML PO SUSP: 1.0000 g | Freq: Four times a day (QID) | ORAL | 1 refills | Status: DC

## 2017-09-16 NOTE — Telephone Encounter (Signed)
Pt called back today after receiving his urinalysis results yesterday. Pt says he still hurts all the time and knows it's coming from his esophagus. Pt takes Dexilant everyday and is having some sweating in the mornings. No vomiting present. Pt was sweating profusely when having a bowel movement yesterday. When asked was he straining on the toliet, pt says no, it's my esophagus.

## 2017-09-16 NOTE — Addendum Note (Signed)
Addended by: Annitta Needs on: 09/16/2017 12:06 PM   Modules accepted: Orders

## 2017-09-16 NOTE — Telephone Encounter (Signed)
Noted. Pt was notified and is aware of his upcoming apt, procedure that will be scheduled. Pt will add Miralax and start the Carafate.

## 2017-09-16 NOTE — Telephone Encounter (Signed)
I will be seeing him 9/10. He needs to be scheduled for colonoscopy/EGD at that visit.   Biliary work-up unrevealing. CT on file. Continue Dexilant once daily.  Moderate stool noted through colon on CT. May benefit from starting on Miralax once daily to every other day as needed. I am sending in carafate in interim to take QID.

## 2017-09-28 ENCOUNTER — Ambulatory Visit: Payer: Medicaid Other | Admitting: Gastroenterology

## 2017-09-28 ENCOUNTER — Encounter: Payer: Self-pay | Admitting: *Deleted

## 2017-09-28 ENCOUNTER — Other Ambulatory Visit: Payer: Self-pay | Admitting: *Deleted

## 2017-09-28 ENCOUNTER — Telehealth: Payer: Self-pay | Admitting: *Deleted

## 2017-09-28 ENCOUNTER — Encounter: Payer: Self-pay | Admitting: Gastroenterology

## 2017-09-28 VITALS — BP 130/89 | HR 98 | Temp 97.0°F | Ht 67.0 in | Wt 203.0 lb

## 2017-09-28 DIAGNOSIS — Z86010 Personal history of colonic polyps: Secondary | ICD-10-CM

## 2017-09-28 DIAGNOSIS — R1013 Epigastric pain: Secondary | ICD-10-CM

## 2017-09-28 MED ORDER — CLENPIQ 10-3.5-12 MG-GM -GM/160ML PO SOLN: 1.0000 | Freq: Once | ORAL | 0 refills | Status: AC

## 2017-09-28 NOTE — Telephone Encounter (Signed)
Pre-op scheduled for 10/25/17 at 1:45pm. Called pt and LMOVM. Letter mailed
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
## 2017-09-28 NOTE — Patient Instructions (Addendum)
Please have drug screen done today.   We will also do a drug screen during pre-op.  We have arranged a colonoscopy and upper endoscopy in the near future with Dr. Gala Romney!  Please go to your primary care to have the areas on your belly looked at.  I enjoyed seeing you again today! As you know, I value our relationship and want to provide genuine, compassionate, and quality care. I welcome your feedback. If you receive a survey regarding your visit,  I greatly appreciate you taking time to fill this out. See you next time!  Annitta Needs, PhD, ANP-BC [facility]

## 2017-09-29 LAB — DRUGS OF ABUSE SCREEN W/O ALC, ROUTINE URINE
AMPHETAMINES (1000 NG/ML SCRN): NEGATIVE
BARBITURATES: NEGATIVE
BENZODIAZEPINES: NEGATIVE
COCAINE METABOLITES: NEGATIVE
MARIJUANA MET (50 NG/ML SCRN): NEGATIVE
METHADONE: NEGATIVE
METHAQUALONE: NEGATIVE
OPIATES: NEGATIVE
PHENCYCLIDINE: NEGATIVE
PROPOXYPHENE: NEGATIVE

## 2017-09-29 NOTE — Telephone Encounter (Signed)
Patient aware of pre-op appt details °

## 2017-09-29 NOTE — Telephone Encounter (Signed)
LMOVM at both #'s listed in chart

## 2017-10-06 NOTE — Progress Notes (Signed)
CC'D TO PCP °

## 2017-10-06 NOTE — Assessment & Plan Note (Signed)
47 year old male with chronic abdominal pain, s/p [place] in Feb 2016 without stones, HIDA recently with normal EF at 94%, CT Aug 2019 without acute findings. Last EGD in 2015 with erosive esophagitis. GERD not ideally controlled and Dexilant without much help. Prior use of cocaine to help with pain and drug screen positive several months ago; however, it was recently negative. Discussed at length avoidance of illicit substances.  Will pursue EGD although this is likely to be unrevealing.  Proceed with upper endoscopy in the near future with Dr. Gala Romney. The risks, benefits, and alternatives have been discussed in detail with patient. They have stated understanding and desire to proceed.  Propofol due to polypharmacy Continue Dexilant for now Drug screen prior

## 2017-10-06 NOTE — Assessment & Plan Note (Signed)
History of tubular adenoma in 2014 and due for surveillance now. No concerning lower GI symptoms.  Proceed with TCS with Dr. Gala Romney in near future: the risks, benefits, and alternatives have been discussed with the patient in detail. The patient states understanding and desires to proceed. Propofol due to polypharmacy Drug screen prior

## 2017-10-09 ENCOUNTER — Other Ambulatory Visit: Payer: Self-pay | Admitting: Gastroenterology

## 2017-10-20 NOTE — Patient Instructions (Signed)
Benjamin [name]  10/20/2017     @PREFPERIOPPHARMACY @   Your procedure is scheduled on  11/01/2017   Report to [facility] at  60  A.M.  Call this number if you have problems the morning of surgery:  [phone]   Remember:  Do not eat or drink after midnight.  You may drink clear liquids until  (follow the instructions given to you) .  Clear liquids allowed are:                    Water, Juice (non-citric and without pulp), Carbonated beverages, Clear Tea, Black Coffee only, Plain Jell-O only, Gatorade and Plain Popsicles only    Take these medicines the morning of surgery with A SIP OF WATER  Baclofen, buspar, celexa, dexilant, lyrica. Use your inhaler before you come and bring it with you.    Do not wear jewelry, make-up or nail polish.  Do not wear lotions, powders, or perfumes, or deodorant.  Do not shave 48 hours prior to surgery.  Men may shave face and neck.  Do not bring valuables to the hospital.  [facility] is not responsible for any belongings or valuables.  Contacts, dentures or bridgework may not be worn into surgery.  Leave your suitcase in the car.  After surgery it may be brought to your room.  For patients admitted to the hospital, discharge time will be determined by your treatment team.  Patients discharged the day of surgery will not be allowed to drive home.   Name and phone number of your driver:   family Special instructions:  Follow the diet and prep instructions given to you by Dr Roseanne Kaufman office.  Please read over the following fact sheets that you were given. Anesthesia Post-op Instructions and Care and Recovery After Surgery       Colonoscopy, Adult A colonoscopy is an exam to look at the large intestine. It is done to check for problems, such as:  Lumps (tumors).  Growths (polyps).  Swelling (inflammation).  Bleeding.  What happens before the procedure? Eating and drinking Follow instructions from your doctor about eating  and drinking. These instructions may include:  A few days before the procedure - follow a low-fiber diet. ? Avoid nuts. ? Avoid seeds. ? Avoid dried fruit. ? Avoid raw fruits. ? Avoid vegetables.  1-3 days before the procedure - follow a clear liquid diet. Avoid liquids that have red or purple dye. Drink only clear liquids, such as: ? Clear broth or bouillon. ? Black coffee or tea. ? Clear juice. ? Clear soft drinks or sports drinks. ? Gelatin dessert. ? Popsicles.  On the day of the procedure - do not eat or drink anything during the 2 hours before the procedure.  Bowel prep If you were prescribed an oral bowel prep:  Take it as told by your doctor. Starting the day before your procedure, you will need to drink a lot of liquid. The liquid will cause you to poop (have bowel movements) until your poop is almost clear or light green.  If your skin or butt gets irritated from diarrhea, you may: ? Wipe the area with wipes that have medicine in them, such as adult wet wipes with aloe and vitamin E. ? Put something on your skin that soothes the area, such as petroleum jelly.  If you throw up (vomit) while drinking the bowel prep, take a break for up to 60 minutes.  Then begin the bowel prep again. If you keep throwing up and you cannot take the bowel prep without throwing up, call your doctor.  General instructions  Ask your doctor about changing or stopping your normal medicines. This is important if you take diabetes medicines or blood thinners.  Plan to have someone take you home from the hospital or clinic. What happens during the procedure?  An IV tube may be put into one of your veins.  You will be given medicine to help you relax (sedative).  To reduce your risk of infection: ? Your doctors will wash their hands. ? Your anal area will be washed with soap.  You will be asked to lie on your side with your knees bent.  Your doctor will get a long, thin, flexible tube  ready. The tube will have a camera and a light on the end.  The tube will be put into your anus.  The tube will be gently put into your large intestine.  Air will be delivered into your large intestine to keep it open. You may feel some pressure or cramping.  The camera will be used to take photos.  A small tissue sample may be removed from your body to be looked at under a microscope (biopsy). If any possible problems are found, the tissue will be sent to a lab for testing.  If small growths are found, your doctor may remove them and have them checked for cancer.  The tube that was put into your anus will be slowly removed. The procedure may vary among doctors and hospitals. What happens after the procedure?  Your doctor will check on you often until the medicines you were given have worn off.  Do not drive for 24 hours after the procedure.  You may have a small amount of blood in your poop.  You may pass gas.  You may have mild cramps or bloating in your belly (abdomen).  It is up to you to get the results of your procedure. Ask your doctor, or the department performing the procedure, when your results will be ready. This information is not intended to replace advice given to you by your health care provider. Make sure you discuss any questions you have with your health care provider. Document Released: 02/07/2010 Document Revised: 11/06/2015 Document Reviewed: 03/19/2015 Elsevier Interactive Patient Education  2017 [employer]  Colonoscopy, Adult, Care After This sheet gives you information about how to care for yourself after your procedure. Your health care provider may also give you more specific instructions. If you have problems or questions, contact your health care provider. What can I expect after the procedure? After the procedure, it is common to have:  A small amount of blood in your stool for 24 hours after the procedure.  Some gas.  Mild abdominal cramping  or bloating.  Follow these instructions at home: General instructions   For the first 24 hours after the procedure: ? Do not drive or use machinery. ? Do not sign important documents. ? Do not drink alcohol. ? Do your regular daily activities at a slower pace than normal. ? Eat soft, easy-to-digest foods. ? Rest often.  Take over-the-counter or prescription medicines only as told by your health care provider.  It is up to you to get the results of your procedure. Ask your health care provider, or the department performing the procedure, when your results will be ready. Relieving cramping and bloating  Try walking around when you have  cramps or feel bloated.  Apply heat to your abdomen as told by your health care provider. Use a heat source that your health care provider recommends, such as a moist heat pack or a heating pad. ? Place a towel between your skin and the heat source. ? Leave the heat on for 20-30 minutes. ? Remove the heat if your skin turns bright red. This is especially important if you are unable to feel pain, heat, or cold. You may have a greater risk of getting burned. Eating and drinking  Drink enough fluid to keep your urine clear or pale yellow.  Resume your normal diet as instructed by your health care provider. Avoid heavy or fried foods that are hard to digest.  Avoid drinking alcohol for as long as instructed by your health care provider. Contact a health care provider if:  You have blood in your stool 2-3 days after the procedure. Get help right away if:  You have more than a small spotting of blood in your stool.  You pass large blood clots in your stool.  Your abdomen is swollen.  You have nausea or vomiting.  You have a fever.  You have increasing abdominal pain that is not relieved with medicine. This information is not intended to replace advice given to you by your health care provider. Make sure you discuss any questions you have with  your health care provider. Document Released: 08/20/2003 Document Revised: 09/30/2015 Document Reviewed: 03/19/2015 Elsevier Interactive Patient Education  2018 [place] Anesthesia is a term that refers to techniques, procedures, and medicines that help a person stay safe and comfortable during a medical procedure. Monitored anesthesia care, or sedation, is one type of anesthesia. Your anesthesia specialist may recommend sedation if you will be having a procedure that does not require you to be unconscious, such as:  Cataract surgery.  A dental procedure.  A biopsy.  A colonoscopy.  During the procedure, you may receive a medicine to help you relax (sedative). There are three levels of sedation:  Mild sedation. At this level, you may feel awake and relaxed. You will be able to follow directions.  Moderate sedation. At this level, you will be sleepy. You may not remember the procedure.  Deep sedation. At this level, you will be asleep. You will not remember the procedure.  The more medicine you are given, the deeper your level of sedation will be. Depending on how you respond to the procedure, the anesthesia specialist may change your level of sedation or the type of anesthesia to fit your needs. An anesthesia specialist will monitor you closely during the procedure. Let your health care provider know about:  Any allergies you have.  All medicines you are taking, including vitamins, herbs, eye drops, creams, and over-the-counter medicines.  Any use of steroids (by mouth or as a cream).  Any problems you or family members have had with sedatives and anesthetic medicines.  Any blood disorders you have.  Any surgeries you have had.  Any medical conditions you have, such as sleep apnea.  Whether you are pregnant or may be pregnant.  Any use of cigarettes, alcohol, or street drugs. What are the risks? Generally, this is a safe procedure. However,  problems may occur, including:  Getting too much medicine (oversedation).  Nausea.  Allergic reaction to medicines.  Trouble breathing. If this happens, a breathing tube may be used to help with breathing. It will be removed when you are awake  and breathing on your own.  Heart trouble.  Lung trouble.  Before the procedure Staying hydrated Follow instructions from your health care provider about hydration, which may include:  Up to 2 hours before the procedure - you may continue to drink clear liquids, such as water, clear fruit juice, black coffee, and plain tea.  Eating and drinking restrictions Follow instructions from your health care provider about eating and drinking, which may include:  8 hours before the procedure - stop eating heavy meals or foods such as meat, fried foods, or fatty foods.  6 hours before the procedure - stop eating light meals or foods, such as toast or cereal.  6 hours before the procedure - stop drinking milk or drinks that contain milk.  2 hours before the procedure - stop drinking clear liquids.  Medicines Ask your health care provider about:  Changing or stopping your regular medicines. This is especially important if you are taking diabetes medicines or blood thinners.  Taking medicines such as aspirin and ibuprofen. These medicines can thin your blood. Do not take these medicines before your procedure if your health care provider instructs you not to.  Tests and exams  You will have a physical exam.  You may have blood tests done to show: ? How well your kidneys and liver are working. ? How well your blood can clot.  General instructions  Plan to have someone take you home from the hospital or clinic.  If you will be going home right after the procedure, plan to have someone with you for 24 hours.  What happens during the procedure?  Your blood pressure, heart rate, breathing, level of pain and overall condition will be  monitored.  An IV tube will be inserted into one of your veins.  Your anesthesia specialist will give you medicines as needed to keep you comfortable during the procedure. This may mean changing the level of sedation.  The procedure will be performed. After the procedure  Your blood pressure, heart rate, breathing rate, and blood oxygen level will be monitored until the medicines you were given have worn off.  Do not drive for 24 hours if you received a sedative.  You may: ? Feel sleepy, clumsy, or nauseous. ? Feel forgetful about what happened after the procedure. ? Have a sore throat if you had a breathing tube during the procedure. ? Vomit. This information is not intended to replace advice given to you by your health care provider. Make sure you discuss any questions you have with your health care provider. Document Released: 10/01/2004 Document Revised: 06/14/2015 Document Reviewed: 04/28/2015 Elsevier Interactive Patient Education  2018 [place], Care After These instructions provide you with information about caring for yourself after your procedure. Your health care provider may also give you more specific instructions. Your treatment has been planned according to current medical practices, but problems sometimes occur. Call your health care provider if you have any problems or questions after your procedure. What can I expect after the procedure? After your procedure, it is common to:  Feel sleepy for several hours.  Feel clumsy and have poor balance for several hours.  Feel forgetful about what happened after the procedure.  Have poor judgment for several hours.  Feel nauseous or vomit.  Have a sore throat if you had a breathing tube during the procedure.  Follow these instructions at home: For at least 24 hours after the procedure:   Do not: ? Participate in  activities in which you could fall or become injured. ? Drive. ? Use  heavy machinery. ? Drink alcohol. ? Take sleeping pills or medicines that cause drowsiness. ? Make important decisions or sign legal documents. ? Take care of children on your own.  Rest. Eating and drinking  Follow the diet that is recommended by your health care provider.  If you vomit, drink water, juice, or soup when you can drink without vomiting.  Make sure you have little or no nausea before eating solid foods. General instructions  Have a responsible adult stay with you until you are awake and alert.  Take over-the-counter and prescription medicines only as told by your health care provider.  If you smoke, do not smoke without supervision.  Keep all follow-up visits as told by your health care provider. This is important. Contact a health care provider if:  You keep feeling nauseous or you keep vomiting.  You feel light-headed.  You develop a rash.  You have a fever. Get help right away if:  You have trouble breathing. This information is not intended to replace advice given to you by your health care provider. Make sure you discuss any questions you have with your health care provider. Document Released: 04/28/2015 Document Revised: 08/28/2015 Document Reviewed: 04/28/2015 Elsevier Interactive Patient Education  [employer].

## 2017-10-25 ENCOUNTER — Other Ambulatory Visit: Payer: Self-pay

## 2017-10-25 ENCOUNTER — Encounter (HOSPITAL_COMMUNITY): Payer: Self-pay

## 2017-10-25 ENCOUNTER — Encounter (HOSPITAL_COMMUNITY)
Admission: RE | Admit: 2017-10-25 | Discharge: 2017-10-25 | Disposition: A | Discharge status: Home / Self Care | Payer: Medicaid Other | Source: Ambulatory Visit | Attending: Internal Medicine | Admitting: Internal Medicine

## 2017-10-25 DIAGNOSIS — R1013 Epigastric pain: Secondary | ICD-10-CM | POA: Diagnosis not present

## 2017-10-25 DIAGNOSIS — Z01818 Encounter for other preprocedural examination: Secondary | ICD-10-CM | POA: Diagnosis present

## 2017-10-25 DIAGNOSIS — Z86010 Personal history of colonic polyps: Secondary | ICD-10-CM | POA: Diagnosis not present

## 2017-10-25 HISTORY — DX: Spinal stenosis, site unspecified: M48.00

## 2017-10-25 LAB — CBC
HEMATOCRIT: 49.3 % (ref 39.0–52.0)
Hemoglobin: 16.4 g/dL (ref 13.0–17.0)
MCH: 29.8 pg (ref 26.0–34.0)
MCHC: 33.3 g/dL (ref 30.0–36.0)
MCV: 89.6 fL (ref 78.0–100.0)
Platelets: 184 10*3/uL (ref 150–400)
RBC: 5.5 MIL/uL (ref 4.22–5.81)
RDW: 13.2 % (ref 11.5–15.5)
WBC: 7.4 10*3/uL (ref 4.0–10.5)

## 2017-10-25 LAB — BASIC METABOLIC PANEL
Anion gap: 10 (ref 5–15)
BUN: 11 mg/dL (ref 6–20)
CHLORIDE: 104 mmol/L (ref 98–111)
CO2: 21 mmol/L — AB (ref 22–32)
CREATININE: 1.18 mg/dL (ref 0.61–1.24)
Calcium: 8.7 mg/dL — ABNORMAL LOW (ref 8.9–10.3)
GFR calc Af Amer: >60 mL/min (ref >60)
GFR calc non Af Amer: >60 mL/min (ref >60)
Glucose, Bld: 124 mg/dL — ABNORMAL HIGH (ref 70–99)
POTASSIUM: 3.3 mmol/L — AB (ref 3.5–5.1)
SODIUM: 135 mmol/L (ref 135–145)

## 2017-10-25 LAB — RAPID URINE DRUG SCREEN, HOSP PERFORMED
Amphetamines: NOT DETECTED
Barbiturates: NOT DETECTED
Benzodiazepines: NOT DETECTED
COCAINE: NOT DETECTED
OPIATES: NOT DETECTED
TETRAHYDROCANNABINOL: NOT DETECTED

## 2017-11-01 ENCOUNTER — Encounter (HOSPITAL_COMMUNITY): Payer: Self-pay | Admitting: *Deleted

## 2017-11-01 ENCOUNTER — Other Ambulatory Visit: Payer: Self-pay

## 2017-11-01 ENCOUNTER — Encounter (HOSPITAL_COMMUNITY)
Admission: RE | Disposition: A | Discharge status: Home / Self Care | Payer: Self-pay | Source: Ambulatory Visit | Attending: Internal Medicine

## 2017-11-01 ENCOUNTER — Ambulatory Visit (HOSPITAL_COMMUNITY): Payer: Medicaid Other | Admitting: Anesthesiology

## 2017-11-01 ENCOUNTER — Ambulatory Visit (HOSPITAL_COMMUNITY)
Admission: RE | Admit: 2017-11-01 | Discharge: 2017-11-01 | Disposition: A | Discharge status: Home / Self Care | Payer: Medicaid Other | Source: Ambulatory Visit | Attending: Internal Medicine | Admitting: Internal Medicine

## 2017-11-01 DIAGNOSIS — K635 Polyp of colon: Secondary | ICD-10-CM | POA: Insufficient documentation

## 2017-11-01 DIAGNOSIS — K449 Diaphragmatic hernia without obstruction or gangrene: Secondary | ICD-10-CM | POA: Diagnosis not present

## 2017-11-01 DIAGNOSIS — Z8719 Personal history of other diseases of the digestive system: Secondary | ICD-10-CM | POA: Diagnosis not present

## 2017-11-01 DIAGNOSIS — F17210 Nicotine dependence, cigarettes, uncomplicated: Secondary | ICD-10-CM | POA: Insufficient documentation

## 2017-11-01 DIAGNOSIS — R1310 Dysphagia, unspecified: Secondary | ICD-10-CM

## 2017-11-01 DIAGNOSIS — K219 Gastro-esophageal reflux disease without esophagitis: Secondary | ICD-10-CM | POA: Insufficient documentation

## 2017-11-01 DIAGNOSIS — Z1211 Encounter for screening for malignant neoplasm of colon: Secondary | ICD-10-CM | POA: Insufficient documentation

## 2017-11-01 DIAGNOSIS — F329 Major depressive disorder, single episode, unspecified: Secondary | ICD-10-CM | POA: Diagnosis not present

## 2017-11-01 DIAGNOSIS — K589 Irritable bowel syndrome without diarrhea: Secondary | ICD-10-CM | POA: Insufficient documentation

## 2017-11-01 DIAGNOSIS — Z79899 Other long term (current) drug therapy: Secondary | ICD-10-CM | POA: Insufficient documentation

## 2017-11-01 DIAGNOSIS — F419 Anxiety disorder, unspecified: Secondary | ICD-10-CM | POA: Insufficient documentation

## 2017-11-01 DIAGNOSIS — Z86010 Personal history of colonic polyps: Secondary | ICD-10-CM | POA: Insufficient documentation

## 2017-11-01 HISTORY — PX: POLYPECTOMY: SHX5525

## 2017-11-01 HISTORY — PX: ESOPHAGOGASTRODUODENOSCOPY (EGD) WITH PROPOFOL: SHX5813

## 2017-11-01 HISTORY — PX: COLONOSCOPY WITH PROPOFOL: SHX5780

## 2017-11-01 SURGERY — COLONOSCOPY WITH PROPOFOL
Anesthesia: Monitor Anesthesia Care

## 2017-11-01 MED ORDER — PROPOFOL 10 MG/ML IV BOLUS
INTRAVENOUS | Status: AC
  Filled 2017-11-01: qty 60

## 2017-11-01 MED ORDER — LIDOCAINE HCL (CARDIAC) PF 50 MG/5ML IV SOSY
PREFILLED_SYRINGE | INTRAVENOUS | Status: DC | PRN
  Administered 2017-11-01: 30 mg via INTRAVENOUS

## 2017-11-01 MED ORDER — PROPOFOL 500 MG/50ML IV EMUL
INTRAVENOUS | Status: DC | PRN
  Administered 2017-11-01: 150 ug/kg/min via INTRAVENOUS

## 2017-11-01 MED ORDER — LACTATED RINGERS IV SOLN
INTRAVENOUS | Status: DC | PRN
  Administered 2017-11-01: 10:00:00 via INTRAVENOUS

## 2017-11-01 MED ORDER — LIDOCAINE HCL (PF) 1 % IJ SOLN
INTRAMUSCULAR | Status: AC
  Filled 2017-11-01: qty 5

## 2017-11-01 MED ORDER — LACTATED RINGERS IV SOLN: INTRAVENOUS | Status: DC

## 2017-11-01 MED ORDER — PROPOFOL 10 MG/ML IV BOLUS
INTRAVENOUS | Status: DC | PRN
  Administered 2017-11-01 (×2): 30 mg via INTRAVENOUS

## 2017-11-01 MED ORDER — GLYCOPYRROLATE 0.2 MG/ML IJ SOLN
INTRAMUSCULAR | Status: AC
  Filled 2017-11-01: qty 1

## 2017-11-01 MED ORDER — GLYCOPYRROLATE 0.2 MG/ML IJ SOLN
INTRAMUSCULAR | Status: DC | PRN
  Administered 2017-11-01: 0.2 mg via INTRAVENOUS

## 2017-11-01 NOTE — Op Note (Signed)
[facility] Patient Name: Benjamin [name] Procedure Date: 11/01/2017 9:35 AM MRN: [id number] Date of Birth: 16-Jul-1970 Attending MD: Norvel Richards , MD CSN: [id number] Age: 47 Admit Type: Outpatient Procedure:                Colonoscopy Indications:              High risk colon cancer surveillance: Personal                            history of colonic polyps Providers:                Norvel Richards, MD, Otis Peak B. Sharon Seller, RN,                            Aram Candela Referring MD:              Medicines:                Propofol per Anesthesia Complications:            No immediate complications. Estimated Blood Loss:     Estimated blood loss was minimal. Procedure:                Pre-Anesthesia Assessment:                           - Prior to the procedure, a History and Physical                            was performed, and patient medications and                            allergies were reviewed. The patient's tolerance of                            previous anesthesia was also reviewed. The risks                            and benefits of the procedure and the sedation                            options and risks were discussed with the patient.                            All questions were answered, and informed consent                            was obtained. Prior Anticoagulants: The patient has                            taken no previous anticoagulant or antiplatelet                            agents. ASA Grade Assessment: II - A patient with  mild systemic disease. After reviewing the risks                            and benefits, the patient was deemed in                            satisfactory condition to undergo the procedure.                           After obtaining informed consent, the colonoscope                            was passed under direct vision. Throughout the                            procedure, the patient's  blood pressure, pulse, and                            oxygen saturations were monitored continuously. The                            CF-HQ190L ([id number]) scope was introduced through                            the anus and advanced to the the cecum, identified                            by appendiceal orifice and ileocecal valve. The                            colonoscopy was performed without difficulty. The                            patient tolerated the procedure well. The quality                            of the bowel preparation was adequate. The                            ileocecal valve, appendiceal orifice, and rectum                            were photographed. The entire colon was well                            visualized. Scope In: 10:03:47 AM Scope Out: 10:16:35 AM Scope Withdrawal Time: 0 hours 10 minutes 2 seconds  Total Procedure Duration: 0 hours 12 minutes 48 seconds  Findings:      The perianal and digital rectal examinations were normal.      Two sessile polyps were found in the sigmoid colon and ileocecal valve.       The polyps were 4 to 6 mm in size. These polyps were removed with a cold       snare. Resection and retrieval were complete.  Estimated blood loss was       minimal.      The exam was otherwise without abnormality on direct and retroflexion       views. Impression:               - Two 4 to 6 mm polyps in the sigmoid colon and at                            the ileocecal valve, removed with a cold snare.                            Resected and retrieved.                           - The examination was otherwise normal on direct                            and retroflexion views. Moderate Sedation:      Moderate (conscious) sedation was personally administered by an       anesthesia professional. The following parameters were monitored: oxygen       saturation, heart rate, blood pressure, respiratory rate, EKG, adequacy       of pulmonary ventilation,  and response to care. Recommendation:           - Patient has a contact number available for                            emergencies. The signs and symptoms of potential                            delayed complications were discussed with the                            patient. Return to normal activities tomorrow.                            Written discharge instructions were provided to the                            patient.                           - Resume previous diet.                           - Continue present medications.                           - Return to GI office after studies are complete.                            See EGD report Procedure Code(s):        --- Professional ---                           [phone], Colonoscopy, flexible; with removal of  tumor(s), polyp(s), or other lesion(s) by snare                            technique Diagnosis Code(s):        --- Professional ---                           Z86.010, Personal history of colonic polyps                           D12.5, Benign neoplasm of sigmoid colon                           D12.0, Benign neoplasm of cecum CPT copyright 2018 American Medical Association. All rights reserved. The codes documented in this report are preliminary and upon coder review may  be revised to meet current compliance requirements. Cristopher Estimable. Micole Delehanty, MD Norvel Richards, MD 11/01/2017 10:20:58 AM This report has been signed electronically. Number of Addenda: 0

## 2017-11-01 NOTE — Anesthesia Preprocedure Evaluation (Signed)
Anesthesia Evaluation  Patient identified by MRN, date of birth, ID band Patient awake    Reviewed: Allergy & Precautions, H&P , NPO status , Patient's Chart, lab work & pertinent test results, reviewed documented beta blocker date and time   Airway Mallampati: II  TM Distance: >3 FB Neck ROM: full    Dental  (+) Missing   Pulmonary neg pulmonary ROS, Current Smoker,    Pulmonary exam normal breath sounds clear to auscultation       Cardiovascular Exercise Tolerance: Good negative cardio ROS   Rhythm:regular Rate:Normal     Neuro/Psych  Headaches,  Neuromuscular disease negative psych ROS   GI/Hepatic Neg liver ROS, PUD, GERD  ,  Endo/Other  negative endocrine ROS  Renal/GU Renal disease  negative genitourinary   Musculoskeletal   Abdominal   Peds  Hematology negative hematology ROS (+)   Anesthesia Other Findings   Reproductive/Obstetrics negative OB ROS                             Anesthesia Physical Anesthesia Plan  ASA: II  Anesthesia Plan: MAC   Post-op Pain Management:    Induction:   PONV Risk Score and Plan:   Airway Management Planned:   Additional Equipment:   Intra-op Plan:   Post-operative Plan:   Informed Consent: I have reviewed the patients History and Physical, chart, labs and discussed the procedure including the risks, benefits and alternatives for the proposed anesthesia with the patient or authorized representative who has indicated his/her understanding and acceptance.   Dental Advisory Given  Plan Discussed with: CRNA  Anesthesia Plan Comments:         Anesthesia Quick Evaluation

## 2017-11-01 NOTE — Discharge Instructions (Signed)
Colonoscopy Discharge Instructions  Read the instructions outlined below and refer to this sheet in the next few weeks. These discharge instructions provide you with general information on caring for yourself after you leave the hospital. Your doctor may also give you specific instructions. While your treatment has been planned according to the most current medical practices available, unavoidable complications occasionally occur. If you have any problems or questions after discharge, call Dr. Gala Romney at [phone]. ACTIVITY  You may resume your regular activity, but move at a slower pace for the next 24 hours.   Take frequent rest periods for the next 24 hours.   Walking will help get rid of the air and reduce the bloated feeling in your belly (abdomen).   No driving for 24 hours (because of the medicine (anesthesia) used during the test).    Do not sign any important legal documents or operate any machinery for 24 hours (because of the anesthesia used during the test).  NUTRITION  Drink plenty of fluids.   You may resume your normal diet as instructed by your doctor.   Begin with a light meal and progress to your normal diet. Heavy or fried foods are harder to digest and may make you feel sick to your stomach (nauseated).   Avoid alcoholic beverages for 24 hours or as instructed.  MEDICATIONS  You may resume your normal medications unless your doctor tells you otherwise.  WHAT YOU CAN EXPECT TODAY  Some feelings of bloating in the abdomen.   Passage of more gas than usual.   Spotting of blood in your stool or on the toilet paper.  IF YOU HAD POLYPS REMOVED DURING THE COLONOSCOPY:  No aspirin products for 7 days or as instructed.   No alcohol for 7 days or as instructed.   Eat a soft diet for the next 24 hours.  FINDING OUT THE RESULTS OF YOUR TEST Not all test results are available during your visit. If your test results are not back during the visit, make an appointment  with your caregiver to find out the results. Do not assume everything is normal if you have not heard from your caregiver or the medical facility. It is important for you to follow up on all of your test results.  SEEK IMMEDIATE MEDICAL ATTENTION IF:  You have more than a spotting of blood in your stool.   Your belly is swollen (abdominal distention).   You are nauseated or vomiting.   You have a temperature over 101.   You have abdominal pain or discomfort that is severe or gets worse throughout the day.   EGD Discharge instructions Please read the instructions outlined below and refer to this sheet in the next few weeks. These discharge instructions provide you with general information on caring for yourself after you leave the hospital. Your doctor may also give you specific instructions. While your treatment has been planned according to the most current medical practices available, unavoidable complications occasionally occur. If you have any problems or questions after discharge, please call your doctor. ACTIVITY  You may resume your regular activity but move at a slower pace for the next 24 hours.   Take frequent rest periods for the next 24 hours.   Walking will help expel (get rid of) the air and reduce the bloated feeling in your abdomen.   No driving for 24 hours (because of the anesthesia (medicine) used during the test).   You may shower.   Do not sign any  important legal documents or operate any machinery for 24 hours (because of the anesthesia used during the test).  NUTRITION  Drink plenty of fluids.   You may resume your normal diet.   Begin with a light meal and progress to your normal diet.   Avoid alcoholic beverages for 24 hours or as instructed by your caregiver.  MEDICATIONS  You may resume your normal medications unless your caregiver tells you otherwise.  WHAT YOU CAN EXPECT TODAY  You may experience abdominal discomfort such as a feeling of  fullness or gas pains.  FOLLOW-UP  Your doctor will discuss the results of your test with you.  SEEK IMMEDIATE MEDICAL ATTENTION IF ANY OF THE FOLLOWING OCCUR:  Excessive nausea (feeling sick to your stomach) and/or vomiting.   Severe abdominal pain and distention (swelling).   Trouble swallowing.   Temperature over 101 F (37.8 C).   Rectal bleeding or vomiting of blood.    Continue Carafate and Dexilant  Colon polyp information provided  Further recommendations to follow pending review of pathology report  Lets recheck gallbladder with ultrasound (it has been 5 years since last ultrasound and it is better than a CT for checking the gallbladder.  Office visit with [place] in 6 weeks
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
## 2017-11-01 NOTE — Anesthesia Procedure Notes (Signed)
Procedure Name: MAC Date/Time: 11/01/2017 9:48 AM Performed by: Andree Elk Amy A, CRNA Pre-anesthesia Checklist: Patient identified, Emergency Drugs available, Suction available, Patient being monitored and Timeout performed Oxygen Delivery Method: Simple face mask

## 2017-11-01 NOTE — Anesthesia Postprocedure Evaluation (Signed)
Anesthesia Post Note  Patient: Benjamin [name]  Procedure(s) Performed: COLONOSCOPY WITH PROPOFOL (N/A ) ESOPHAGOGASTRODUODENOSCOPY (EGD) WITH PROPOFOL (N/A ) POLYPECTOMY  Patient location during evaluation: PACU Anesthesia Type: MAC Level of consciousness: awake and alert and oriented Pain management: pain level controlled Vital Signs Assessment: post-procedure vital signs reviewed and stable Respiratory status: spontaneous breathing Cardiovascular status: stable Postop Assessment: no apparent nausea or vomiting Anesthetic complications: no     Last Vitals:  Vitals:   11/01/17 0934  BP: (!) 144/102  Pulse: 70  Resp: 16  Temp: 36.4 C  SpO2: 96%    Last Pain:  Vitals:   11/01/17 0950  TempSrc:   PainSc: 0-No pain                 [name]

## 2017-11-01 NOTE — Op Note (Signed)
[facility] Patient Name: Benjamin [name] Procedure Date: 11/01/2017 9:39 AM MRN: [id number] Date of Birth: 25-Jan-1970 Attending MD: Norvel Richards , MD CSN: [id number] Age: 47 Admit Type: Outpatient Procedure:                Upper GI endoscopy Indications:              Dyspepsia Providers:                Norvel Richards, MD, Jeanann Lewandowsky. Sharon Seller, RN,                            Aram Candela Referring MD:             Antony Contras Medicines:                Propofol per Anesthesia Complications:            No immediate complications. Estimated Blood Loss:      Procedure:                Pre-Anesthesia Assessment:                           - Prior to the procedure, a History and Physical                            was performed, and patient medications and                            allergies were reviewed. The patient's tolerance of                            previous anesthesia was also reviewed. The risks                            and benefits of the procedure and the sedation                            options and risks were discussed with the patient.                            All questions were answered, and informed consent                            was obtained. Prior Anticoagulants: The patient has                            taken no previous anticoagulant or antiplatelet                            agents. ASA Grade Assessment: II - A patient with                            mild systemic disease. After reviewing the risks  and benefits, the patient was deemed in                            satisfactory condition to undergo the procedure.                           After obtaining informed consent, the endoscope was                            passed under direct vision. Throughout the                            procedure, the patient's blood pressure, pulse, and                            oxygen saturations were monitored continuously. The                             [id number]-[id number] ([id number]) scope was introduced through the                            and advanced to the second part of duodenum. The                            upper GI endoscopy was accomplished without                            difficulty. The patient tolerated the procedure                            well. Scope In: 4:94:49 AM Scope Out: 9:59:36 AM Total Procedure Duration: 0 hours 3 minutes 17 seconds  Findings:      The examined esophagus was normal.      A small hiatal hernia was present.      The exam was otherwise without abnormality.      The duodenal bulb and second portion of the duodenum were normal. Impression:               - Normal esophagus.                           - Small hiatal hernia.                           - The examination was otherwise normal.                           - Normal duodenal bulb and second portion of the                            duodenum.                           - No specimens collected. Moderate Sedation:      Moderate (conscious) sedation was personally administered by an       anesthesia  professional. The following parameters were monitored: oxygen       saturation, heart rate, blood pressure, respiratory rate, EKG, adequacy       of pulmonary ventilation, and response to care. Recommendation:           - Patient has a contact number available for                            emergencies. The signs and symptoms of potential                            delayed complications were discussed with the                            patient. Return to normal activities tomorrow.                            Written discharge instructions were provided to the                            patient.                           - Resume previous diet.                           - Continue present medications. Continue Dexilant                            60 mg daily. Add Carafate 1 g suspension before                            meals and  at bedtime as needed                           - No repeat upper endoscopy.                           - Return to GI office (date not yet determined).                            See colonoscopy report. Procedure Code(s):        --- Professional ---                           [phone], Esophagogastroduodenoscopy, flexible,                            transoral; diagnostic, including collection of                            specimen(s) by brushing or washing, when performed                            (separate procedure) Diagnosis Code(s):        --- Professional ---  K44.9, Diaphragmatic hernia without obstruction or                            gangrene                           R10.13, Epigastric pain CPT copyright 2018 American Medical Association. All rights reserved. The codes documented in this report are preliminary and upon coder review may  be revised to meet current compliance requirements. Cristopher Estimable. Ho Parisi, MD Norvel Richards, MD 11/01/2017 10:17:58 AM This report has been signed electronically. Number of Addenda: 0

## 2017-11-01 NOTE — Transfer of Care (Signed)
Immediate Anesthesia Transfer of Care Note  Patient: Benjamin [name]  Procedure(s) Performed: COLONOSCOPY WITH PROPOFOL (N/A ) ESOPHAGOGASTRODUODENOSCOPY (EGD) WITH PROPOFOL (N/A ) POLYPECTOMY  Patient Location: PACU  Anesthesia Type:MAC  Level of Consciousness: awake, alert , oriented and patient cooperative  Airway & Oxygen Therapy: Patient Spontanous Breathing  Post-op Assessment: Report given to RN and Post -op Vital signs reviewed and stable  Post vital signs: Reviewed and stable  Last Vitals:  Vitals Value Taken Time  BP 142/114 11/01/2017 10:21 AM  Temp    Pulse 90 11/01/2017 10:23 AM  Resp 17 11/01/2017 10:23 AM  SpO2 100 % 11/01/2017 10:23 AM  Vitals shown include unvalidated device data.  Last Pain:  Vitals:   11/01/17 0950  TempSrc:   PainSc: 0-No pain      Patients Stated Pain Goal: 8 (11/18/11 1438)  Complications: No apparent anesthesia complications

## 2017-11-08 ENCOUNTER — Encounter (HOSPITAL_COMMUNITY): Payer: Self-pay | Admitting: Internal Medicine

## 2017-11-09 ENCOUNTER — Encounter: Payer: Self-pay | Admitting: Internal Medicine

## 2017-11-22 ENCOUNTER — Other Ambulatory Visit: Payer: Self-pay | Admitting: Gastroenterology

## 2018-01-17 ENCOUNTER — Encounter (HOSPITAL_COMMUNITY): Payer: Self-pay

## 2018-01-17 ENCOUNTER — Emergency Department (HOSPITAL_COMMUNITY): Payer: Medicaid Other

## 2018-01-17 ENCOUNTER — Other Ambulatory Visit: Payer: Self-pay

## 2018-01-17 ENCOUNTER — Inpatient Hospital Stay (HOSPITAL_COMMUNITY)
Admission: EM | Admit: 2018-01-17 | Discharge: 2018-02-02 | DRG: 917 | Disposition: A | Discharge status: Other Acute Inpatient Hospital | Payer: Medicaid Other | Attending: Internal Medicine | Admitting: Internal Medicine

## 2018-01-17 DIAGNOSIS — G9341 Metabolic encephalopathy: Secondary | ICD-10-CM

## 2018-01-17 DIAGNOSIS — E872 Acidosis: Secondary | ICD-10-CM | POA: Diagnosis present

## 2018-01-17 DIAGNOSIS — F332 Major depressive disorder, recurrent severe without psychotic features: Secondary | ICD-10-CM | POA: Diagnosis not present

## 2018-01-17 DIAGNOSIS — Z4659 Encounter for fitting and adjustment of other gastrointestinal appliance and device: Secondary | ICD-10-CM

## 2018-01-17 DIAGNOSIS — F1410 Cocaine abuse, uncomplicated: Secondary | ICD-10-CM | POA: Diagnosis present

## 2018-01-17 DIAGNOSIS — G40401 Other generalized epilepsy and epileptic syndromes, not intractable, with status epilepticus: Secondary | ICD-10-CM | POA: Diagnosis not present

## 2018-01-17 DIAGNOSIS — F329 Major depressive disorder, single episode, unspecified: Secondary | ICD-10-CM | POA: Diagnosis not present

## 2018-01-17 DIAGNOSIS — R112 Nausea with vomiting, unspecified: Secondary | ICD-10-CM

## 2018-01-17 DIAGNOSIS — R0689 Other abnormalities of breathing: Secondary | ICD-10-CM

## 2018-01-17 DIAGNOSIS — R1310 Dysphagia, unspecified: Secondary | ICD-10-CM | POA: Diagnosis present

## 2018-01-17 DIAGNOSIS — T402X1A Poisoning by other opioids, accidental (unintentional), initial encounter: Secondary | ICD-10-CM | POA: Diagnosis present

## 2018-01-17 DIAGNOSIS — Z8711 Personal history of peptic ulcer disease: Secondary | ICD-10-CM

## 2018-01-17 DIAGNOSIS — F1210 Cannabis abuse, uncomplicated: Secondary | ICD-10-CM | POA: Diagnosis not present

## 2018-01-17 DIAGNOSIS — F322 Major depressive disorder, single episode, severe without psychotic features: Secondary | ICD-10-CM | POA: Diagnosis not present

## 2018-01-17 DIAGNOSIS — A419 Sepsis, unspecified organism: Secondary | ICD-10-CM | POA: Diagnosis not present

## 2018-01-17 DIAGNOSIS — R6521 Severe sepsis with septic shock: Secondary | ICD-10-CM | POA: Diagnosis not present

## 2018-01-17 DIAGNOSIS — R05 Cough: Secondary | ICD-10-CM

## 2018-01-17 DIAGNOSIS — E870 Hyperosmolality and hypernatremia: Secondary | ICD-10-CM | POA: Diagnosis not present

## 2018-01-17 DIAGNOSIS — Z79899 Other long term (current) drug therapy: Secondary | ICD-10-CM | POA: Diagnosis not present

## 2018-01-17 DIAGNOSIS — F10239 Alcohol dependence with withdrawal, unspecified: Secondary | ICD-10-CM | POA: Diagnosis not present

## 2018-01-17 DIAGNOSIS — I10 Essential (primary) hypertension: Secondary | ICD-10-CM | POA: Diagnosis not present

## 2018-01-17 DIAGNOSIS — G92 Toxic encephalopathy: Secondary | ICD-10-CM | POA: Diagnosis present

## 2018-01-17 DIAGNOSIS — E878 Other disorders of electrolyte and fluid balance, not elsewhere classified: Secondary | ICD-10-CM | POA: Diagnosis not present

## 2018-01-17 DIAGNOSIS — N179 Acute kidney failure, unspecified: Secondary | ICD-10-CM | POA: Diagnosis present

## 2018-01-17 DIAGNOSIS — Q600 Renal agenesis, unilateral: Secondary | ICD-10-CM

## 2018-01-17 DIAGNOSIS — T428X2A Poisoning by antiparkinsonism drugs and other central muscle-tone depressants, intentional self-harm, initial encounter: Principal | ICD-10-CM | POA: Diagnosis present

## 2018-01-17 DIAGNOSIS — E876 Hypokalemia: Secondary | ICD-10-CM | POA: Diagnosis not present

## 2018-01-17 DIAGNOSIS — G8929 Other chronic pain: Secondary | ICD-10-CM | POA: Diagnosis present

## 2018-01-17 DIAGNOSIS — J15211 Pneumonia due to Methicillin susceptible Staphylococcus aureus: Secondary | ICD-10-CM | POA: Diagnosis present

## 2018-01-17 DIAGNOSIS — J9601 Acute respiratory failure with hypoxia: Secondary | ICD-10-CM | POA: Diagnosis present

## 2018-01-17 DIAGNOSIS — J9690 Respiratory failure, unspecified, unspecified whether with hypoxia or hypercapnia: Secondary | ICD-10-CM

## 2018-01-17 DIAGNOSIS — I959 Hypotension, unspecified: Secondary | ICD-10-CM | POA: Diagnosis not present

## 2018-01-17 DIAGNOSIS — J019 Acute sinusitis, unspecified: Secondary | ICD-10-CM | POA: Diagnosis present

## 2018-01-17 DIAGNOSIS — R579 Shock, unspecified: Secondary | ICD-10-CM | POA: Diagnosis not present

## 2018-01-17 DIAGNOSIS — T1491XA Suicide attempt, initial encounter: Secondary | ICD-10-CM

## 2018-01-17 DIAGNOSIS — F17210 Nicotine dependence, cigarettes, uncomplicated: Secondary | ICD-10-CM | POA: Diagnosis present

## 2018-01-17 DIAGNOSIS — R001 Bradycardia, unspecified: Secondary | ICD-10-CM | POA: Diagnosis present

## 2018-01-17 DIAGNOSIS — R059 Cough, unspecified: Secondary | ICD-10-CM

## 2018-01-17 DIAGNOSIS — F419 Anxiety disorder, unspecified: Secondary | ICD-10-CM | POA: Diagnosis present

## 2018-01-17 DIAGNOSIS — R571 Hypovolemic shock: Secondary | ICD-10-CM | POA: Diagnosis present

## 2018-01-17 DIAGNOSIS — K219 Gastro-esophageal reflux disease without esophagitis: Secondary | ICD-10-CM | POA: Diagnosis not present

## 2018-01-17 DIAGNOSIS — Z23 Encounter for immunization: Secondary | ICD-10-CM

## 2018-01-17 DIAGNOSIS — R569 Unspecified convulsions: Secondary | ICD-10-CM | POA: Diagnosis not present

## 2018-01-17 DIAGNOSIS — T391X1A Poisoning by 4-Aminophenol derivatives, accidental (unintentional), initial encounter: Secondary | ICD-10-CM | POA: Diagnosis present

## 2018-01-17 DIAGNOSIS — G4700 Insomnia, unspecified: Secondary | ICD-10-CM | POA: Diagnosis not present

## 2018-01-17 DIAGNOSIS — R4182 Altered mental status, unspecified: Secondary | ICD-10-CM

## 2018-01-17 LAB — BLOOD GAS, ARTERIAL
Acid-base deficit: 6.5 mmol/L — ABNORMAL HIGH (ref 0.0–2.0)
BICARBONATE: 19 mmol/L — AB (ref 20.0–28.0)
Drawn by: 221791
FIO2: 100
LHR: 20 {breaths}/min
MECHVT: 500 mL
O2 Saturation: 99.6 %
PEEP: 5 cmH2O
Patient temperature: 38.5
pCO2 arterial: 43 mmHg (ref 32.0–48.0)
pH, Arterial: 7.274 — ABNORMAL LOW (ref 7.350–7.450)
pO2, Arterial: 333 mmHg — ABNORMAL HIGH (ref 83.0–108.0)

## 2018-01-17 LAB — CBC WITH DIFFERENTIAL/PLATELET
ABS IMMATURE GRANULOCYTES: 0.24 10*3/uL — AB (ref 0.00–0.07)
Abs Immature Granulocytes: 0.06 10*3/uL (ref 0.00–0.07)
BASOS ABS: 0 10*3/uL (ref 0.0–0.1)
Basophils Absolute: 0.1 10*3/uL (ref 0.0–0.1)
Basophils Relative: 0 %
Basophils Relative: 0 %
EOS ABS: 0 10*3/uL (ref 0.0–0.5)
Eosinophils Absolute: 0.1 10*3/uL (ref 0.0–0.5)
Eosinophils Relative: 0 %
Eosinophils Relative: 1 %
HCT: 61.3 % — ABNORMAL HIGH (ref 39.0–52.0)
HCT: 57.2 % — ABNORMAL HIGH (ref 39.0–52.0)
Hemoglobin: 19 g/dL — ABNORMAL HIGH (ref 13.0–17.0)
Hemoglobin: 17.6 g/dL — ABNORMAL HIGH (ref 13.0–17.0)
IMMATURE GRANULOCYTES: 1 %
Immature Granulocytes: 1 %
LYMPHS ABS: 1.8 10*3/uL (ref 0.7–4.0)
Lymphocytes Relative: 8 %
Lymphocytes Relative: 13 %
Lymphs Abs: 1.6 10*3/uL (ref 0.7–4.0)
MCH: 29.4 pg (ref 26.0–34.0)
MCH: 28.9 pg (ref 26.0–34.0)
MCHC: 31 g/dL (ref 30.0–36.0)
MCHC: 30.8 g/dL (ref 30.0–36.0)
MCV: 94.7 fL (ref 80.0–100.0)
MCV: 93.9 fL (ref 80.0–100.0)
MONOS PCT: 6 %
Monocytes Absolute: 1.4 10*3/uL — ABNORMAL HIGH (ref 0.1–1.0)
Monocytes Absolute: 0.6 10*3/uL (ref 0.1–1.0)
Monocytes Relative: 5 %
NEUTROS PCT: 85 %
Neutro Abs: 18.8 10*3/uL — ABNORMAL HIGH (ref 1.7–7.7)
Neutro Abs: 9.9 10*3/uL — ABNORMAL HIGH (ref 1.7–7.7)
Neutrophils Relative %: 80 %
Platelets: 270 10*3/uL (ref 150–400)
Platelets: 144 10*3/uL — ABNORMAL LOW (ref 150–400)
RBC: 6.47 MIL/uL — ABNORMAL HIGH (ref 4.22–5.81)
RBC: 6.09 MIL/uL — AB (ref 4.22–5.81)
RDW: 14.2 % (ref 11.5–15.5)
RDW: 13.9 % (ref 11.5–15.5)
WBC: 22.2 10*3/uL — ABNORMAL HIGH (ref 4.0–10.5)
WBC: 12.3 10*3/uL — AB (ref 4.0–10.5)
nRBC: 0 % (ref 0.0–0.2)
nRBC: 0 % (ref 0.0–0.2)

## 2018-01-17 LAB — URINALYSIS, COMPLETE (UACMP) WITH MICROSCOPIC
BACTERIA UA: NONE SEEN
BILIRUBIN URINE: NEGATIVE
Glucose, UA: NEGATIVE mg/dL
KETONES UR: NEGATIVE mg/dL
LEUKOCYTES UA: NEGATIVE
Nitrite: NEGATIVE
PROTEIN: NEGATIVE mg/dL
Specific Gravity, Urine: 1.004 — ABNORMAL LOW (ref 1.005–1.030)
pH: 6 (ref 5.0–8.0)

## 2018-01-17 LAB — I-STAT CHEM 8, ED
BUN: 14 mg/dL (ref 6–20)
Calcium, Ion: 1.01 mmol/L — ABNORMAL LOW (ref 1.15–1.40)
Chloride: 110 mmol/L (ref 98–111)
Creatinine, Ser: 1.8 mg/dL — ABNORMAL HIGH (ref 0.61–1.24)
Glucose, Bld: 184 mg/dL — ABNORMAL HIGH (ref 70–99)
HCT: 61 % — ABNORMAL HIGH (ref 39.0–52.0)
Hemoglobin: 20.7 g/dL — ABNORMAL HIGH (ref 13.0–17.0)
Potassium: 4.7 mmol/L (ref 3.5–5.1)
SODIUM: 145 mmol/L (ref 135–145)
TCO2: 25 mmol/L (ref 22–32)

## 2018-01-17 LAB — COMPREHENSIVE METABOLIC PANEL
ALBUMIN: 4.4 g/dL (ref 3.5–5.0)
ALK PHOS: 72 U/L (ref 38–126)
ALT: 34 U/L (ref 0–44)
ANION GAP: 12 (ref 5–15)
AST: 32 U/L (ref 15–41)
BUN: 12 mg/dL (ref 6–20)
CALCIUM: 9.1 mg/dL (ref 8.9–10.3)
CO2: 22 mmol/L (ref 22–32)
Chloride: 110 mmol/L (ref 98–111)
Creatinine, Ser: 1.94 mg/dL — ABNORMAL HIGH (ref 0.61–1.24)
GFR calc Af Amer: 46 mL/min — ABNORMAL LOW (ref >60)
GFR calc non Af Amer: 40 mL/min — ABNORMAL LOW (ref >60)
Glucose, Bld: 187 mg/dL — ABNORMAL HIGH (ref 70–99)
Potassium: 3.6 mmol/L (ref 3.5–5.1)
SODIUM: 144 mmol/L (ref 135–145)
Total Bilirubin: 0.9 mg/dL (ref 0.3–1.2)
Total Protein: 7.9 g/dL (ref 6.5–8.1)

## 2018-01-17 LAB — INFLUENZA PANEL BY PCR (TYPE A & B)
Influenza A By PCR: NEGATIVE
Influenza B By PCR: NEGATIVE

## 2018-01-17 LAB — POCT I-STAT 3, ART BLOOD GAS (G3+)
Acid-base deficit: 8 mmol/L — ABNORMAL HIGH (ref 0.0–2.0)
Bicarbonate: 19 mmol/L — ABNORMAL LOW (ref 20.0–28.0)
O2 Saturation: 97 %
Patient temperature: 102.2
TCO2: 20 mmol/L — ABNORMAL LOW (ref 22–32)
pCO2 arterial: 46.9 mmHg (ref 32.0–48.0)
pH, Arterial: 7.226 — ABNORMAL LOW (ref 7.350–7.450)
pO2, Arterial: 123 mmHg — ABNORMAL HIGH (ref 83.0–108.0)

## 2018-01-17 LAB — LACTIC ACID, PLASMA
Lactic Acid, Venous: 4.3 mmol/L (ref 0.5–1.9)
Lactic Acid, Venous: 2.6 mmol/L (ref 0.5–1.9)

## 2018-01-17 LAB — RAPID URINE DRUG SCREEN, HOSP PERFORMED
Amphetamines: NOT DETECTED
BARBITURATES: NOT DETECTED
Benzodiazepines: NOT DETECTED
Cocaine: POSITIVE — AB
Opiates: POSITIVE — AB
Tetrahydrocannabinol: POSITIVE — AB

## 2018-01-17 LAB — EXPECTORATED SPUTUM ASSESSMENT W GRAM STAIN, RFLX TO RESP C

## 2018-01-17 LAB — GLUCOSE, CAPILLARY: Glucose-Capillary: 129 mg/dL — ABNORMAL HIGH (ref 70–99)

## 2018-01-17 LAB — TRIGLYCERIDES: Triglycerides: 205 mg/dL — ABNORMAL HIGH (ref <150)

## 2018-01-17 LAB — MRSA PCR SCREENING: MRSA BY PCR: NEGATIVE

## 2018-01-17 LAB — I-STAT CG4 LACTIC ACID, ED: LACTIC ACID, VENOUS: 4.83 mmol/L — AB (ref 0.5–1.9)

## 2018-01-17 LAB — I-STAT TROPONIN, ED: Troponin i, poc: 0.18 ng/mL (ref 0.00–0.08)

## 2018-01-17 LAB — ETHANOL: Alcohol, Ethyl (B): <10 mg/dL (ref <10)

## 2018-01-17 LAB — CBG MONITORING, ED: Glucose-Capillary: 185 mg/dL — ABNORMAL HIGH (ref 70–99)

## 2018-01-17 MED ORDER — CHLORHEXIDINE GLUCONATE 0.12% ORAL RINSE (MEDLINE KIT)
15.0000 mL | Freq: Two times a day (BID) | OROMUCOSAL | Status: DC
Start: 2018-01-17 — End: 2018-01-22
  Administered 2018-01-17 – 2018-01-22 (×10): 15 mL via OROMUCOSAL

## 2018-01-17 MED ORDER — SODIUM CHLORIDE 0.9 % IV BOLUS: 1000.0000 mL | Freq: Once | INTRAVENOUS | Status: DC

## 2018-01-17 MED ORDER — METRONIDAZOLE IN NACL 5-0.79 MG/ML-% IV SOLN
500.0000 mg | Freq: Three times a day (TID) | INTRAVENOUS | Status: DC
  Administered 2018-01-17 – 2018-01-18 (×3): 500 mg via INTRAVENOUS
  Filled 2018-01-17 (×3): qty 100

## 2018-01-17 MED ORDER — SUCCINYLCHOLINE CHLORIDE 20 MG/ML IJ SOLN
100.0000 mg | Freq: Once | INTRAMUSCULAR | Status: AC
  Administered 2018-01-17: 100 mg via INTRAVENOUS

## 2018-01-17 MED ORDER — FAMOTIDINE IN NACL 20-0.9 MG/50ML-% IV SOLN
20.0000 mg | Freq: Two times a day (BID) | INTRAVENOUS | Status: DC
  Administered 2018-01-17: 20 mg via INTRAVENOUS
  Filled 2018-01-17 (×2): qty 50

## 2018-01-17 MED ORDER — ACETAMINOPHEN 650 MG RE SUPP
650.0000 mg | Freq: Once | RECTAL | Status: AC
  Administered 2018-01-17: 650 mg via RECTAL

## 2018-01-17 MED ORDER — SODIUM CHLORIDE 0.9 % IV SOLN: 2.0000 g | INTRAVENOUS | Status: DC

## 2018-01-17 MED ORDER — MIDAZOLAM HCL 2 MG/2ML IJ SOLN
2.0000 mg | INTRAMUSCULAR | Status: DC | PRN
  Administered 2018-01-18: 2 mg via INTRAVENOUS
  Filled 2018-01-17 (×4): qty 2

## 2018-01-17 MED ORDER — ACETAMINOPHEN 650 MG RE SUPP
RECTAL | Status: AC
  Filled 2018-01-17: qty 1

## 2018-01-17 MED ORDER — SODIUM CHLORIDE 0.9 % IV SOLN
2.0000 g | Freq: Two times a day (BID) | INTRAVENOUS | Status: DC
  Administered 2018-01-17 – 2018-01-20 (×6): 2 g via INTRAVENOUS
  Filled 2018-01-17 (×6): qty 20

## 2018-01-17 MED ORDER — ETOMIDATE 2 MG/ML IV SOLN
10.0000 mg | Freq: Once | INTRAVENOUS | Status: AC
  Administered 2018-01-17: 10 mg via INTRAVENOUS

## 2018-01-17 MED ORDER — LORAZEPAM 2 MG/ML IJ SOLN
INTRAMUSCULAR | Status: AC
  Administered 2018-01-17: 1 mg via INTRAVENOUS
  Filled 2018-01-17: qty 1

## 2018-01-17 MED ORDER — SODIUM CHLORIDE 0.9 % IV BOLUS
1000.0000 mL | Freq: Once | INTRAVENOUS | Status: AC
  Administered 2018-01-17: 1000 mL via INTRAVENOUS

## 2018-01-17 MED ORDER — VANCOMYCIN HCL 10 G IV SOLR
2000.0000 mg | Freq: Once | INTRAVENOUS | Status: AC
  Administered 2018-01-17: 2000 mg via INTRAVENOUS
  Filled 2018-01-17: qty 2000

## 2018-01-17 MED ORDER — VANCOMYCIN HCL IN DEXTROSE 750-5 MG/150ML-% IV SOLN
750.0000 mg | Freq: Two times a day (BID) | INTRAVENOUS | Status: DC
  Administered 2018-01-18 (×2): 750 mg via INTRAVENOUS
  Filled 2018-01-17 (×2): qty 150

## 2018-01-17 MED ORDER — SODIUM CHLORIDE 0.9 % IV SOLN
250.0000 mL | INTRAVENOUS | Status: DC
  Administered 2018-01-22 – 2018-01-25 (×3): 250 mL via INTRAVENOUS

## 2018-01-17 MED ORDER — SODIUM CHLORIDE 0.9 % IV BOLUS (SEPSIS)
1000.0000 mL | Freq: Once | INTRAVENOUS | Status: AC
  Administered 2018-01-17: 1000 mL via INTRAVENOUS

## 2018-01-17 MED ORDER — PROPOFOL 1000 MG/100ML IV EMUL: 0.0000 ug/kg/min | INTRAVENOUS | Status: DC

## 2018-01-17 MED ORDER — ACETAMINOPHEN 325 MG PO TABS
650.0000 mg | ORAL_TABLET | ORAL | Status: DC | PRN
  Administered 2018-01-28 – 2018-02-02 (×8): 650 mg via ORAL
  Filled 2018-01-17 (×8): qty 2

## 2018-01-17 MED ORDER — SODIUM CHLORIDE 0.9 % IV SOLN: INTRAVENOUS | Status: DC

## 2018-01-17 MED ORDER — ORAL CARE MOUTH RINSE
15.0000 mL | OROMUCOSAL | Status: DC
Start: 2018-01-17 — End: 2018-01-22
  Administered 2018-01-17 – 2018-01-22 (×48): 15 mL via OROMUCOSAL

## 2018-01-17 MED ORDER — LORAZEPAM 2 MG/ML IJ SOLN
1.0000 mg | Freq: Once | INTRAMUSCULAR | Status: AC
  Administered 2018-01-17: 1 mg via INTRAVENOUS

## 2018-01-17 MED ORDER — SODIUM CHLORIDE 0.9 % IV SOLN: 250.0000 mL | INTRAVENOUS | Status: DC

## 2018-01-17 MED ORDER — NOREPINEPHRINE 4 MG/250ML-% IV SOLN
2.0000 ug/min | INTRAVENOUS | Status: DC
  Administered 2018-01-17: 2 ug/min via INTRAVENOUS
  Filled 2018-01-17: qty 250

## 2018-01-17 MED ORDER — DEXTROSE 5 % IV SOLN
10.0000 mg/kg | Freq: Three times a day (TID) | INTRAVENOUS | Status: DC
  Administered 2018-01-17 – 2018-01-18 (×2): 930 mg via INTRAVENOUS
  Filled 2018-01-17 (×3): qty 18.6

## 2018-01-17 MED ORDER — MIDAZOLAM HCL 2 MG/2ML IJ SOLN
2.0000 mg | INTRAMUSCULAR | Status: DC | PRN
  Administered 2018-01-18 – 2018-01-22 (×9): 2 mg via INTRAVENOUS
  Filled 2018-01-17 (×8): qty 2

## 2018-01-17 MED ORDER — SODIUM CHLORIDE 0.9 % IV SOLN
2.0000 g | Freq: Once | INTRAVENOUS | Status: AC
  Administered 2018-01-17: 2 g via INTRAVENOUS
  Filled 2018-01-17: qty 2

## 2018-01-17 MED ORDER — SODIUM CHLORIDE 0.9 % IV SOLN
INTRAVENOUS | Status: DC
  Administered 2018-01-17 – 2018-01-18 (×2): via INTRAVENOUS

## 2018-01-17 MED ORDER — VANCOMYCIN HCL IN DEXTROSE 1-5 GM/200ML-% IV SOLN: 1000.0000 mg | Freq: Once | INTRAVENOUS | Status: DC

## 2018-01-17 NOTE — Progress Notes (Signed)
Pharmacy Note:  Initial antibiotic(s) regimen of vancomycin and cefepime ordered by EDP to treat unknown source of infection.  Estimated Creatinine Clearance: 56.1 mL/min (A) (by C-G formula based on SCr of 1.8 mg/dL (H)).   No Known Allergies  Vitals:   01/17/18 1509 01/17/18 1510  BP:  (!) 84/62  Pulse: 78 79  Resp: 20 20  Temp: 99.5 F (37.5 C) 99.5 F (37.5 C)  SpO2:      Anti-infectives (From admission, onward)   Start     Dose/Rate Route Frequency Ordered Stop   01/17/18 1200  vancomycin (VANCOCIN) 2,000 mg in sodium chloride 0.9 % 500 mL IVPB     2,000 mg 250 mL/hr over 120 Minutes Intravenous  Once 01/17/18 1145 01/17/18 1452   01/17/18 1145  ceFEPIme (MAXIPIME) 2 g in sodium chloride 0.9 % 100 mL IVPB     2 g 200 mL/hr over 30 Minutes Intravenous  Once 01/17/18 1133 01/17/18 1253   01/17/18 1145  metroNIDAZOLE (FLAGYL) IVPB 500 mg     500 mg 100 mL/hr over 60 Minutes Intravenous Every 8 hours 01/17/18 1133     01/17/18 1145  vancomycin (VANCOCIN) IVPB 1000 mg/200 mL premix  Status:  Discontinued     1,000 mg 200 mL/hr over 60 Minutes Intravenous  Once 01/17/18 1133 01/17/18 1145      Plan: Initial dose(s) of Vancomycin 2000mg  and Cefepime 2gm IV X 1 ordered. F/U admission orders for further dosing if therapy continued.  Isac Sarna, BS [place], [place] Clinical Pharmacist Pager [phone] 01/17/2018 3:27 PM

## 2018-01-17 NOTE — ED Notes (Signed)
Family at bedside. 

## 2018-01-17 NOTE — H&P (Addendum)
PULMONARY / CRITICAL CARE MEDICINE   NAME:  Benjamin [name], MRN:  [id number], DOB:  08-28-1970, LOS: 0 ADMISSION DATE:  01/17/2018, CONSULTATION DATE: 01/17/2018 REFERRING MD: Emergency room, CHIEF COMPLAINT: Altered mental status  BRIEF HISTORY:    Benjamin [name] 47 year old male with history of polysubstance abuse coming in with fever altered mental status and new onset seizure intubated.   HISTORY OF PRESENT ILLNESS   47 year old male with history of chronic pain and polysubstance abuse coming in with altered mental status from outside hospital.  As per brother patient was in his usual state of health until yesterday afternoon at 2:00 when he went out came back at 7 PM and then around 27 PM patient started becoming very agitated and combative and then went to sleep.  His mother tried to wake him up this morning but he was very altered non-arousable so was brought into [facility] where he was obtunded hypotensive and had a seizure patient was intubated and due to requirement of vasopressors and possible sepsis patient was transferred to [facility] for further management.  Upon arrival to our hospital patient was on mechanical ventilation on Levophed drip not any sedation completely obtunded obviously not able to give any history and most of the history was taken from his brother.  As per his brother he does not recall him complaining of any headache nausea or vomiting.  He does state that his brother does polysubstance abuse and he suspected his trip yesterday afternoon was for that.  Patient drug screen came back positive for opiates cocaine and THC.  Patient received 6 L of IV fluid.  SIGNIFICANT PAST MEDICAL HISTORY   -Polysubstance abuse He  has a past medical history of Acute stomach ulcer, Anxiety, Arthritis, Broken back (1980's), Chronic back pain, Depression, Diverticulitis of duodenum, Duodenal diverticulum, GERD (gastroesophageal reflux disease), H/O clavicle fracture,  Headache(784.0), IBS (irritable bowel syndrome), Solitary kidney, congenital, and Spinal stenosis.  SIGNIFICANT EVENTS:  Altered mental status seizure intubated on 01/17/2018   STUDIES:   CT head 01/17/2018 Multifocal paranasal sinus disease with patient intubated. Brain parenchyma appears unremarkable. No mass or hemorrhage. CULTURES:  Blood culture>>  ANTIBIOTICS:  Ceftriaxone>> Vancomycin>> Acyclovir>>  LINES/TUBES:    CONSULTANTS:   SUBJECTIVE:    CONSTITUTIONAL: BP (!) 142/78   Pulse 72   Temp (!) 100.4 F (38 C)   Resp 18   Ht 5\' 8"  (1.727 m)   Wt 93 kg   SpO2 97%   BMI 31.17 kg/m   I/O last 3 completed shifts: In: Outlook [IV Piggyback:4700] Out: 1000 [Urine:1000]   Patient is on 5 mcg of Levophed  Vent Mode: PRVC FiO2 (%):  [50 %-100 %] 70 % Set Rate:  [20 bmp] 20 bmp Vt Set:  [500 mL-550 mL] 550 mL PEEP:  [5 cmH20-8 cmH20] 8 cmH20 Plateau Pressure:  [19 cmH20-26 cmH20] 20 cmH20  PHYSICAL EXAM: General: Acutely ill obtunded on mechanical ventilation Neuro: Pupils are 3 mm sluggish not following command not responding to pain obtunded HEENT: Endotracheal tube in position Cardiovascular: Normal heart sound no sounds or murmurs Lungs: Clear equal air sounds bilateral no crackles no wheezing Abdomen: Soft no tenderness no organomegaly Musculoskeletal: No lower limb edema Skin: No rash  RESOLVED PROBLEM LIST   ASSESSMENT AND PLAN    Assessment: Patient is coming in with altered mental status new onset seizure and fever associated with hypotension.  Patient labs as showing hemoconcentration which I suspect related to hypovolemia dehydration resulting in his  acute kidney injury though all of his symptoms could be explained by drug overdose, encephalitis/meningitis need to be ruled out especially with his fever.   -Acute respiratory insufficiency requiring mechanical ventilation for airway protection -Acute metabolic encephalopathy unknown cause  possibly related to drug overdose versus encephalitis -Lactic acidosis -Acute kidney injury - New onset seizure -Hypovolemic shock suspected septic shock -Polysubstance abuse -Leukocytosis   Plan: -Admit to the intensive care unit for further management -Start patient on ceftriaxone, vancomycin, acyclovir -Lumbar puncture under fluoroscopy guidance -Patient received 6 L of IV fluids I will start patient on maintenance of 125 an hour -Titrate Levophed to keep map above 65 -Follow urine output and renal function -Keep patient off sedation and reassess mental status -Consider MRI of the brain in the morning if patient does not wake up -SCD for DVT prophylaxis -GI prophylaxis -Follow blood cultures  I have spent 45 minutes of critical care time at bedside or in the unit at this time is exclusive of any billable procedures patient is needing intensive care due to respiratory failure require mechanical ventilation  SUMMARY OF TODAY'S PLAN:    Best Practice / Goals of Care / Disposition.   DVT PROPHYLAXIS: SCD for DVT prophylaxis SUP: PPI NUTRITION: N.p.o. MOBILITY: Bedrest GOALS OF CARE: Full code FAMILY DISCUSSIONS: Discussed with brother and sister at bedside DISPOSITION ICU admission  LABS  Glucose Recent Labs  Lab 01/17/18 1134 01/17/18 1928  GLUCAP 185* 129*    BMET Recent Labs  Lab 01/17/18 1128 01/17/18 1136  NA 144 145  K 3.6 4.7  CL 110 110  CO2 22  --   BUN 12 14  CREATININE 1.94* 1.80*  GLUCOSE 187* 184*    Liver Enzymes Recent Labs  Lab 01/17/18 1128  AST 32  ALT 34  ALKPHOS 72  BILITOT 0.9  ALBUMIN 4.4    Electrolytes Recent Labs  Lab 01/17/18 1128  CALCIUM 9.1    CBC Recent Labs  Lab 01/17/18 1128 01/17/18 1136  WBC 22.2*  --   HGB 19.0* 20.7*  HCT 61.3* 61.0*  PLT 270  --     ABG Recent Labs  Lab 01/17/18 1148  PHART 7.274*  PCO2ART 43.00  PO2ART 333*    Coag's No results for input(s): APTT, INR in the last 168  hours.  Sepsis Markers Recent Labs  Lab 01/17/18 1127 01/17/18 1553  LATICACIDVEN 4.83* 4.3*    Cardiac Enzymes No results for input(s): TROPONINI, PROBNP in the last 168 hours.  PAST MEDICAL HISTORY :   He  has a past medical history of Acute stomach ulcer, Anxiety, Arthritis, Broken back (1980's), Chronic back pain, Depression, Diverticulitis of duodenum, Duodenal diverticulum, GERD (gastroesophageal reflux disease), H/O clavicle fracture, Headache(784.0), IBS (irritable bowel syndrome), Solitary kidney, congenital, and Spinal stenosis.  PAST SURGICAL HISTORY:  He  has a past surgical history that includes Colonoscopy (N/A, 05/16/2012); Esophagogastroduodenoscopy (N/A, 09/29/2012); Esophagogastroduodenoscopy (N/A, 06/02/2013); Colonoscopy with propofol (N/A, 11/01/2017); Esophagogastroduodenoscopy (egd) with propofol (N/A, 11/01/2017); and polypectomy (11/01/2017).  No Known Allergies  No current facility-administered medications on file prior to encounter.    Current Outpatient Medications on File Prior to Encounter  Medication Sig  . buprenorphine (BUTRANS) 5 MCG/HR PTWK patch Place 5 mcg onto the skin once a week.  . busPIRone (BUSPAR) 10 MG tablet Take 10 mg by mouth 3 (three) times daily.  Marland Kitchen CARAFATE 1 GM/10ML suspension TAKE 10 MLS BY MOUTH FOUR TIMES DAILY. (Patient taking differently: Take 1 g by mouth 4 (four)  times daily. )  . citalopram (CELEXA) 20 MG tablet Take 20 mg by mouth daily.  Marland Kitchen DEXILANT 60 MG capsule TAKE ONE CAPSULE BY MOUTH ONCE DAILY. (Patient taking differently: Take 60 mg by mouth daily. )  . DULoxetine (CYMBALTA) 30 MG capsule Take 30 mg by mouth daily. Take 1 capsule twice daily  . haloperidol (HALDOL) 5 MG tablet Take 5 mg by mouth at bedtime.  . traZODone (DESYREL) 50 MG tablet Take 50-100 mg by mouth at bedtime as needed for sleep.   Marland Kitchen albuterol (PROVENTIL HFA;VENTOLIN HFA) 108 (90 Base) MCG/ACT inhaler Inhale 3 puffs into the lungs every 4 (four) hours  as needed for wheezing or shortness of breath.   . Menthol, Topical Analgesic, (ABSORBINE PAIN RELIEVING EX) Apply 1 patch topically daily as needed (for pain).    FAMILY HISTORY:   His family history includes Diabetes in his mother; Heart failure in his mother; Hypertension in his mother. There is no history of Colon cancer.  SOCIAL HISTORY:  He Could not be obtained due to patient altered mental status but as per his brother patient does drugs mostly and just and possibly sniffs  REVIEW OF SYSTEMS:    Could not be obtained due to patient altered mental status
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
## 2018-01-17 NOTE — ED Notes (Signed)
CRITICAL VALUE ALERT  Critical Value:  I-stat lac 4.83  Date & Time Notied:  01/17/2018 11:28  Provider Notified: Dr. Marye Round  Orders Received/Actions taken: see chart

## 2018-01-17 NOTE — ED Notes (Addendum)
Pt lives with family.  Pt was found by family in his room unresponsive. According to EMS, pt's family stated that pt was irate last night yelling and screaming, EMS was called last night for pt but they canceled the call.  Family told EMS that the pt hides all drug use from them so they are not aware of what all he takes.

## 2018-01-17 NOTE — Progress Notes (Signed)
Pt received [facility] from [facility]

## 2018-01-17 NOTE — Progress Notes (Signed)
CRITICAL VALUE ALERT  Critical Value:  LA 2.6  Date & Time Notied:  01-17-18 2140  Provider Notified: no  Orders Received/Actions taken: none

## 2018-01-17 NOTE — ED Notes (Signed)
CRITICAL VALUE ALERT  Critical Value: I-stat troponin 0.18  Date & Time Notied:  01/17/2018 11:37  Provider Notified: Dr. Marye Round  Orders Received/Actions taken: see chart
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
## 2018-01-17 NOTE — Progress Notes (Addendum)
Pharmacy Antibiotic Note  Benjamin [name] is a 47 y.o. male admitted on 01/17/2018 with meningitis.  Pharmacy has been consulted for vancomycin and acyclovir dosing.  Presenting AMS and new onset seizure. WBC 22.2, LA 4.83 >4.3, Tmax 101.3. Scr 1.8 (CrCl 56 mL/min) - slightly up from baseline of 1.1. Plan for LP under fluoroscopy guidance. Received 2 g IV vancomycin this morning at [facility] prior to transfer.   Discussed with MD for plan to consult ID given IV acyclovir shortage. Will provide IV dosing until able to get LP and/or discuss with ID.   Plan: Change ceftriaxone 2 g IV every 12 hours. Vancomycin 750 IV every 12 hours.  Goal trough 15-20 mcg/mL.  Acyclovir 930 mg (10 mg/kg) IV every 8 hours Monitor clinical pic, cx results, and VT as indicated  Height: 5\' 8"  (172.7 cm) Weight: 205 lb (93 kg) IBW/kg (Calculated) : 68.4  Temp (24hrs), Avg:100 F (37.8 C), Min:99 F (37.2 C), Max:102 F (38.9 C)  Recent Labs  Lab 01/17/18 1127 01/17/18 1128 01/17/18 1136 01/17/18 1553  WBC  --  22.2*  --   --   CREATININE  --  1.94* 1.80*  --   LATICACIDVEN 4.83*  --   --  4.3*    Estimated Creatinine Clearance: 56.1 mL/min (A) (by C-G formula based on SCr of 1.8 mg/dL (H)).    No Known Allergies  Antimicrobials this admission: Vancomycin 12/30 >>  Ceftriaxone 12/30 >>  Acyclovir 12/30>> Cefepime/metrondiazole 12/30  Dose adjustments this admission: N/A  Microbiology results: 12/30 BCx: sent 12/30 Sputum: sent  12/30 MRSA PCR: sent  Thank you for allowing pharmacy to be a part of this patient's care.  Antonietta Jewel, PharmD, [place] Clinical Pharmacist  Pager: [phone] Phone: [phone] 01/17/2018 8:19 PM

## 2018-01-18 ENCOUNTER — Inpatient Hospital Stay (HOSPITAL_COMMUNITY): Payer: Medicaid Other

## 2018-01-18 LAB — POCT I-STAT 3, ART BLOOD GAS (G3+)
ACID-BASE DEFICIT: 7 mmol/L — AB (ref 0.0–2.0)
Bicarbonate: 18.3 mmol/L — ABNORMAL LOW (ref 20.0–28.0)
O2 Saturation: 97 %
Patient temperature: 37.6
TCO2: 19 mmol/L — ABNORMAL LOW (ref 22–32)
pCO2 arterial: 37.4 mmHg (ref 32.0–48.0)
pH, Arterial: 7.301 — ABNORMAL LOW (ref 7.350–7.450)
pO2, Arterial: 98 mmHg (ref 83.0–108.0)

## 2018-01-18 LAB — BASIC METABOLIC PANEL
Anion gap: 11 (ref 5–15)
BUN: 9 mg/dL (ref 6–20)
CO2: 18 mmol/L — ABNORMAL LOW (ref 22–32)
Calcium: 7.6 mg/dL — ABNORMAL LOW (ref 8.9–10.3)
Chloride: 118 mmol/L — ABNORMAL HIGH (ref 98–111)
Creatinine, Ser: 1.5 mg/dL — ABNORMAL HIGH (ref 0.61–1.24)
GFR calc Af Amer: >60 mL/min (ref >60)
GFR calc non Af Amer: 55 mL/min — ABNORMAL LOW (ref >60)
GLUCOSE: 110 mg/dL — AB (ref 70–99)
POTASSIUM: 3.5 mmol/L (ref 3.5–5.1)
Sodium: 147 mmol/L — ABNORMAL HIGH (ref 135–145)

## 2018-01-18 LAB — CBC
HCT: 52.1 % — ABNORMAL HIGH (ref 39.0–52.0)
Hemoglobin: 16.3 g/dL (ref 13.0–17.0)
MCH: 29.7 pg (ref 26.0–34.0)
MCHC: 31.3 g/dL (ref 30.0–36.0)
MCV: 95.1 fL (ref 80.0–100.0)
PLATELETS: 109 10*3/uL — AB (ref 150–400)
RBC: 5.48 MIL/uL (ref 4.22–5.81)
RDW: 13.8 % (ref 11.5–15.5)
WBC: 12.1 10*3/uL — ABNORMAL HIGH (ref 4.0–10.5)
nRBC: 0 % (ref 0.0–0.2)

## 2018-01-18 LAB — PROTEIN, CSF: Total  Protein, CSF: 15 mg/dL (ref 15–45)

## 2018-01-18 LAB — CSF CELL COUNT WITH DIFFERENTIAL
RBC COUNT CSF: 2 /mm3 — AB
Tube #: 3
WBC, CSF: 1 /mm3 (ref 0–5)

## 2018-01-18 LAB — HIV ANTIBODY (ROUTINE TESTING W REFLEX): HIV Screen 4th Generation wRfx: NONREACTIVE

## 2018-01-18 LAB — LACTIC ACID, PLASMA: Lactic Acid, Venous: 2.1 mmol/L (ref 0.5–1.9)

## 2018-01-18 LAB — TRIGLYCERIDES: Triglycerides: 283 mg/dL — ABNORMAL HIGH (ref <150)

## 2018-01-18 LAB — GLUCOSE, CSF: Glucose, CSF: 80 mg/dL — ABNORMAL HIGH (ref 40–70)

## 2018-01-18 MED ORDER — LIDOCAINE HCL (PF) 1 % IJ SOLN
5.0000 mL | Freq: Once | INTRAMUSCULAR | Status: AC
  Administered 2018-01-18: 5 mL via INTRADERMAL

## 2018-01-18 MED ORDER — LORAZEPAM 2 MG/ML IJ SOLN
INTRAMUSCULAR | Status: AC
  Administered 2018-01-18: 2 mg
  Filled 2018-01-18: qty 1

## 2018-01-18 MED ORDER — LACTATED RINGERS IV SOLN
INTRAVENOUS | Status: DC
  Administered 2018-01-18 – 2018-01-19 (×3): via INTRAVENOUS

## 2018-01-18 MED ORDER — SODIUM CHLORIDE 0.9 % IV SOLN
2500.0000 mg | Freq: Once | INTRAVENOUS | Status: AC
  Administered 2018-01-18: 2500 mg via INTRAVENOUS
  Filled 2018-01-18: qty 25

## 2018-01-18 MED ORDER — INFLUENZA VAC SPLIT QUAD 0.5 ML IM SUSY
0.5000 mL | PREFILLED_SYRINGE | INTRAMUSCULAR | Status: AC
  Administered 2018-01-19: 0.5 mL via INTRAMUSCULAR

## 2018-01-18 MED ORDER — HEPARIN SODIUM (PORCINE) 5000 UNIT/ML IJ SOLN
5000.0000 [IU] | Freq: Three times a day (TID) | INTRAMUSCULAR | Status: DC
  Administered 2018-01-18 – 2018-02-02 (×45): 5000 [IU] via SUBCUTANEOUS
  Filled 2018-01-18 (×44): qty 1

## 2018-01-18 MED ORDER — VALACYCLOVIR HCL 500 MG PO TABS: 2000.0000 mg | ORAL_TABLET | Freq: Three times a day (TID) | ORAL | Status: DC

## 2018-01-18 MED ORDER — PROPOFOL 1000 MG/100ML IV EMUL
INTRAVENOUS | Status: AC
  Administered 2018-01-18: 40 ug/kg/min via INTRAVENOUS
  Filled 2018-01-18: qty 100

## 2018-01-18 MED ORDER — PROPOFOL 1000 MG/100ML IV EMUL
5.0000 ug/kg/min | INTRAVENOUS | Status: DC
  Administered 2018-01-18 (×4): 40 ug/kg/min via INTRAVENOUS
  Administered 2018-01-19: 50 ug/kg/min via INTRAVENOUS
  Administered 2018-01-19: 40 ug/kg/min via INTRAVENOUS
  Administered 2018-01-19: 50 ug/kg/min via INTRAVENOUS
  Administered 2018-01-19 (×2): 40 ug/kg/min via INTRAVENOUS
  Administered 2018-01-20 (×4): 50 ug/kg/min via INTRAVENOUS
  Administered 2018-01-20: 60 ug/kg/min via INTRAVENOUS
  Administered 2018-01-20: 50 ug/kg/min via INTRAVENOUS
  Administered 2018-01-20: 40 ug/kg/min via INTRAVENOUS
  Administered 2018-01-21: 45 ug/kg/min via INTRAVENOUS
  Administered 2018-01-21: 50 ug/kg/min via INTRAVENOUS
  Administered 2018-01-21: 40 ug/kg/min via INTRAVENOUS
  Filled 2018-01-18 (×4): qty 100
  Filled 2018-01-18: qty 200
  Filled 2018-01-18 (×6): qty 100
  Filled 2018-01-18: qty 200
  Filled 2018-01-18 (×3): qty 100

## 2018-01-18 MED ORDER — PANTOPRAZOLE SODIUM 40 MG IV SOLR
40.0000 mg | INTRAVENOUS | Status: DC
  Administered 2018-01-18 – 2018-01-21 (×4): 40 mg via INTRAVENOUS
  Filled 2018-01-18 (×4): qty 40

## 2018-01-18 MED ORDER — LEVETIRACETAM IN NACL 1000 MG/100ML IV SOLN
1000.0000 mg | Freq: Two times a day (BID) | INTRAVENOUS | Status: DC
  Administered 2018-01-18 – 2018-01-26 (×16): 1000 mg via INTRAVENOUS
  Filled 2018-01-18 (×16): qty 100

## 2018-01-18 NOTE — Progress Notes (Addendum)
Pt has OG to LIS. Has put out ~1762ml this shift. Spoke w/ Dr. Lake Bells. ABD xray to be ordered, concerns for ileus.

## 2018-01-18 NOTE — Progress Notes (Signed)
Initial Nutrition Assessment  DOCUMENTATION CODES:   Not applicable  INTERVENTION:   If unable to extubate within 24-48 hours, recommend initiation of tube feeding  Tube Feeding:  Vital AF 1.2 @ 50 ml/hr Pro-Stat 30 mL TID Provides 135 g of protein, 1740 kcals and 972 mL of free water Meets 100% protein needs  TF regimen and propofol at current rate providing 2275 total kcal/day (101 % of kcal needs)  NUTRITION DIAGNOSIS:   Inadequate oral intake related to acute illness as evidenced by NPO status.  GOAL:   Patient will meet greater than or equal to 90% of their needs  MONITOR:   Vent status, TF tolerance, Labs, Weight trends  REASON FOR ASSESSMENT:   Ventilator    ASSESSMENT:    47 yo male admitted with AMS and new onset seizures requiring intubation, concern for meningitis/encephaliti. +cocaine, THC, opiates. PMH includes spinal stenosis, diverticulitis, gastric ulcer, IBS    12/31 LP  Patient is currently intubated on ventilator support MV: 10.6 L/min Temp (24hrs), Avg:100 F (37.8 C), Min:98.4 F (36.9 C), Max:102.2 F (39 C)  Propofol: 20.3 ml/hr  No family at bedside; unable to obtain diet and weight history at this time  Labs: sodium 147, Creatinine 1.50, BUN wdl Meds: LR at 100 ml/hr  Diet Order:   Diet Order            Diet NPO time specified  Diet effective now              EDUCATION NEEDS:   Not appropriate for education at this time  Skin:  Skin Assessment: Reviewed RN Assessment  Last BM:  12/31  Height:   Ht Readings from Last 1 Encounters:  01/17/18 5\' 8"  (1.727 m)    Weight:   Wt Readings from Last 1 Encounters:  01/18/18 84.5 kg    Ideal Body Weight:  70 kg  BMI:  Body mass index is 28.33 kg/m.  Estimated Nutritional Needs:   Kcal:  2260 kcals   Protein:  127-169 g   Fluid:  >/= 2 L  Kerman Passey MS, RD, LDN, CNSC [phone] Pager  [phone] Weekend/On-Call Pager
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
## 2018-01-18 NOTE — Plan of Care (Signed)
Less vent support needed since admission

## 2018-01-18 NOTE — Procedures (Signed)
History: 47 year old male with a seizure last night, persistent encephalopathy  Sedation: Versed given earlier in the day  Technique: This is a 21 channel routine scalp EEG performed at the bedside with bipolar and monopolar montages arranged in accordance to the international 10/20 system of electrode placement. One channel was dedicated to EKG recording.    Background: The background is completely flat with intermittent bursts consisting of a herald spike followed by generalized theta activity with evolution to delta with occasional intermixed sharp wave activity in the delta portion.  These bursts this then gives way to a completely suppressed EEG once again.  These bursts lasts 5 to 10 seconds with 5 to 10-second interburst intervals.  At the onset of each burst of activity, his eyes open, but there is no other clear clinical correlate.   Photic stimulation: Physiologic driving is not performed  EEG Abnormalities: 1) burst of activity as described above concerning for brief ictal discharges  Clinical Interpretation: This EEG is profoundly abnormal with a discontinuous background and burst of activity concerning for brief ictal discharges.  In this clinical setting, I am concerned that this may represent "burned-out" status epilepticus.   Roland Rack, MD [facility] [phone]  If 7pm- 7am, please page neurology on call as listed in [place].
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
## 2018-01-18 NOTE — Progress Notes (Signed)
Overnight  EEG w/ video currently running. No skin break down at time of hook up.

## 2018-01-18 NOTE — Progress Notes (Signed)
CRITICAL VALUE ALERT  Critical Value:  LA 2.1  Date & Time Notied:  01-18-18 0057  Provider Notified: No   Orders Received/Actions taken: None

## 2018-01-18 NOTE — Procedures (Signed)
Procedure: LP w fluoro guidance at L2/3. Specimen: CSF, to lab Bleeding: none. Complications: None immediate. Patient   -Condition: Stable.  -Disposition:  Return to ICU.  Full Radiology Report to follow under IMAGING

## 2018-01-18 NOTE — Progress Notes (Signed)
Pt transported from 3M03 to Radiology for a lumbar puncture on the ventilator. Pt tolerated well, vital signs stable throughout.

## 2018-01-18 NOTE — Progress Notes (Signed)
Spoke w/ Dr. Leonel Ramsay, Neuro. Per MD, neuro to titrate propofol.

## 2018-01-18 NOTE — Progress Notes (Signed)
EEG completed; results pending.    

## 2018-01-19 ENCOUNTER — Inpatient Hospital Stay (HOSPITAL_COMMUNITY): Payer: Medicaid Other

## 2018-01-19 LAB — CBC
HEMATOCRIT: 44.4 % (ref 39.0–52.0)
Hemoglobin: 14.8 g/dL (ref 13.0–17.0)
MCH: 30 pg (ref 26.0–34.0)
MCHC: 33.3 g/dL (ref 30.0–36.0)
MCV: 89.9 fL (ref 80.0–100.0)
Platelets: 133 10*3/uL — ABNORMAL LOW (ref 150–400)
RBC: 4.94 MIL/uL (ref 4.22–5.81)
RDW: 13.7 % (ref 11.5–15.5)
WBC: 9.9 10*3/uL (ref 4.0–10.5)
nRBC: 0 % (ref 0.0–0.2)

## 2018-01-19 LAB — BASIC METABOLIC PANEL
ANION GAP: 13 (ref 5–15)
Anion gap: 8 (ref 5–15)
BUN: 11 mg/dL (ref 6–20)
BUN: 14 mg/dL (ref 6–20)
CHLORIDE: 109 mmol/L (ref 98–111)
CO2: 28 mmol/L (ref 22–32)
CO2: 23 mmol/L (ref 22–32)
CREATININE: 1.31 mg/dL — AB (ref 0.61–1.24)
Calcium: 8.8 mg/dL — ABNORMAL LOW (ref 8.9–10.3)
Calcium: 8.4 mg/dL — ABNORMAL LOW (ref 8.9–10.3)
Chloride: 110 mmol/L (ref 98–111)
Creatinine, Ser: 1.21 mg/dL (ref 0.61–1.24)
GFR calc Af Amer: >60 mL/min (ref >60)
GFR calc Af Amer: >60 mL/min (ref >60)
GFR calc non Af Amer: >60 mL/min (ref >60)
GFR calc non Af Amer: >60 mL/min (ref >60)
GLUCOSE: 116 mg/dL — AB (ref 70–99)
Glucose, Bld: 103 mg/dL — ABNORMAL HIGH (ref 70–99)
POTASSIUM: 2.6 mmol/L — AB (ref 3.5–5.1)
Potassium: 2.6 mmol/L — CL (ref 3.5–5.1)
Sodium: 145 mmol/L (ref 135–145)
Sodium: 146 mmol/L — ABNORMAL HIGH (ref 135–145)

## 2018-01-19 LAB — PHOSPHORUS: Phosphorus: 1.2 mg/dL — ABNORMAL LOW (ref 2.5–4.6)

## 2018-01-19 LAB — MAGNESIUM: Magnesium: 2.1 mg/dL (ref 1.7–2.4)

## 2018-01-19 LAB — AMMONIA: Ammonia: 45 umol/L — ABNORMAL HIGH (ref 9–35)

## 2018-01-19 LAB — GLUCOSE, CAPILLARY: Glucose-Capillary: 112 mg/dL — ABNORMAL HIGH (ref 70–99)

## 2018-01-19 MED ORDER — POTASSIUM CHLORIDE 10 MEQ/100ML IV SOLN
10.0000 meq | INTRAVENOUS | Status: AC
  Administered 2018-01-19 (×6): 10 meq via INTRAVENOUS
  Filled 2018-01-19 (×6): qty 100

## 2018-01-19 MED ORDER — VALPROATE SODIUM 500 MG/5ML IV SOLN
2500.0000 mg | Freq: Once | INTRAVENOUS | Status: AC
  Administered 2018-01-19: 2500 mg via INTRAVENOUS
  Filled 2018-01-19: qty 25

## 2018-01-19 MED ORDER — KCL IN DEXTROSE-NACL 20-5-0.45 MEQ/L-%-% IV SOLN
INTRAVENOUS | Status: DC
  Administered 2018-01-19 – 2018-01-20 (×3): via INTRAVENOUS
  Filled 2018-01-19 (×4): qty 1000

## 2018-01-19 MED ORDER — VALPROATE SODIUM 500 MG/5ML IV SOLN
500.0000 mg | Freq: Three times a day (TID) | INTRAVENOUS | Status: DC
  Administered 2018-01-19 – 2018-01-22 (×9): 500 mg via INTRAVENOUS
  Filled 2018-01-19 (×10): qty 5

## 2018-01-19 MED ORDER — LACTATED RINGERS IV BOLUS
1000.0000 mL | Freq: Once | INTRAVENOUS | Status: AC
  Administered 2018-01-19: 1000 mL via INTRAVENOUS

## 2018-01-19 NOTE — Progress Notes (Addendum)
Neurology Progress Note   S:// Patient seen and examined.  Has some right toe twitching noted by RN on reducing sedation.   O:// Current vital signs: BP (!) 156/100   Pulse (!) 107   Temp (!) 97.3 F (36.3 C)   Resp (!) 33   Ht '5\' 8"'$  (1.727 m)   Wt 84.5 kg   SpO2 97%   BMI 28.33 kg/m  Vital signs in last 24 hours: Temp:  [97.2 F (36.2 C)-99.5 F (37.5 C)] 97.3 F (36.3 C) (01/01 0900) Pulse Rate:  [62-107] 107 (01/01 1109) Resp:  [17-33] 33 (01/01 1109) BP: (89-156)/(57-100) 156/100 (01/01 1109) SpO2:  [90 %-100 %] 97 % (01/01 1109) FiO2 (%):  [40 %] 40 % (01/01 1109) Weight:  [84.5 kg] 84.5 kg (01/01 0413) General: Patient is sedated intubated HEENT: Normocephalic atraumatic Lungs: Clear to auscultation Cardiovascular: S1 is heard regular rhythm Neurological exam Patient's sedated intubated Propofol was held for the duration of the exam. No spontaneous movements noted Cranial nerves: Breathing over the ventilator, pupils are 7 mm round react down to 5 mm with light, corneals present, oculocephalics present, cough and gag present Motor exam: No spontaneous movement.  Extends upper extremity not stimulation.  Some flexion in the lower extremities to noxious stimulation with some spontaneous twitching noted intermittently by RN. Sensory exam: As above  Medications  Current Facility-Administered Medications:  .  0.9 %  sodium chloride infusion, 250 mL, Intravenous, Continuous, Dorie Rank, MD .  0.9 %  sodium chloride infusion, 250 mL, Intravenous, Continuous, Aljishi, Virgina Norfolk, MD .  acetaminophen (TYLENOL) tablet 650 mg, 650 mg, Oral, Q4H PRN, Aljishi, Wael Z, MD .  cefTRIAXone (ROCEPHIN) 2 g in sodium chloride 0.9 % 100 mL IVPB, 2 g, Intravenous, Q12H, Alvira Philips, [place], Last Rate: 200 mL/hr at 01/19/18 0935, 2 g at 01/19/18 0935 .  chlorhexidine gluconate (MEDLINE KIT) (PERIDEX) 0.12 % solution 15 mL, 15 mL, Mouth Rinse, BID, Aljishi, Virgina Norfolk, MD, 15 mL at  01/19/18 0745 .  dextrose 5 % and 0.45 % NaCl with KCl 20 mEq/L infusion, , Intravenous, Continuous, McQuaid, Douglas B, MD, Last Rate: 100 mL/hr at 01/19/18 1214 .  heparin injection 5,000 Units, 5,000 Units, Subcutaneous, Q8H, Harvel Quale, RPH, 5,000 Units at 01/19/18 0606 .  lactated ringers bolus 1,000 mL, 1,000 mL, Intravenous, Once, Simonne Maffucci B, MD, Last Rate: 983.6 mL/hr at 01/19/18 1221, 1,000 mL at 01/19/18 1221 .  levETIRAcetam (KEPPRA) IVPB 1000 mg/100 mL premix, 1,000 mg, Intravenous, Q12H, McQuaid, Douglas B, MD, Last Rate: 400 mL/hr at 01/19/18 1017, 1,000 mg at 01/19/18 1017 .  MEDLINE mouth rinse, 15 mL, Mouth Rinse, 10 times per day, Aldean Jewett, MD, 15 mL at 01/19/18 1139 .  midazolam (VERSED) injection 2 mg, 2 mg, Intravenous, Q15 min PRN, Deterding, Guadelupe Sabin, MD, 2 mg at 01/18/18 0947 .  midazolam (VERSED) injection 2 mg, 2 mg, Intravenous, Q2H PRN, Deterding, Guadelupe Sabin, MD, 2 mg at 01/18/18 1025 .  norepinephrine (LEVOPHED) '4mg'$  in D5W 269m premix infusion, 2-10 mcg/min, Intravenous, Titrated, KDorie Rank MD, Stopped at 01/17/18 2053 .  pantoprazole (PROTONIX) injection 40 mg, 40 mg, Intravenous, Q24H, Desai, Rahul P, PA-C, 40 mg at 01/19/18 0744 .  potassium chloride 10 mEq in 100 mL IVPB, 10 mEq, Intravenous, Q1 Hr x 6, McQuaid, Douglas B, MD .  propofol (DIPRIVAN) 1000 MG/100ML infusion, 5-80 mcg/kg/min, Intravenous, Titrated, McQuaid, Douglas B, MD, Last Rate: 25.4 mL/hr at 01/19/18 1102, 50 mcg/kg/min  at 01/19/18 1102 .  valproate (DEPACON) 500 mg in dextrose 5 % 50 mL IVPB, 500 mg, Intravenous, Q8H, Greta Doom, MD Labs CBC    Component Value Date/Time   WBC 9.9 01/19/2018 0707   RBC 4.94 01/19/2018 0707   HGB 14.8 01/19/2018 0707   HCT 44.4 01/19/2018 0707   PLT 133 (L) 01/19/2018 0707   MCV 89.9 01/19/2018 0707   MCH 30.0 01/19/2018 0707   MCHC 33.3 01/19/2018 0707   RDW 13.7 01/19/2018 0707   LYMPHSABS 1.6 01/17/2018 2040    MONOABS 0.6 01/17/2018 2040   EOSABS 0.1 01/17/2018 2040   BASOSABS 0.0 01/17/2018 2040    CMP     Component Value Date/Time   NA 145 01/19/2018 0707   K 2.6 (LL) 01/19/2018 0707   CL 109 01/19/2018 0707   CO2 28 01/19/2018 0707   GLUCOSE 103 (H) 01/19/2018 0707   BUN 11 01/19/2018 0707   CREATININE 1.31 (H) 01/19/2018 0707   CREATININE 1.22 08/06/2017 1619   CALCIUM 8.8 (L) 01/19/2018 0707   PROT 7.9 01/17/2018 1128   ALBUMIN 4.4 01/17/2018 1128   AST 32 01/17/2018 1128   ALT 34 01/17/2018 1128   ALKPHOS 72 01/17/2018 1128   BILITOT 0.9 01/17/2018 1128   GFRNONAA >60 01/19/2018 0707   GFRAA >60 01/19/2018 0707   Imaging I have reviewed images in epic and the results pertinent to this consultation are: CT head: Multifocal paranasal sinus disease with changes of intubation. No acute changes otherwise.  Assessment:  48 year old man with new onset seizure, LP not suggestive of infectious etiology and EEG demonstrating abnormal discharges which might have represented end-stage/burnt out status epilepticus. Yesterday on exam, he had withdrawal to noxious stimulation despite a profoundly abnormal EEG. Today he exhibits abnormal extensor posturing in the upper extremities to noxious stimulation and some flexion in the lower extremities. I would favor a hypoxic/anoxic injury as an underlying etiology for his presentation with seizures. He will need an MRI-we will continue EEG for now and plan an MRI for tomorrow morning.  Concerning event happened for seizures with no real electrographic change.  No benzos for now.  Recommendations: Keppra 1 g twice daily Depakote was added after the EEG was reviewed by Dr. Leonel Ramsay.  Continue Depakote. Continue EEG for now. Lighten propofol.  Currently on 50-reduced to 40 and by 6 PM reduced to 30.  Keep on the minimum dose required to maintain comfort. MRI tomorrow morning-we will discontinue EEG then overnight.. Neurology will continue to  follow.  -- Amie Portland, MD [facility] Pager: [phone] If 7pm to 7am, please call on call as listed on [place].  CRITICAL CARE ATTESTATION Performed by: Amie Portland, MD Total critical care time: 30 minutes Critical care time was exclusive of separately billable procedures and treating other patients and/or supervising APPs/Residents/Students Critical care was necessary to treat or prevent imminent or life-threatening deterioration due to status epilepticus, possible anoxic brain injury This patient is critically ill and at significant risk for neurological worsening and/or death and care requires constant monitoring. Critical care was time spent personally by me on the following activities: development of treatment plan with patient and/or surrogate as well as nursing, discussions with consultants, evaluation of patient's response to treatment, examination of patient, obtaining history from patient or surrogate, ordering and performing treatments and interventions, ordering and review of laboratory studies, ordering and review of radiographic studies, pulse oximetry, re-evaluation of patient's condition, participation in multidisciplinary rounds and medical decision making of  high complexity in the care of this patient.

## 2018-01-19 NOTE — Progress Notes (Signed)
Noted chest heaving and quivering some - assuming it was possible seizure like activity - pushed pt event button on EEG machine and pulled Versed to give - Neurologist here reviewed event and said not to give Versed that the EEG had not changed much

## 2018-01-19 NOTE — Procedures (Signed)
LTM-EEG Report  HISTORY: Continuous video-EEG monitoring performed for41year old with possible seizures and abnormal EEG. ACQUISITION: International 10-20 system for electrode placement; 18 channels with additional eyes linked to ipsilateral ears and EKG. Additional T1-T2 electrodes were used. Continuous video recording obtained.   EEG NUMBER:  MEDICATIONS:  Day1:see EMR  DAY #1: [id number] 12/31/19to0730 01/19/18  BACKGROUND: An overalllowvoltage, burst suppression record with some reactivity. There were periods of attenuation lasting 5-10 seconds initially, later becoming shorter as sedation was weaned. Bursts initially lasted 1-2 seconds, later 2-5 seconds, consisting of rhythmic 1-3Hz  activity. Bursts showed more sharp contours later in the record.  EPILEPTIFORM/PERIODIC ACTIVITY:Bursts of rhythmic slowing showed some sharp contours at times SEIZURES:no EVENTS:no  EKG: no significant arrhythmia  SUMMARY: This wasan abnormal continuous video EEG due to aburst-suppression pattern which has decreased overnight. Bursts of activity contained rhythmic slowing with sharp contours, indicative of underlying diffuse cortical irritability. No electrographic seizures were seen however.

## 2018-01-19 NOTE — Progress Notes (Signed)
NAME:  CASTER FAYETTE, MRN:  [id number], DOB:  1970/04/06, LOS: 2 ADMISSION DATE:  01/17/2018, CONSULTATION DATE:  01/17/18 REFERRING MD:  EDP, CHIEF COMPLAINT:  Confusion   Brief History   48 y/o male with polysubstance abuse admitted with acute encephalopathy and seizure, intubated in ER for airway protection.   Past Medical History  Polysubstance abuse, anxiety, chronic back pain, diveritulitis, GERD, Solitary kidney, spinal stenosis  Significant Hospital Events   12/30 admission, intubation 12/31 worrisome findings for recent status epilepticus, continuous EEG monitoring arranged, started propofol  Consults:  Neurology  Procedures:  12/31 lumbar puncture> no pleocytosis  Significant Diagnostic Tests:  CT head 12/30 > Multifocal paranasal sinus disease with patient intubated. Brain parenchyma appears unremarkable. No mass or hemorrhage. EEG 12/31> worrisome for burst suppression, recent status epilepticus  Micro Data:  Blood 12/30 >  CSF 12/31 >  Antimicrobials:  Ceftriaxone 12/31 > Vancomycin 12/31 > 12/31 Acyclovir 12/31 > 12/31  Interim history/subjective:  Thick secretions Remains unresponsive Remains on EEG  Objective   Blood pressure 132/82, pulse 90, temperature (!) 97.3 F (36.3 C), resp. rate 18, height 5\' 8"  (1.727 m), weight 84.5 kg, SpO2 96 %.    Vent Mode: PRVC FiO2 (%):  [40 %] 40 % Set Rate:  [27 bmp] 27 bmp Vt Set:  [410 mL] 410 mL PEEP:  [5 cmH20] 5 cmH20 Plateau Pressure:  [13 cmH20-17 cmH20] 17 cmH20   Intake/Output Summary (Last 24 hours) at 01/19/2018 1106 Last data filed at 01/19/2018 7322 Gross per 24 hour  Intake 2458.27 ml  Output 3303 ml  Net -844.73 ml   Filed Weights   01/17/18 1130 01/18/18 0500 01/19/18 0413  Weight: 93 kg 84.5 kg 84.5 kg    Examination:  General:  In bed on vent HENT: NCAT ETT in place PULM: CTA B, vent supported breathing CV: RRR, no mgr GI: BS+, soft, nontender MSK: normal bulk and tone Neuro:  sedated on vent    Resolved Hospital Problem list     Assessment & Plan:  Acute encephalopathy, seizure > continue keppra, propofol and valproate > f/u continuous EEG results with neurology > will need MRI brain  > check ammonia  Acute respiratory failure with hypoxemia > remain on full vent support until more awake and alert > VAP prevention > pulm toilette  Severe sepsis due to Pneumonia in RLL > continue ceftriaxone  AKI: improving > monitor hemodynamics > Monitor BMET and UOP > Replace electrolytes as needed  Hypokalemia > replace with IV runs of KCl and D5 1/2 NS with KCl  Polysubstance abuse > monitor for sign of withdrawal   Best practice:  Diet: tube feeding Pain/Anxiety/Delirium protocol (if indicated): yes, RASS goal -2 VAP protocol (if indicated): Yes DVT prophylaxis: SCD GI prophylaxis: Pantoprazole for stress ulcer prophylaxis Glucose control: monitor morning labs Mobility: bed rest Code Status: full Family Communication: none bedside, called mother and updated by phone on 1/1 Disposition: remain in ICU  Labs   CBC: Recent Labs  Lab 01/17/18 1128 01/17/18 1136 01/17/18 2040 01/17/18 2354 01/19/18 0707  WBC 22.2*  --  12.3* 12.1* 9.9  NEUTROABS 18.8*  --  9.9*  --   --   HGB 19.0* 20.7* 17.6* 16.3 14.8  HCT 61.3* 61.0* 57.2* 52.1* 44.4  MCV 94.7  --  93.9 95.1 89.9  PLT 270  --  144* 109* 133*    Basic Metabolic Panel: Recent Labs  Lab 01/17/18 1128 01/17/18 1136 01/17/18 2354  NA  144 145 147*  K 3.6 4.7 3.5  CL 110 110 118*  CO2 22  --  18*  GLUCOSE 187* 184* 110*  BUN 12 14 9   CREATININE 1.94* 1.80* 1.50*  CALCIUM 9.1  --  7.6*   GFR: Estimated Creatinine Clearance: 64.4 mL/min (A) (by C-G formula based on SCr of 1.5 mg/dL (H)). Recent Labs  Lab 01/17/18 1127 01/17/18 1128 01/17/18 1553 01/17/18 2040 01/17/18 2354 01/19/18 0707  WBC  --  22.2*  --  12.3* 12.1* 9.9  LATICACIDVEN 4.83*  --  4.3* 2.6* 2.1*  --      Liver Function Tests: Recent Labs  Lab 01/17/18 1128  AST 32  ALT 34  ALKPHOS 72  BILITOT 0.9  PROT 7.9  ALBUMIN 4.4   No results for input(s): LIPASE, AMYLASE in the last 168 hours. No results for input(s): AMMONIA in the last 168 hours.  ABG    Component Value Date/Time   PHART 7.301 (L) 01/18/2018 0557   PCO2ART 37.4 01/18/2018 0557   PO2ART 98.0 01/18/2018 0557   HCO3 18.3 (L) 01/18/2018 0557   TCO2 19 (L) 01/18/2018 0557   ACIDBASEDEF 7.0 (H) 01/18/2018 0557   O2SAT 97.0 01/18/2018 0557     Coagulation Profile: No results for input(s): INR, PROTIME in the last 168 hours.  Cardiac Enzymes: No results for input(s): CKTOTAL, CKMB, CKMBINDEX, TROPONINI in the last 168 hours.  HbA1C: No results found for: HGBA1C  CBG: Recent Labs  Lab 01/17/18 1134 01/17/18 1928  GLUCAP 185* 129*     Critical care time: 40 minutes       Roselie Awkward, MD [place] [facility] Pager: [phone] Cell: [phone] If no response, call [phone]

## 2018-01-19 NOTE — Progress Notes (Signed)
EEG maint complete. Rewrapped head. Continue to monitor

## 2018-01-20 ENCOUNTER — Inpatient Hospital Stay (HOSPITAL_COMMUNITY): Payer: Medicaid Other

## 2018-01-20 ENCOUNTER — Encounter (HOSPITAL_COMMUNITY): Payer: Self-pay | Admitting: Radiology

## 2018-01-20 DIAGNOSIS — J9601 Acute respiratory failure with hypoxia: Secondary | ICD-10-CM

## 2018-01-20 LAB — CULTURE, RESPIRATORY W GRAM STAIN

## 2018-01-20 LAB — BASIC METABOLIC PANEL
Anion gap: 9 (ref 5–15)
Anion gap: 10 (ref 5–15)
BUN: 5 mg/dL — ABNORMAL LOW (ref 6–20)
BUN: 6 mg/dL (ref 6–20)
CHLORIDE: 111 mmol/L (ref 98–111)
CHLORIDE: 112 mmol/L — AB (ref 98–111)
CO2: 26 mmol/L (ref 22–32)
CO2: 24 mmol/L (ref 22–32)
Calcium: 8 mg/dL — ABNORMAL LOW (ref 8.9–10.3)
Calcium: 7.7 mg/dL — ABNORMAL LOW (ref 8.9–10.3)
Creatinine, Ser: 1.07 mg/dL (ref 0.61–1.24)
Creatinine, Ser: 1.16 mg/dL (ref 0.61–1.24)
GFR calc Af Amer: >60 mL/min (ref >60)
GFR calc Af Amer: >60 mL/min (ref >60)
GFR calc non Af Amer: >60 mL/min (ref >60)
GFR calc non Af Amer: >60 mL/min (ref >60)
Glucose, Bld: 139 mg/dL — ABNORMAL HIGH (ref 70–99)
Glucose, Bld: 93 mg/dL (ref 70–99)
POTASSIUM: 4.7 mmol/L (ref 3.5–5.1)
Potassium: 2.5 mmol/L — CL (ref 3.5–5.1)
Sodium: 146 mmol/L — ABNORMAL HIGH (ref 135–145)
Sodium: 146 mmol/L — ABNORMAL HIGH (ref 135–145)

## 2018-01-20 LAB — VDRL, CSF: SYPHILIS VDRL QUANT CSF: NONREACTIVE

## 2018-01-20 LAB — CULTURE, RESPIRATORY

## 2018-01-20 LAB — MAGNESIUM: Magnesium: 1.9 mg/dL (ref 1.7–2.4)

## 2018-01-20 MED ORDER — HYDRALAZINE HCL 20 MG/ML IJ SOLN
10.0000 mg | INTRAMUSCULAR | Status: DC | PRN
  Administered 2018-01-20: 10 mg via INTRAVENOUS
  Filled 2018-01-20: qty 1

## 2018-01-20 MED ORDER — POTASSIUM CHLORIDE 10 MEQ/100ML IV SOLN
10.0000 meq | INTRAVENOUS | Status: DC
  Administered 2018-01-20 (×4): 10 meq via INTRAVENOUS
  Filled 2018-01-20 (×4): qty 100

## 2018-01-20 MED ORDER — HYDRALAZINE HCL 20 MG/ML IJ SOLN
5.0000 mg | INTRAMUSCULAR | Status: DC | PRN
  Administered 2018-01-21 – 2018-01-22 (×2): 5 mg via INTRAVENOUS
  Filled 2018-01-20 (×2): qty 1

## 2018-01-20 MED ORDER — SODIUM CHLORIDE 0.9 % IV SOLN
1.0000 g | INTRAVENOUS | Status: DC
  Filled 2018-01-20: qty 10

## 2018-01-20 MED ORDER — DEXTROSE-NACL 5-0.45 % IV SOLN
INTRAVENOUS | Status: DC
  Administered 2018-01-20 – 2018-01-21 (×2): via INTRAVENOUS

## 2018-01-20 NOTE — Progress Notes (Signed)
EEG discontinued,  leads removed, no skin break down.

## 2018-01-20 NOTE — Progress Notes (Signed)
Pt transported from 3M03 to MRI and back without complications.

## 2018-01-20 NOTE — Progress Notes (Signed)
Neurology Progress Note   S:// Seen and examined Continues to have erratic dyssynchornous breathing over the vent requiring heavy sedation to control. No reported seizures overnight. Overnight EEG with GRDAs, preserved reactuvity and no EEG correlate with abnormal breathing or toe twitch.  O:// Current vital signs: BP 100/60   Pulse 66   Temp (!) 97.5 F (36.4 C)   Resp (!) 27   Ht '5\' 8"'$  (1.727 m)   Wt 84.5 kg   SpO2 98%   BMI 28.33 kg/m  Vital signs in last 24 hours: Temp:  [97.2 F (36.2 C)-99.5 F (37.5 C)] 97.5 F (36.4 C) (01/02 0830) Pulse Rate:  [62-115] 66 (01/02 0830) Resp:  [19-34] 27 (01/02 0830) BP: (87-173)/(54-114) 100/60 (01/02 0830) SpO2:  [91 %-100 %] 98 % (01/02 0830) FiO2 (%):  [40 %] 40 % (01/02 0724) Gen: WD WN NAD HEENT: [place] AT head wrapped in leads for EEG CVS: S1S2+. rrr Resp: off of propofol had a very erratic breathing pattern with tachypnea and short breaths. Ext: warm, well perfused NEUROLOGICAL Sedated intubated Sedation held short duration for exam Started breathing as described above No spont movement Pupils 26m, sluggish, equal Weak corneals Preserved OCR +Gag on suctioning by RT No movement on nox stim in any ext No posturing observed today. Might need repeat exam with lowered sedation   Medications  Current Facility-Administered Medications:  .  0.9 %  sodium chloride infusion, 250 mL, Intravenous, Continuous, KDorie Rank MD .  0.9 %  sodium chloride infusion, 250 mL, Intravenous, Continuous, Aljishi, WVirgina Norfolk MD .  acetaminophen (TYLENOL) tablet 650 mg, 650 mg, Oral, Q4H PRN, Aljishi, Wael Z, MD .  cefTRIAXone (ROCEPHIN) 2 g in sodium chloride 0.9 % 100 mL IVPB, 2 g, Intravenous, Q12H, DAlvira Philips [place]North [place] Stopped at 01/19/18 2151 .  chlorhexidine gluconate (MEDLINE KIT) (PERIDEX) 0.12 % solution 15 mL, 15 mL, Mouth Rinse, BID, Aljishi, WVirgina Norfolk MD, 15 mL at 01/20/18 0733 .  dextrose 5 % and 0.45 % NaCl with KCl 20 mEq/L  infusion, , Intravenous, Continuous, McQuaid, Douglas B, MD, Last Rate: 100 mL/hr at 01/20/18 0800 .  heparin injection 5,000 Units, 5,000 Units, Subcutaneous, Q8H, MHarvel Quale [facility] 5,000 Units at 01/20/18 05974.  hydrALAZINE (APRESOLINE) injection 5 mg, 5 mg, Intravenous, Q4H PRN, GVelna Ochs MD .  levETIRAcetam (KEPPRA) IVPB 1000 mg/100 mL premix, 1,000 mg, Intravenous, Q12H, MJuanito Doom MD, Stopped at 01/19/18 2120 .  MEDLINE mouth rinse, 15 mL, Mouth Rinse, 10 times per day, AAldean Jewett MD, 15 mL at 01/20/18 0509 .  midazolam (VERSED) injection 2 mg, 2 mg, Intravenous, Q15 min PRN, Deterding, EGuadelupe Sabin MD, 2 mg at 01/18/18 0947 .  midazolam (VERSED) injection 2 mg, 2 mg, Intravenous, Q2H PRN, Deterding, EGuadelupe Sabin MD, 2 mg at 01/20/18 0342 .  norepinephrine (LEVOPHED) '4mg'$  in D5W 2560mpremix infusion, 2-10 mcg/min, Intravenous, Titrated, KnDorie RankMD, Stopped at 01/17/18 2053 .  pantoprazole (PROTONIX) injection 40 mg, 40 mg, Intravenous, Q24H, Desai, Rahul P, PA-C, 40 mg at 01/20/18 0731 .  potassium chloride 10 mEq in 100 mL IVPB, 10 mEq, Intravenous, Q1 Hr x 6, Guilloud, Carolyn, MD .  propofol (DIPRIVAN) 1000 MG/100ML infusion, 5-80 mcg/kg/min, Intravenous, Titrated, McQuaid, Douglas B, MD, Last Rate: 25.4 mL/hr at 01/20/18 0800, 50 mcg/kg/min at 01/20/18 0800 .  valproate (DEPACON) 500 mg in dextrose 5 % 50 mL IVPB, 500 mg, Intravenous, Q8H, KiGreta DoomMD, Stopped at 01/20/18 [id number]  CBC    Component Value Date/Time   WBC 9.9 01/19/2018 0707   RBC 4.94 01/19/2018 0707   HGB 14.8 01/19/2018 0707   HCT 44.4 01/19/2018 0707   PLT 133 (L) 01/19/2018 0707   MCV 89.9 01/19/2018 0707   MCH 30.0 01/19/2018 0707   MCHC 33.3 01/19/2018 0707   RDW 13.7 01/19/2018 0707   LYMPHSABS 1.6 01/17/2018 2040   MONOABS 0.6 01/17/2018 2040   EOSABS 0.1 01/17/2018 2040   BASOSABS 0.0 01/17/2018 2040    CMP     Component Value Date/Time   NA 146 (H)  01/20/2018 0649   K 2.5 (LL) 01/20/2018 0649   CL 111 01/20/2018 0649   CO2 26 01/20/2018 0649   GLUCOSE 139 (H) 01/20/2018 0649   BUN 5 (L) 01/20/2018 0649   CREATININE 1.07 01/20/2018 0649   CREATININE 1.22 08/06/2017 1619   CALCIUM 8.0 (L) 01/20/2018 0649   PROT 7.9 01/17/2018 1128   ALBUMIN 4.4 01/17/2018 1128   AST 32 01/17/2018 1128   ALT 34 01/17/2018 1128   ALKPHOS 72 01/17/2018 1128   BILITOT 0.9 01/17/2018 1128   GFRNONAA >60 01/20/2018 0649   GFRAA >60 01/20/2018 0649    glycosylated hemoglobin  Lipid Panel     Component Value Date/Time   TRIG 283 (H) 01/18/2018 1525     Imaging I have reviewed images in epic and the results pertinent to this consultation are: CTH with no acute changes  Assessment:  47/M with new onset seizure, LP negative for infection, and EEG with GRDAs and findings probably suggestive of burn out status epilepticus. Exam has been variable but marred by sedation that has been essential to maintain vent synchrony. He was at 2 days ago withdrawing to nox stim, and yesterday had extensor posturing in both upper extremities to nox stim while today there was minimal if any movement to nox stim. EEG continues to show GRDAs with preserved reactivity and some sleep architecture.  Differentials include anoxic/hypoxic brain injury vs. Toxic brain insult from illicit drug use vs. Sequale of prolonged seizures.  Recommendations: Continue with Keppra and Depakote Propofol as needed for respiratory management Discontinue EEG and get a MRI of brain for more information on any evidence of structural damage. We will continue to follow with you.  -- Amie Portland, MD [facility] Pager: [phone] If 7pm to 7am, please call on call as listed on AMION.  CRITICAL CARE ATTESTATION Performed by: Amie Portland, MD Total critical care time:30  minutes Critical care time was exclusive of separately billable procedures and treating other patients  and/or supervising APPs/Residents/Students Critical care was necessary to treat or prevent imminent or life-threatening deterioration due to coma, status epilepticus, possible anoxic brain insult  This patient is critically ill and at significant risk for neurological worsening and/or death and care requires constant monitoring. Critical care was time spent personally by me on the following activities: development of treatment plan with patient and/or surrogate as well as nursing, discussions with consultants, evaluation of patient's response to treatment, examination of patient, obtaining history from patient or surrogate, ordering and performing treatments and interventions, ordering and review of laboratory studies, ordering and review of radiographic studies, pulse oximetry, re-evaluation of patient's condition, participation in multidisciplinary rounds and medical decision making of high complexity in the care of this patient.

## 2018-01-20 NOTE — Progress Notes (Addendum)
NAME:  Benjamin [name], MRN:  [id number], DOB:  [birth year], LOS: 3 ADMISSION DATE:  01/17/2018, CONSULTATION DATE:  01/17/2018 REFERRING MD:  EDP, CHIEF COMPLAINT:  Confusion   Brief History   48 y/o male with polysubstance abuse admitted with acute encephalopathy and seizure, intubated in ER for airway protection.  Past Medical History  Polysubstance abuse, anxiety, chronic back pain, diveritulitis, GERD, Solitary kidney, spinal stenosis  Significant Hospital Events   12/30 admission, intubation 12/31 worrisome findings for recent status epilepticus, continuous EEG monitoring arranged, started propofol  Consults:  Neurology  Procedures:  12/30 > intubated 12/31 lumbar puncture> no pleocytosis  Significant Diagnostic Tests:  CT head 12/30>Multifocal paranasal sinus disease with patient intubated. Brain parenchyma appears unremarkable. No mass or hemorrhage. EEG 12/31> worrisome for burst suppression, recent status epilepticus MRI Brain 1/2> pending  Micro Data:  Blood12/30 >NG x 2 days Sputum 12/30> pending CSF 12/31 > NG  Antimicrobials:  12/30 Ceftriaxone >  Interim history/subjective:  Sedated, unresponsive on vent  Objective   Blood pressure 106/73, pulse 75, temperature 97.7 F (36.5 C), resp. rate (!) 27, height 5\' 8"  (1.727 m), weight 84.5 kg, SpO2 98 %.    Vent Mode: PRVC FiO2 (%):  [40 %] 40 % Set Rate:  [27 bmp] 27 bmp Vt Set:  [410 mL] 410 mL PEEP:  [5 cmH20] 5 cmH20 Plateau Pressure:  [11 IOM35-59 cmH20] 11 cmH20   Intake/Output Summary (Last 24 hours) at 01/20/2018 0725 Last data filed at 01/20/2018 0600 Gross per 24 hour  Intake 4577.83 ml  Output 3085 ml  Net 1492.83 ml   Filed Weights   01/17/18 1130 01/18/18 0500 01/19/18 0413  Weight: 93 kg 84.5 kg 84.5 kg    Examination: General: Unresponsive on vent HENT: NCAT, ETT in place Lungs: CTAB, on ventilator  Cardiovascular: rrr, no m/r/g Abdomen: Soft, hypoactive BS Extremities: No  edema, well perfused Neuro: Sedated, on responsive. Pupils 37mm and sluggish. No withdrawal to noxious stimuli   Resolved Hospital Problem list     Assessment & Plan:   Acute encephalopathy, seizure: No sign of neurologic recover thus far, however also on sedation. Concern for anoxic/hypoxic brain injury.  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 -- LP 12/30 without pleocytosis; culture NGTD -- MRI brain today  Acute Respiratory Failure: -- Full vent support -- Propofol for sedation  -- VAP prevention -- Pulm toilette  Severe Sepsis due to Pneumonia in RLL: -- Ceftriaxone day 4 -- CXR today with stable / improving infiltrate  -- Sputum cx pending   Hypokalemia:  -- Checking mag  -- Continue D5 1/2 NS +KCL -- 10 mEq IV KCL x 6  -- Repeat afternoon BMP  Elevated BPs: Hypertensive yesterday to 170/101, improved with IV hydral but subsequently hypotensive this morning  -- Tele -- Decrease prn IV hydral to 5 mg q4h  AKI: Resolved -- I&Os -- Daily BMP  Polysubstance Abuse: -- UDS positive for opiates, cocaine, and THC -- Monitor for withdrawal   Best practice:  Diet: NPO Pain/Anxiety/Delirium protocol (if indicated): Versed, propofol, RASS goal -2 VAP protocol (if indicated): yes DVT prophylaxis: SCDs GI prophylaxis: IV Protonix 40 mg q24 hours Glucose control: Daily labs Mobility: Strict bed rest Code Status: FULL Family Communication: None at bedside Disposition: Remain in ICU  Labs   CBC: Recent Labs  Lab 01/17/18 1128 01/17/18 1136 01/17/18 2040 01/17/18 2354 01/19/18 0707  WBC  22.2*  --  12.3* 12.1* 9.9  NEUTROABS 18.8*  --  9.9*  --   --   HGB 19.0* 20.7* 17.6* 16.3 14.8  HCT 61.3* 61.0* 57.2* 52.1* 44.4  MCV 94.7  --  93.9 95.1 89.9  PLT 270  --  144* 109* 133*    Basic Metabolic Panel: Recent Labs  Lab 01/17/18 1128 01/17/18 1136  01/17/18 2354 01/19/18 0707 01/19/18 1145  NA 144 145 147* 145 146*  K 3.6 4.7 3.5 2.6* 2.6*  CL 110 110 118* 109 110  CO2 22  --  18* 28 23  GLUCOSE 187* 184* 110* 103* 116*  BUN 12 14 9 11 14   CREATININE 1.94* 1.80* 1.50* 1.31* 1.21  CALCIUM 9.1  --  7.6* 8.8* 8.4*  MG  --   --   --  2.1  --   PHOS  --   --   --  1.2*  --    GFR: Estimated Creatinine Clearance: 79.8 mL/min (by C-G formula based on SCr of 1.21 mg/dL). Recent Labs  Lab 01/17/18 1127 01/17/18 1128 01/17/18 1553 01/17/18 2040 01/17/18 2354 01/19/18 0707  WBC  --  22.2*  --  12.3* 12.1* 9.9  LATICACIDVEN 4.83*  --  4.3* 2.6* 2.1*  --     Liver Function Tests: Recent Labs  Lab 01/17/18 1128  AST 32  ALT 34  ALKPHOS 72  BILITOT 0.9  PROT 7.9  ALBUMIN 4.4   No results for input(s): LIPASE, AMYLASE in the last 168 hours. Recent Labs  Lab 01/19/18 1145  AMMONIA 45*    ABG    Component Value Date/Time   PHART 7.301 (L) 01/18/2018 0557   PCO2ART 37.4 01/18/2018 0557   PO2ART 98.0 01/18/2018 0557   HCO3 18.3 (L) 01/18/2018 0557   TCO2 19 (L) 01/18/2018 0557   ACIDBASEDEF 7.0 (H) 01/18/2018 0557   O2SAT 97.0 01/18/2018 0557     Coagulation Profile: No results for input(s): INR, PROTIME in the last 168 hours.  Cardiac Enzymes: No results for input(s): CKTOTAL, CKMB, CKMBINDEX, TROPONINI in the last 168 hours.  HbA1C: No results found for: HGBA1C  CBG: Recent Labs  Lab 01/17/18 1134 01/17/18 1928 01/19/18 2336  GLUCAP 185* 129* 112*    Past Medical History  He,  has a past medical history of Acute stomach ulcer, Anxiety, Arthritis, Broken back (1980's), Chronic back pain, Depression, Diverticulitis of duodenum, Duodenal diverticulum, GERD (gastroesophageal reflux disease), H/O clavicle fracture, Headache(784.0), IBS (irritable bowel syndrome), Solitary kidney, congenital, and Spinal stenosis.   Surgical History    Past Surgical History:  Procedure Laterality Date  . COLONOSCOPY  N/A 05/16/2012   AQT:MAUQJ polyp-tubular adenoma. TI 10cm normal. Next TCS 04/2017.  Marland Kitchen COLONOSCOPY WITH PROPOFOL N/A 11/01/2017   Procedure: COLONOSCOPY WITH PROPOFOL;  Surgeon: Daneil Dolin, MD;  Location: [facility];  Service: Endoscopy;  Laterality: N/A;  10:00am  . ESOPHAGOGASTRODUODENOSCOPY N/A 09/29/2012   FHL:KTGYBW ulcerative/erosive reflux esophagitis. Hiatal hernia. Multiple duodenal bulbar ulcers. Negative H.pylori serology  . ESOPHAGOGASTRODUODENOSCOPY N/A 06/02/2013   Dr. Gala Romney: Mild erosive reflux esophagitis  . ESOPHAGOGASTRODUODENOSCOPY (EGD) WITH PROPOFOL N/A 11/01/2017   Procedure: ESOPHAGOGASTRODUODENOSCOPY (EGD) WITH PROPOFOL;  Surgeon: Daneil Dolin, MD;  Location: [facility];  Service: Endoscopy;  Laterality: N/A;  . POLYPECTOMY  11/01/2017   Procedure: POLYPECTOMY;  Surgeon: Daneil Dolin, MD;  Location: [facility];  Service: Endoscopy;;  colon     Social History   reports that he has  been smoking cigarettes. He has a 30.00 pack-year smoking history. He has never used smokeless tobacco. He reports that he does not drink alcohol or use drugs.   Family History   His family history includes Diabetes in his mother; Heart failure in his mother; Hypertension in his mother. There is no history of Colon cancer.   Allergies No Known Allergies   Home Medications  Prior to Admission medications   Medication Sig Start Date End Date Taking? Authorizing Provider  buprenorphine (BUTRANS) 5 MCG/HR PTWK patch Place 5 mcg onto the skin once a week.   Yes [provider]  busPIRone (BUSPAR) 10 MG tablet Take 10 mg by mouth 3 (three) times daily.   Yes [provider]  CARAFATE 1 GM/10ML suspension TAKE 10 MLS BY MOUTH FOUR TIMES DAILY. Patient taking differently: Take 1 g by mouth 4 (four) times daily.  10/12/17  Yes Carlis Stable, NP  citalopram (CELEXA) 20 MG tablet Take 20 mg by mouth daily.   Yes [provider]  DEXILANT 60 MG capsule TAKE  ONE CAPSULE BY MOUTH ONCE DAILY. Patient taking differently: Take 60 mg by mouth daily.  11/24/17  Yes Carlis Stable, NP  DULoxetine (CYMBALTA) 30 MG capsule Take 30 mg by mouth daily. Take 1 capsule twice daily   Yes [provider]  haloperidol (HALDOL) 5 MG tablet Take 5 mg by mouth at bedtime.   Yes [provider]  traZODone (DESYREL) 50 MG tablet Take 50-100 mg by mouth at bedtime as needed for sleep.    Yes [provider]  albuterol (PROVENTIL HFA;VENTOLIN HFA) 108 (90 Base) MCG/ACT inhaler Inhale 3 puffs into the lungs every 4 (four) hours as needed for wheezing or shortness of breath.     [provider]  Menthol, Topical Analgesic, (ABSORBINE PAIN RELIEVING EX) Apply 1 patch topically daily as needed (for pain).    [provider]     Critical care time:      Velna Ochs, M.D. - PGY3 Pager: [phone] 01/20/2018, 9:19 AM

## 2018-01-20 NOTE — Progress Notes (Signed)
Pt switched from cpap/pcv to full support.  Some increased WOB noted and Sp02 dropped to the low 80's. sp02 increased to 100% on full support. No distress noted  Will continue to monitor.

## 2018-01-20 NOTE — Progress Notes (Signed)
[facility] Progress Note Patient Name: MODESTO GANOE DOB: [birth year] MRN: [id number]   Date of Service  01/20/2018  HPI/Events of Note  Hypertension - BP = 170/101.   eICU Interventions  Will order: 1. Hydralazine 10 mg IV Q 4 hours PRN SBP > 170 or DBP > 100.     Intervention Category Major Interventions: Hypertension - evaluation and management  Sommer,Steven Eugene 01/20/2018, 1:44 AM

## 2018-01-20 NOTE — Procedures (Signed)
LTM-EEG Report  HISTORY: Continuous video-EEG monitoring performed for74year old with possible seizures and abnormal EEG. ACQUISITION: International 10-20 system for electrode placement; 18 channels with additional eyes linked to ipsilateral ears and EKG. Additional T1-T2 electrodes were used. Continuous video recording obtained.   EEG NUMBER:  MEDICATIONS:  Day2:see EMR  DAY #2: [id number] 1/1/19to0730 01/20/18  BACKGROUND: An overallmedium voltage slow record with some reactivity. The burst suppression pattern from the prior day has resolved. There was frequent generalized background slowing (delta and some theta, sparse faster frequencies) with runs of GRDA described below. Some state changes were observed with sleep spindles.  EPILEPTIFORM/PERIODIC ACTIVITY:Runs of GRDA with arousal, 1-2Hz , without sharp contours or epileptiform appearance.  SEIZURES:no EVENTS:Two events of abnormal respirations and limb twitching were reported without change to the patient's arousal EEG pattern. These did not appear to be seizures.  EKG: no significant arrhythmia  SUMMARY: This wasan abnormal continuous video EEG due to generalized background slowing and runs of GRDA. Reactivity was preserved. Events of abnormal breathing with limb twitching did not appear to be seizures.

## 2018-01-21 ENCOUNTER — Inpatient Hospital Stay (HOSPITAL_COMMUNITY): Payer: Medicaid Other

## 2018-01-21 DIAGNOSIS — J019 Acute sinusitis, unspecified: Secondary | ICD-10-CM

## 2018-01-21 LAB — CBC
HCT: 41.8 % (ref 39.0–52.0)
Hemoglobin: 13.9 g/dL (ref 13.0–17.0)
MCH: 30.2 pg (ref 26.0–34.0)
MCHC: 33.3 g/dL (ref 30.0–36.0)
MCV: 90.7 fL (ref 80.0–100.0)
Platelets: 150 10*3/uL (ref 150–400)
RBC: 4.61 MIL/uL (ref 4.22–5.81)
RDW: 13.9 % (ref 11.5–15.5)
WBC: 6.9 10*3/uL (ref 4.0–10.5)
nRBC: 0 % (ref 0.0–0.2)

## 2018-01-21 LAB — VALPROIC ACID LEVEL: Valproic Acid Lvl: 61 ug/mL (ref 50.0–100.0)

## 2018-01-21 LAB — BASIC METABOLIC PANEL
Anion gap: 10 (ref 5–15)
Anion gap: 9 (ref 5–15)
BUN: <5 mg/dL — ABNORMAL LOW (ref 6–20)
BUN: <5 mg/dL — ABNORMAL LOW (ref 6–20)
CHLORIDE: 112 mmol/L — AB (ref 98–111)
CO2: 27 mmol/L (ref 22–32)
CO2: 24 mmol/L (ref 22–32)
CREATININE: 1.04 mg/dL (ref 0.61–1.24)
Calcium: 7.9 mg/dL — ABNORMAL LOW (ref 8.9–10.3)
Calcium: 8.2 mg/dL — ABNORMAL LOW (ref 8.9–10.3)
Chloride: 109 mmol/L (ref 98–111)
Creatinine, Ser: 1 mg/dL (ref 0.61–1.24)
GFR calc Af Amer: >60 mL/min (ref >60)
GFR calc non Af Amer: >60 mL/min (ref >60)
GFR calc non Af Amer: >60 mL/min (ref >60)
Glucose, Bld: 108 mg/dL — ABNORMAL HIGH (ref 70–99)
Glucose, Bld: 125 mg/dL — ABNORMAL HIGH (ref 70–99)
Potassium: 2.6 mmol/L — CL (ref 3.5–5.1)
Potassium: 3.5 mmol/L (ref 3.5–5.1)
Sodium: 146 mmol/L — ABNORMAL HIGH (ref 135–145)
Sodium: 145 mmol/L (ref 135–145)

## 2018-01-21 LAB — PHOSPHORUS
Phosphorus: 2.7 mg/dL (ref 2.5–4.6)
Phosphorus: 2.3 mg/dL — ABNORMAL LOW (ref 2.5–4.6)

## 2018-01-21 LAB — CSF CULTURE W GRAM STAIN: Culture: NO GROWTH

## 2018-01-21 LAB — AMMONIA: Ammonia: 57 umol/L — ABNORMAL HIGH (ref 9–35)

## 2018-01-21 LAB — GLUCOSE, CAPILLARY
GLUCOSE-CAPILLARY: 111 mg/dL — AB (ref 70–99)
Glucose-Capillary: 109 mg/dL — ABNORMAL HIGH (ref 70–99)
Glucose-Capillary: 111 mg/dL — ABNORMAL HIGH (ref 70–99)

## 2018-01-21 LAB — TRIGLYCERIDES: TRIGLYCERIDES: 200 mg/dL — AB (ref <150)

## 2018-01-21 LAB — MAGNESIUM
MAGNESIUM: 1.8 mg/dL (ref 1.7–2.4)
MAGNESIUM: 2.4 mg/dL (ref 1.7–2.4)
Magnesium: 2.5 mg/dL — ABNORMAL HIGH (ref 1.7–2.4)

## 2018-01-21 MED ORDER — PROPOFOL 1000 MG/100ML IV EMUL
INTRAVENOUS | Status: AC
  Administered 2018-01-21: 10 ug/kg/min via INTRAVENOUS
  Filled 2018-01-21: qty 100

## 2018-01-21 MED ORDER — VITAL AF 1.2 CAL PO LIQD
1000.0000 mL | ORAL | Status: DC
  Administered 2018-01-21: 1000 mL

## 2018-01-21 MED ORDER — PROPOFOL 500 MG/50ML IV EMUL
INTRAVENOUS | Status: AC
  Filled 2018-01-21: qty 50

## 2018-01-21 MED ORDER — CEFAZOLIN SODIUM-DEXTROSE 2-4 GM/100ML-% IV SOLN
2.0000 g | Freq: Three times a day (TID) | INTRAVENOUS | Status: DC
  Administered 2018-01-21 – 2018-01-24 (×9): 2 g via INTRAVENOUS
  Filled 2018-01-21 (×10): qty 100

## 2018-01-21 MED ORDER — POTASSIUM CHLORIDE 20 MEQ PO PACK
20.0000 meq | PACK | Freq: Two times a day (BID) | ORAL | Status: DC
  Administered 2018-01-21 – 2018-01-22 (×3): 20 meq
  Filled 2018-01-21 (×3): qty 1

## 2018-01-21 MED ORDER — PANTOPRAZOLE SODIUM 40 MG PO PACK
40.0000 mg | PACK | Freq: Every day | ORAL | Status: DC
  Administered 2018-01-22 – 2018-01-29 (×5): 40 mg
  Filled 2018-01-21 (×9): qty 20

## 2018-01-21 MED ORDER — SENNOSIDES-DOCUSATE SODIUM 8.6-50 MG PO TABS
1.0000 | ORAL_TABLET | Freq: Two times a day (BID) | ORAL | Status: DC
  Administered 2018-01-21 – 2018-02-02 (×17): 1
  Filled 2018-01-21 (×20): qty 1

## 2018-01-21 MED ORDER — POTASSIUM CHLORIDE 10 MEQ/100ML IV SOLN
10.0000 meq | INTRAVENOUS | Status: AC
  Administered 2018-01-21 (×6): 10 meq via INTRAVENOUS
  Filled 2018-01-21 (×6): qty 100

## 2018-01-21 MED ORDER — VITAL HIGH PROTEIN PO LIQD: 1000.0000 mL | ORAL | Status: DC

## 2018-01-21 MED ORDER — PRO-STAT SUGAR FREE PO LIQD
30.0000 mL | Freq: Two times a day (BID) | ORAL | Status: DC
  Administered 2018-01-21 – 2018-01-22 (×2): 30 mL
  Filled 2018-01-21 (×2): qty 30

## 2018-01-21 MED ORDER — PROPOFOL 1000 MG/100ML IV EMUL
5.0000 ug/kg/min | INTRAVENOUS | Status: DC
  Administered 2018-01-21 (×2): 40 ug/kg/min via INTRAVENOUS
  Administered 2018-01-21: 10 ug/kg/min via INTRAVENOUS
  Administered 2018-01-22: 40 ug/kg/min via INTRAVENOUS
  Administered 2018-01-22: 35 ug/kg/min via INTRAVENOUS
  Filled 2018-01-21 (×4): qty 100

## 2018-01-21 MED ORDER — FREE WATER
200.0000 mL | Status: DC
  Administered 2018-01-21 – 2018-01-22 (×7): 200 mL

## 2018-01-21 MED ORDER — MAGNESIUM SULFATE 2 GM/50ML IV SOLN
2.0000 g | Freq: Once | INTRAVENOUS | Status: AC
  Administered 2018-01-21: 2 g via INTRAVENOUS
  Filled 2018-01-21: qty 50

## 2018-01-21 MED ORDER — KCL IN DEXTROSE-NACL 40-5-0.45 MEQ/L-%-% IV SOLN
INTRAVENOUS | Status: DC
  Administered 2018-01-21: 12:00:00 via INTRAVENOUS
  Filled 2018-01-21: qty 1000

## 2018-01-21 MED ORDER — POTASSIUM PHOSPHATES 15 MMOLE/5ML IV SOLN
10.0000 mmol | Freq: Once | INTRAVENOUS | Status: AC
  Administered 2018-01-21: 10 mmol via INTRAVENOUS
  Filled 2018-01-21: qty 3.33

## 2018-01-21 NOTE — Progress Notes (Signed)
Nutrition Follow-up  DOCUMENTATION CODES:   Not applicable  INTERVENTION:   Tube Feeding:  Begin Vital AF 1.2 @ 20, goal rate of 60 ml/hr Pro-Stat 30 mL BID Provides 138 g of protein, 1928 kcals, 1166 mL of free water   NUTRITION DIAGNOSIS:   Inadequate oral intake related to acute illness as evidenced by NPO status.  Being addressed via TF   GOAL:   Patient will meet greater than or equal to 90% of their needs  Progressing  MONITOR:   Vent status, TF tolerance, Labs, Weight trends  REASON FOR ASSESSMENT:   Ventilator    ASSESSMENT:    48 yo male admitted with AMS and new onset seizures requiring intubation, concern for meningitis/encephaliti. +cocaine, THC, opiates. PMH includes spinal stenosis, diverticulitis, gastric ulcer, IBS   NPO day 4  Patient is currently intubated on ventilator support MV: 9.7 L/min Temp (24hrs), Avg:98.2 F (36.8 C), Min:97.5 F (36.4 C), Max:98.8 F (37.1 C)  Propofol: OFF  NG to LIS with 300 mL today Abdomen soft, distended. BS hypoactive   Receiving KCl for hypokalemia; phosphorus 1.2 on 1/01. Discussed with MD, plan to order supplementation today  Labs: sodium 146, potassium 2.6 (L), phosphorus 1.2 (L) Meds: reviewed   Diet Order:   Diet Order            Diet NPO time specified  Diet effective now              EDUCATION NEEDS:   Not appropriate for education at this time  Skin:  Skin Assessment: Reviewed RN Assessment  Last BM:  12/31  Height:   Ht Readings from Last 1 Encounters:  01/17/18 5\' 8"  (1.727 m)    Weight:   Wt Readings from Last 1 Encounters:  01/21/18 86.9 kg    Ideal Body Weight:  70 kg  BMI:  Body mass index is 29.13 kg/m.  Estimated Nutritional Needs:   Kcal:  1914 kcals   Protein:  127-169 g   Fluid:  >/= 2 L   Kerman Passey MS, RD, LDN, CNSC [phone] Pager  [phone] Weekend/On-Call Pager

## 2018-01-21 NOTE — Progress Notes (Signed)
CRITICAL VALUE ALERT  Critical Value:  2.6 Potassium   Date & Time Notied:  01/21/18 10:37  Provider Notified: CCM Resident notified   Orders Received/Actions taken: Awaiting new orders, will continue to monitor

## 2018-01-21 NOTE — Progress Notes (Addendum)
Neurology Progress Note   S:// No acute events overnight.  Continues to become dyssynchronous with the vent if sedation is lowered.  Currently on propofol at 40 an hour   O:// Current vital signs: BP 136/81 (BP Location: Right Arm)   Pulse 67   Temp 98.2 F (36.8 C)   Resp 20   Ht 5' 8" (1.727 m)   Wt 86.9 kg   SpO2 99%   BMI 29.13 kg/m  Vital signs in last 24 hours: Temp:  [96.3 F (35.7 C)-98.8 F (37.1 C)] 98.2 F (36.8 C) (01/03 0800) Pulse Rate:  [54-96] 67 (01/03 0800) Resp:  [17-29] 20 (01/03 0800) BP: (85-161)/(62-105) 136/81 (01/03 0800) SpO2:  [93 %-100 %] 99 % (01/03 0800) FiO2 (%):  [40 %] 40 % (01/03 0800) Weight:  [86.9 kg] 86.9 kg (01/03 0500) General: Sedated intubated.  Sedation held for exam. HEENT: Normocephalic atraumatic Neck: Clear to auscultation Cardiovascular: S1 is heard regular rate rhythm Respiratory: Vented, no rales Abdomen: Nondistended nontender Extremities: Right upper extremity appears tense but warm.  Left upper extremity right lower extremity and left lower extremity mildly edematous but warm, appears well perfused. Neurological exam Sedation was held for the duration of the exam.  He is on propofol 15-hour He is intubated After about 10 minutes of holding off of propofol, he started to open eyes spontaneously with a gaze to the right which then started roving to the left and then back to the right.  Slow roving eye movements appreciated throughout the exam.  His pupils are equal round reactive to light.  He has a very weak gag.  He has positive corneals. He spontaneously moving his left upper extremity, left lower extremity and right lower extremity.  He does not have good response to pain but spontaneously moves at least 3 out of 4 extremities.  Medications  Current Facility-Administered Medications:  .  0.9 %  sodium chloride infusion, 250 mL, Intravenous, Continuous, Knapp, Jon, MD .  0.9 %  sodium chloride infusion, 250 mL,  Intravenous, Continuous, Aljishi, Wael Z, MD .  acetaminophen (TYLENOL) tablet 650 mg, 650 mg, Oral, Q4H PRN, Aljishi, Wael Z, MD .  ceFAZolin (ANCEF) IVPB 2g/100 mL premix, 2 g, Intravenous, Q8H, Guilloud, Carolyn, MD .  chlorhexidine gluconate (MEDLINE KIT) (PERIDEX) 0.12 % solution 15 mL, 15 mL, Mouth Rinse, BID, Aljishi, Wael Z, MD, 15 mL at 01/21/18 0835 .  dextrose 5 %-0.45 % sodium chloride infusion, , Intravenous, Continuous, Guilloud, Carolyn, MD, Last Rate: 100 mL/hr at 01/21/18 0800 .  heparin injection 5,000 Units, 5,000 Units, Subcutaneous, Q8H, Masters, Alison M, RPH, 5,000 Units at 01/21/18 0527 .  hydrALAZINE (APRESOLINE) injection 5 mg, 5 mg, Intravenous, Q4H PRN, Guilloud, Carolyn, MD .  levETIRAcetam (KEPPRA) IVPB 1000 mg/100 mL premix, 1,000 mg, Intravenous, Q12H, McQuaid, Douglas B, MD, Last Rate: 400 mL/hr at 01/21/18 1029, 1,000 mg at 01/21/18 1029 .  MEDLINE mouth rinse, 15 mL, Mouth Rinse, 10 times per day, Aljishi, Wael Z, MD, 15 mL at 01/21/18 0527 .  midazolam (VERSED) injection 2 mg, 2 mg, Intravenous, Q15 min PRN, Deterding, Elizabeth C, MD, 2 mg at 01/18/18 0947 .  midazolam (VERSED) injection 2 mg, 2 mg, Intravenous, Q2H PRN, Deterding, Elizabeth C, MD, 2 mg at 01/20/18 1750 .  norepinephrine (LEVOPHED) 4mg in D5W 250mL premix infusion, 2-10 mcg/min, Intravenous, Titrated, Knapp, Jon, MD, Stopped at 01/17/18 2053 .  pantoprazole (PROTONIX) injection 40 mg, 40 mg, Intravenous, Q24H, Desai, Rahul P, PA-C, 40 mg   at 01/21/18 0836 .  propofol (DIPRIVAN) 1000 MG/100ML infusion, 5-80 mcg/kg/min, Intravenous, Titrated, McQuaid, Nathaneil Canary B, MD, Last Rate: 20.3 mL/hr at 01/21/18 0837, 40 mcg/kg/min at 01/21/18 0837 .  valproate (DEPACON) 500 mg in dextrose 5 % 50 mL IVPB, 500 mg, Intravenous, Q8H, Greta Doom, MD, Stopped at 01/21/18 8832 Labs CBC    Component Value Date/Time   WBC 9.9 01/19/2018 0707   RBC 4.94 01/19/2018 0707   HGB 14.8 01/19/2018 0707   HCT  44.4 01/19/2018 0707   PLT 133 (L) 01/19/2018 0707   MCV 89.9 01/19/2018 0707   MCH 30.0 01/19/2018 0707   MCHC 33.3 01/19/2018 0707   RDW 13.7 01/19/2018 0707   LYMPHSABS 1.6 01/17/2018 2040   MONOABS 0.6 01/17/2018 2040   EOSABS 0.1 01/17/2018 2040   BASOSABS 0.0 01/17/2018 2040    CMP     Component Value Date/Time   NA 146 (H) 01/20/2018 1427   K 4.7 01/20/2018 1427   CL 112 (H) 01/20/2018 1427   CO2 24 01/20/2018 1427   GLUCOSE 93 01/20/2018 1427   BUN 6 01/20/2018 1427   CREATININE 1.16 01/20/2018 1427   CREATININE 1.22 08/06/2017 1619   CALCIUM 7.7 (L) 01/20/2018 1427   PROT 7.9 01/17/2018 1128   ALBUMIN 4.4 01/17/2018 1128   AST 32 01/17/2018 1128   ALT 34 01/17/2018 1128   ALKPHOS 72 01/17/2018 1128   BILITOT 0.9 01/17/2018 1128   GFRNONAA >60 01/20/2018 1427   GFRAA >60 01/20/2018 1427  Imaging I have reviewed images in epic and the results pertinent to this consultation are: MRI of the brain was done and showed no evidence of anoxic injury  Assessment:  48 year old man with past medical history of polysubstance abuse, presented with new onset seizure.  LP was done which was negative for infection. 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 He has been very agitated and exam has been variable but marred by sedation that has been essential to maintain his vent synchronization. Off of sedation, today he has spontaneous movement at least in 3-4 extremities and open his eyes spontaneously. His EEG last discontinued yesterday continued to show the generalized rhythmic delta activity with preserved reactivity and some sleep architecture. MRI of the brain was done overnight and did not reveal any evidence of anoxic injury. 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 Given  that the MRI did not show any anoxic injury, I would expect that he might show some improvement over the next few days to weeks.  Recommendations: Attempt to lower sedation-can try Precedex instead of the propofol-if it agrees with his vent management. Continue with Keppra and Depakote at current doses. Check Depakote level before next dose Check ammonia level Continue to follow with you.  -- Amie Portland, MD [facility] Pager: [phone] If 7pm to 7am, please call on call as listed on AMION.  CRITICAL CARE ATTESTATION Performed by: Amie Portland, MD Total critical care time: 35 minutes Critical care time was exclusive of separately billable procedures and treating other patients and/or supervising APPs/Residents/Students Critical care was necessary to treat or prevent imminent or life-threatening deterioration due to toxic metabolic encephalopathy, status epilepticus This patient is critically ill and at significant risk for neurological worsening and/or death and care requires constant monitoring. Critical care was time spent personally by  me on the following activities: development of treatment plan with patient and/or surrogate as well as nursing, discussions with consultants, evaluation of patient's response to treatment, examination of patient, obtaining history from patient or surrogate, ordering and performing treatments and interventions, ordering and review of laboratory studies, ordering and review of radiographic studies, pulse oximetry, re-evaluation of patient's condition, participation in multidisciplinary rounds and medical decision making of high complexity in the care of this patient.

## 2018-01-21 NOTE — Progress Notes (Addendum)
NAME:  Benjamin [name], MRN:  [id number], DOB:  [birth year], LOS: 4 ADMISSION DATE:  01/17/2018, CONSULTATION DATE:  01/17/2018 REFERRING MD:  EDP, CHIEF COMPLAINT:  Confusion   Brief History   48 y/o male with polysubstance abuse admitted with acute encephalopathy and seizure, intubated in ER for airway protection.  Past Medical History  Polysubstance abuse, anxiety, chronic back pain, diveritulitis, GERD, Solitary kidney, spinal stenosis  Significant Hospital Events   12/30 admission, intubation 12/31 worrisome findings for recent status epilepticus, continuous EEG monitoring arranged, started propofol  Consults:  Neurology  Procedures:  12/30 > intubated 12/31 lumbar puncture> no pleocytosis  Significant Diagnostic Tests:  CT head 12/30>Multifocal paranasal sinus disease with patient intubated. Brain parenchyma appears unremarkable. No mass or hemorrhage. EEG 12/31> worrisome for burst suppression, recent status epilepticus MRI Brain 1/2> No acute abnormality; no evidence of hypoxic ischemic injury  Micro Data:  Blood12/30 >NG x 3 days Sputum 12/30> Few Staph Aureus CSF 12/31 > NG x 2 days  Antimicrobials:  12/30 Ceftriaxone >  Interim history/subjective:  Sedated, unresponsive on vent Bradycardic to 50s overnight  Objective   Blood pressure 129/83, pulse 64, temperature 98.2 F (36.8 C), resp. rate (!) 27, height 5\' 8"  (1.727 m), weight 86.9 kg, SpO2 99 %.    Vent Mode: PRVC FiO2 (%):  [40 %] 40 % Set Rate:  [19 bmp-127 bmp] 27 bmp Vt Set:  [410 mL] 410 mL PEEP:  [5 cmH20] 5 cmH20 Delta P (Amplitude):  [5] 5 Pressure Support:  [5 cmH20-10 cmH20] 5 cmH20 Plateau Pressure:  [10 cmH20-15 cmH20] 15 cmH20   Intake/Output Summary (Last 24 hours) at 01/21/2018 0708 Last data filed at 01/21/2018 0600 Gross per 24 hour  Intake 3595.46 ml  Output 2010 ml  Net 1585.46 ml   Filed Weights   01/18/18 0500 01/19/18 0413 01/21/18 0500  Weight: 84.5 kg 84.5 kg 86.9 kg     Examination: General: Unresponsive on vent HENT: NCAT, ETT in place Lungs: CTAB, on ventilator  Cardiovascular: rrr, no m/r/g Abdomen: Soft, hypoactive BS Extremities: No edema, well perfused Neuro: Sedated, on responsive. Pupils 58mm and sluggish. No withdrawal to noxious stimuli   Resolved Hospital Problem list     Assessment & Plan:   Acute Hypoxic Respiratory Failure: Remains on vent; will consider extubation once mental status improves -- Full vent support -- Propofol for sedation  -- VAP prevention -- Pulm toilette   Severe Sepsis due to Pneumonia in RLL: Sputum cultures grew MSSA -- Ceftriaxone day 4 > de escalate to cefazolin  -- CXR yesterday with stable / improving infiltrate   Acute encephalopathy, seizure: Remain sedated on vent; no further seizure activity. MRI yesterday without anoxic injury.   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 -- LP 12/30 without pleocytosis; culture NGTD  Bradycardia: HR 50s overnight -- EKG -- Telemetry   Hypocalcemia: Downtrending, albumin normal on admission  -- Checking ionized calcium, mag  -- Replete pending labs  Hypokalemia: resolved  -- BMP, Mag  AKI: Resolved -- I&Os -- Daily BMP  Polysubstance Abuse: -- UDS positive for opiates, cocaine, and THC -- Monitor for withdrawal   Best practice:  Diet: NPO Pain/Anxiety/Delirium protocol (if indicated): Versed, propofol, RASS goal -2 VAP protocol (if indicated): yes DVT prophylaxis: SCDs GI prophylaxis: IV Protonix 40 mg q24 hours Glucose control: Daily labs Mobility: Strict bed rest Code Status: FULL Family Communication: None at  bedside Disposition: Remain in ICU  Labs   CBC: Recent Labs  Lab 01/17/18 1128 01/17/18 1136 01/17/18 2040 01/17/18 2354 01/19/18 0707  WBC 22.2*  --  12.3* 12.1* 9.9  NEUTROABS 18.8*  --  9.9*  --   --   HGB 19.0*  20.7* 17.6* 16.3 14.8  HCT 61.3* 61.0* 57.2* 52.1* 44.4  MCV 94.7  --  93.9 95.1 89.9  PLT 270  --  144* 109* 133*    Basic Metabolic Panel: Recent Labs  Lab 01/17/18 2354 01/19/18 0707 01/19/18 1145 01/20/18 0649 01/20/18 1427  NA 147* 145 146* 146* 146*  K 3.5 2.6* 2.6* 2.5* 4.7  CL 118* 109 110 111 112*  CO2 18* 28 23 26 24   GLUCOSE 110* 103* 116* 139* 93  BUN 9 11 14  5* 6  CREATININE 1.50* 1.31* 1.21 1.07 1.16  CALCIUM 7.6* 8.8* 8.4* 8.0* 7.7*  MG  --  2.1  --  1.9  --   PHOS  --  1.2*  --   --   --    GFR: Estimated Creatinine Clearance: 84.4 mL/min (by C-G formula based on SCr of 1.16 mg/dL). Recent Labs  Lab 01/17/18 1127 01/17/18 1128 01/17/18 1553 01/17/18 2040 01/17/18 2354 01/19/18 0707  WBC  --  22.2*  --  12.3* 12.1* 9.9  LATICACIDVEN 4.83*  --  4.3* 2.6* 2.1*  --     Liver Function Tests: Recent Labs  Lab 01/17/18 1128  AST 32  ALT 34  ALKPHOS 72  BILITOT 0.9  PROT 7.9  ALBUMIN 4.4   No results for input(s): LIPASE, AMYLASE in the last 168 hours. Recent Labs  Lab 01/19/18 1145  AMMONIA 45*    ABG    Component Value Date/Time   PHART 7.301 (L) 01/18/2018 0557   PCO2ART 37.4 01/18/2018 0557   PO2ART 98.0 01/18/2018 0557   HCO3 18.3 (L) 01/18/2018 0557   TCO2 19 (L) 01/18/2018 0557   ACIDBASEDEF 7.0 (H) 01/18/2018 0557   O2SAT 97.0 01/18/2018 0557     Coagulation Profile: No results for input(s): INR, PROTIME in the last 168 hours.  Cardiac Enzymes: No results for input(s): CKTOTAL, CKMB, CKMBINDEX, TROPONINI in the last 168 hours.  HbA1C: No results found for: HGBA1C  CBG: Recent Labs  Lab 01/17/18 1134 01/17/18 1928 01/19/18 2336  GLUCAP 185* 129* 112*    Past Medical History  He,  has a past medical history of Acute stomach ulcer, Anxiety, Arthritis, Broken back (1980's), Chronic back pain, Depression, Diverticulitis of duodenum, Duodenal diverticulum, GERD (gastroesophageal reflux disease), H/O clavicle fracture,  Headache(784.0), IBS (irritable bowel syndrome), Solitary kidney, congenital, and Spinal stenosis.   Surgical History    Past Surgical History:  Procedure Laterality Date  . COLONOSCOPY N/A 05/16/2012   [facility]:XLKGM polyp-tubular adenoma. TI 10cm normal. Next TCS 04/2017.  Marland Kitchen COLONOSCOPY WITH PROPOFOL N/A 11/01/2017   Procedure: COLONOSCOPY WITH PROPOFOL;  Surgeon: Daneil Dolin, MD;  Location: [facility];  Service: Endoscopy;  Laterality: N/A;  10:00am  . ESOPHAGOGASTRODUODENOSCOPY N/A 09/29/2012   [facility]:UVOZDG ulcerative/erosive reflux esophagitis. Hiatal hernia. Multiple duodenal bulbar ulcers. Negative H.pylori serology  . ESOPHAGOGASTRODUODENOSCOPY N/A 06/02/2013   Dr. Gala Romney: Mild erosive reflux esophagitis  . ESOPHAGOGASTRODUODENOSCOPY (EGD) WITH PROPOFOL N/A 11/01/2017   Procedure: ESOPHAGOGASTRODUODENOSCOPY (EGD) WITH PROPOFOL;  Surgeon: Daneil Dolin, MD;  Location: [facility];  Service: Endoscopy;  Laterality: N/A;  . POLYPECTOMY  11/01/2017   Procedure: POLYPECTOMY;  Surgeon: Daneil Dolin, MD;  Location:  [facility];  Service: Endoscopy;;  colon     Social History   reports that he has been smoking cigarettes. He has a 30.00 pack-year smoking history. He has never used smokeless tobacco. He reports that he does not drink alcohol or use drugs.   Family History   His family history includes Diabetes in his mother; Heart failure in his mother; Hypertension in his mother. There is no history of Colon cancer.   Allergies No Known Allergies   Home Medications  Prior to Admission medications   Medication Sig Start Date End Date Taking? Authorizing Provider  buprenorphine (BUTRANS) 5 MCG/HR PTWK patch Place 5 mcg onto the skin once a week.   Yes [provider]  busPIRone (BUSPAR) 10 MG tablet Take 10 mg by mouth 3 (three) times daily.   Yes [provider]  CARAFATE 1 GM/10ML suspension TAKE 10 MLS BY MOUTH FOUR TIMES DAILY. Patient taking differently:  Take 1 g by mouth 4 (four) times daily.  10/12/17  Yes Carlis Stable, NP  citalopram (CELEXA) 20 MG tablet Take 20 mg by mouth daily.   Yes [provider]  DEXILANT 60 MG capsule TAKE ONE CAPSULE BY MOUTH ONCE DAILY. Patient taking differently: Take 60 mg by mouth daily.  11/24/17  Yes Carlis Stable, NP  DULoxetine (CYMBALTA) 30 MG capsule Take 30 mg by mouth daily. Take 1 capsule twice daily   Yes [provider]  haloperidol (HALDOL) 5 MG tablet Take 5 mg by mouth at bedtime.   Yes [provider]  traZODone (DESYREL) 50 MG tablet Take 50-100 mg by mouth at bedtime as needed for sleep.    Yes [provider]  albuterol (PROVENTIL HFA;VENTOLIN HFA) 108 (90 Base) MCG/ACT inhaler Inhale 3 puffs into the lungs every 4 (four) hours as needed for wheezing or shortness of breath.     [provider]  Menthol, Topical Analgesic, (ABSORBINE PAIN RELIEVING EX) Apply 1 patch topically daily as needed (for pain).    [provider]     Critical care time:      Velna Ochs, M.D. - PGY3 Pager: [phone] 01/21/2018, 7:08 AM

## 2018-01-22 LAB — CULTURE, BLOOD (ROUTINE X 2)
CULTURE: NO GROWTH
CULTURE: NO GROWTH

## 2018-01-22 LAB — GLUCOSE, CAPILLARY
GLUCOSE-CAPILLARY: 111 mg/dL — AB (ref 70–99)
Glucose-Capillary: 100 mg/dL — ABNORMAL HIGH (ref 70–99)
Glucose-Capillary: 102 mg/dL — ABNORMAL HIGH (ref 70–99)
Glucose-Capillary: 93 mg/dL (ref 70–99)
Glucose-Capillary: 97 mg/dL (ref 70–99)

## 2018-01-22 LAB — BASIC METABOLIC PANEL
Anion gap: 10 (ref 5–15)
BUN: 5 mg/dL — ABNORMAL LOW (ref 6–20)
CO2: 24 mmol/L (ref 22–32)
Calcium: 8.4 mg/dL — ABNORMAL LOW (ref 8.9–10.3)
Chloride: 109 mmol/L (ref 98–111)
Creatinine, Ser: 0.87 mg/dL (ref 0.61–1.24)
GFR calc Af Amer: >60 mL/min (ref >60)
GFR calc non Af Amer: >60 mL/min (ref >60)
Glucose, Bld: 111 mg/dL — ABNORMAL HIGH (ref 70–99)
Potassium: 2.9 mmol/L — ABNORMAL LOW (ref 3.5–5.1)
SODIUM: 143 mmol/L (ref 135–145)

## 2018-01-22 LAB — PHOSPHORUS: Phosphorus: 3.1 mg/dL (ref 2.5–4.6)

## 2018-01-22 LAB — MAGNESIUM: Magnesium: 2.3 mg/dL (ref 1.7–2.4)

## 2018-01-22 MED ORDER — DEXMEDETOMIDINE HCL IN NACL 400 MCG/100ML IV SOLN
0.0000 ug/kg/h | INTRAVENOUS | Status: DC
  Administered 2018-01-22: 0.2 ug/kg/h via INTRAVENOUS
  Administered 2018-01-23: 0.6 ug/kg/h via INTRAVENOUS
  Administered 2018-01-23 (×2): 0.3 ug/kg/h via INTRAVENOUS
  Administered 2018-01-24: 0.4 ug/kg/h via INTRAVENOUS
  Administered 2018-01-25: 0.6 ug/kg/h via INTRAVENOUS
  Administered 2018-01-25: 0.4 ug/kg/h via INTRAVENOUS
  Administered 2018-01-25: 0.6 ug/kg/h via INTRAVENOUS
  Administered 2018-01-26: 1.2 ug/kg/h via INTRAVENOUS
  Administered 2018-01-26: 0.7 ug/kg/h via INTRAVENOUS
  Administered 2018-01-26 (×2): 1.2 ug/kg/h via INTRAVENOUS
  Administered 2018-01-27 (×4): 1 ug/kg/h via INTRAVENOUS
  Administered 2018-01-28: 0.8 ug/kg/h via INTRAVENOUS
  Administered 2018-01-28: 1.2 ug/kg/h via INTRAVENOUS
  Administered 2018-01-28: 0.8 ug/kg/h via INTRAVENOUS
  Administered 2018-01-28: 0.9 ug/kg/h via INTRAVENOUS
  Administered 2018-01-28 – 2018-01-29 (×2): 1.2 ug/kg/h via INTRAVENOUS
  Filled 2018-01-22 (×26): qty 100

## 2018-01-22 MED ORDER — LORAZEPAM 2 MG/ML IJ SOLN
1.0000 mg | INTRAMUSCULAR | Status: DC | PRN
  Administered 2018-01-22 – 2018-01-24 (×4): 2 mg via INTRAVENOUS
  Administered 2018-01-24 (×2): 1 mg via INTRAVENOUS
  Administered 2018-01-24: 2 mg via INTRAVENOUS
  Administered 2018-01-24: 1 mg via INTRAVENOUS
  Administered 2018-01-25 – 2018-01-26 (×5): 2 mg via INTRAVENOUS
  Filled 2018-01-22 (×12): qty 1

## 2018-01-22 MED ORDER — ORAL CARE MOUTH RINSE
15.0000 mL | Freq: Two times a day (BID) | OROMUCOSAL | Status: DC
  Administered 2018-01-22 – 2018-02-01 (×18): 15 mL via OROMUCOSAL

## 2018-01-22 MED ORDER — POTASSIUM CHLORIDE 10 MEQ/100ML IV SOLN
10.0000 meq | INTRAVENOUS | Status: AC
  Administered 2018-01-22 (×6): 10 meq via INTRAVENOUS
  Filled 2018-01-22: qty 100

## 2018-01-22 MED ORDER — SODIUM CHLORIDE 0.9 % IV SOLN
100.0000 mg | Freq: Two times a day (BID) | INTRAVENOUS | Status: DC
  Administered 2018-01-22 – 2018-01-26 (×8): 100 mg via INTRAVENOUS
  Filled 2018-01-22 (×9): qty 10

## 2018-01-22 MED ORDER — SODIUM CHLORIDE 0.9 % IV SOLN
200.0000 mg | Freq: Once | INTRAVENOUS | Status: AC
  Administered 2018-01-22: 200 mg via INTRAVENOUS
  Filled 2018-01-22: qty 20

## 2018-01-22 MED ORDER — HYDRALAZINE HCL 20 MG/ML IJ SOLN
5.0000 mg | Freq: Once | INTRAMUSCULAR | Status: AC
  Administered 2018-01-22: 5 mg via INTRAVENOUS
  Filled 2018-01-22: qty 1

## 2018-01-22 MED ORDER — POTASSIUM CHLORIDE 20 MEQ/15ML (10%) PO SOLN
40.0000 meq | Freq: Once | ORAL | Status: AC
  Administered 2018-01-22: 40 meq
  Filled 2018-01-22: qty 30

## 2018-01-22 MED ORDER — HYDRALAZINE HCL 20 MG/ML IJ SOLN: 10.0000 mg | INTRAMUSCULAR | Status: DC | PRN

## 2018-01-22 NOTE — Progress Notes (Signed)
NAME:  Benjamin [name], MRN:  [id number], DOB:  [birth year], LOS: 5 ADMISSION DATE:  01/17/2018, CONSULTATION DATE:  01/17/2018 REFERRING MD:  EDP, CHIEF COMPLAINT:  Confusion   Brief History   48 y/o male with polysubstance abuse admitted with acute encephalopathy and seizure, intubated in ER for airway protection.  Past Medical History  Polysubstance abuse, anxiety, chronic back pain, diveritulitis, GERD, Solitary kidney, spinal stenosis  Significant Hospital Events   12/30 admission, intubation 12/31 worrisome findings for recent status epilepticus, continuous EEG monitoring arranged, started propofol  Consults:  Neurology  Procedures:  12/30 > intubated 12/31 lumbar puncture> no pleocytosis  Significant Diagnostic Tests:  CT head 12/30>Multifocal paranasal sinus disease with patient intubated. Brain parenchyma appears unremarkable. No mass or hemorrhage. EEG 12/31> worrisome for burst suppression, recent status epilepticus MRI Brain 1/2> No acute abnormality; no evidence of hypoxic ischemic injury  Micro Data:  Blood12/30 >NG x 3 days Sputum 12/30> Few Staph Aureus CSF 12/31 > NG x 2 days  Antimicrobials:  12/30 Ceftriaxone >  Interim history/subjective:  Starting to look at [place] to voice No purposeful movements  Objective   Blood pressure 131/74, pulse 87, temperature 99.1 F (37.3 C), resp. rate (!) 24, height 5\' 8"  (1.727 m), weight 87.6 kg, SpO2 99 %.    Vent Mode: CPAP;PSV FiO2 (%):  [40 %] 40 % Set Rate:  [27 bmp] 27 bmp Vt Set:  [410 mL] 410 mL PEEP:  [5 cmH20] 5 cmH20 Pressure Support:  [5 cmH20] 5 cmH20 Plateau Pressure:  [10 cmH20-15 cmH20] 11 cmH20   Intake/Output Summary (Last 24 hours) at 01/22/2018 1219 Last data filed at 01/22/2018 1107 Gross per 24 hour  Intake 3896.59 ml  Output 3165 ml  Net 731.59 ml   Filed Weights   01/19/18 0413 01/21/18 0500 01/22/18 0500  Weight: 84.5 kg 86.9 kg 87.6 kg    Examination:  General:  In bed on  vent HENT: NCAT ETT in place PULM: CTA B, vent supported breathing CV: RRR, no mgr GI: BS+, soft, nontender MSK: normal bulk and tone Neuro: sedate on vent but will open eyes to voice and look at me    Resolved Hospital Problem list   AKI Ileus  Assessment & Plan:   Acute Hypoxic Respiratory Failure: Remains on vent; will consider extubation once mental status improves > Full mechanical vent support > VAP prevention > Daily WUA/SBT > consider extubation when mental status improves   Severe Sepsis due to Pneumonia in RLL: Sputum cultures grew MSSA > continue cefazolin  Acute encephalopathy, seizure: see prior discussion in notes, continues to improve as of 1/4 > continue keppra, propofol, valproate per neurology > wean off sedation as able  Bradycardia: HR 50s overnight > EKG, telemetry > no role for atropine as hemodynamically stable  Hypokalemia: resolved  > monitor BMET and Mg > replace K again today  Polysubstance Abuse: > monitor for withdrawal   Best practice:  Diet: tube feeding Pain/Anxiety/Delirium protocol (if indicated): Versed, propofol, RASS goal -2 VAP protocol (if indicated): yes DVT prophylaxis: SCDs GI prophylaxis: Protonix 40 mg q24 hours Glucose control: Daily labs Mobility: Strict bed rest Code Status: FULL Family Communication: None at bedside Disposition: Remain in ICU  Labs   CBC: Recent Labs  Lab 01/17/18 1128 01/17/18 1136 01/17/18 2040 01/17/18 2354 01/19/18 0707 01/21/18 0947  WBC 22.2*  --  12.3* 12.1* 9.9 6.9  NEUTROABS 18.8*  --  9.9*  --   --   --  HGB 19.0* 20.7* 17.6* 16.3 14.8 13.9  HCT 61.3* 61.0* 57.2* 52.1* 44.4 41.8  MCV 94.7  --  93.9 95.1 89.9 90.7  PLT 270  --  144* 109* 133* 671    Basic Metabolic Panel: Recent Labs  Lab 01/19/18 0707  01/20/18 0649 01/20/18 1427 01/21/18 0947 01/21/18 1440 01/21/18 1628 01/22/18 0220  NA 145   < > 146* 146* 146* 145  --  143  K 2.6*   < > 2.5* 4.7 2.6* 3.5  --   2.9*  CL 109   < > 111 112* 109 112*  --  109  CO2 28   < > 26 24 27 24   --  24  GLUCOSE 103*   < > 139* 93 108* 125*  --  111*  BUN 11   < > 5* 6 <5* <5*  --  5*  CREATININE 1.31*   < > 1.07 1.16 1.04 1.00  --  0.87  CALCIUM 8.8*   < > 8.0* 7.7* 7.9* 8.2*  --  8.4*  MG 2.1  --  1.9  --  1.8 2.5* 2.4 2.3  PHOS 1.2*  --   --   --   --  2.7 2.3* 3.1   < > = values in this interval not displayed.   GFR: Estimated Creatinine Clearance: 113 mL/min (by C-G formula based on SCr of 0.87 mg/dL). Recent Labs  Lab 01/17/18 1127  01/17/18 1553 01/17/18 2040 01/17/18 2354 01/19/18 0707 01/21/18 0947  WBC  --    < >  --  12.3* 12.1* 9.9 6.9  LATICACIDVEN 4.83*  --  4.3* 2.6* 2.1*  --   --    < > = values in this interval not displayed.    Liver Function Tests: Recent Labs  Lab 01/17/18 1128  AST 32  ALT 34  ALKPHOS 72  BILITOT 0.9  PROT 7.9  ALBUMIN 4.4   No results for input(s): LIPASE, AMYLASE in the last 168 hours. Recent Labs  Lab 01/19/18 1145 01/21/18 1440  AMMONIA 45* 57*    ABG    Component Value Date/Time   PHART 7.301 (L) 01/18/2018 0557   PCO2ART 37.4 01/18/2018 0557   PO2ART 98.0 01/18/2018 0557   HCO3 18.3 (L) 01/18/2018 0557   TCO2 19 (L) 01/18/2018 0557   ACIDBASEDEF 7.0 (H) 01/18/2018 0557   O2SAT 97.0 01/18/2018 0557     Coagulation Profile: No results for input(s): INR, PROTIME in the last 168 hours.  Cardiac Enzymes: No results for input(s): CKTOTAL, CKMB, CKMBINDEX, TROPONINI in the last 168 hours.  HbA1C: No results found for: HGBA1C  CBG: Recent Labs  Lab 01/21/18 1510 01/21/18 1933 01/21/18 2335 01/22/18 0356 01/22/18 0753  GLUCAP 109* 111* 111* 100* 111*    Past Medical History  He,  has a past medical history of Acute stomach ulcer, Anxiety, Arthritis, Broken back (1980's), Chronic back pain, Depression, Diverticulitis of duodenum, Duodenal diverticulum, GERD (gastroesophageal reflux disease), H/O clavicle fracture,  Headache(784.0), IBS (irritable bowel syndrome), Solitary kidney, congenital, and Spinal stenosis.   Surgical History    Past Surgical History:  Procedure Laterality Date  . COLONOSCOPY N/A 05/16/2012   IWP:YKDXI polyp-tubular adenoma. TI 10cm normal. Next TCS 04/2017.  Marland Kitchen COLONOSCOPY WITH PROPOFOL N/A 11/01/2017   Procedure: COLONOSCOPY WITH PROPOFOL;  Surgeon: Daneil Dolin, MD;  Location: [facility];  Service: Endoscopy;  Laterality: N/A;  10:00am  . ESOPHAGOGASTRODUODENOSCOPY N/A 09/29/2012   PJA:SNKNLZ ulcerative/erosive reflux esophagitis. Hiatal hernia.  Multiple duodenal bulbar ulcers. Negative H.pylori serology  . ESOPHAGOGASTRODUODENOSCOPY N/A 06/02/2013   Dr. Gala Romney: Mild erosive reflux esophagitis  . ESOPHAGOGASTRODUODENOSCOPY (EGD) WITH PROPOFOL N/A 11/01/2017   Procedure: ESOPHAGOGASTRODUODENOSCOPY (EGD) WITH PROPOFOL;  Surgeon: Daneil Dolin, MD;  Location: [facility];  Service: Endoscopy;  Laterality: N/A;  . POLYPECTOMY  11/01/2017   Procedure: POLYPECTOMY;  Surgeon: Daneil Dolin, MD;  Location: [facility];  Service: Endoscopy;;  colon     Social History   reports that he has been smoking cigarettes. He has a 30.00 pack-year smoking history. He has never used smokeless tobacco. He reports that he does not drink alcohol or use drugs.   Family History   His family history includes Diabetes in his mother; Heart failure in his mother; Hypertension in his mother. There is no history of Colon cancer.   Allergies No Known Allergies   Home Medications  Prior to Admission medications   Medication Sig Start Date End Date Taking? Authorizing Provider  buprenorphine (BUTRANS) 5 MCG/HR PTWK patch Place 5 mcg onto the skin once a week.   Yes [provider]  busPIRone (BUSPAR) 10 MG tablet Take 10 mg by mouth 3 (three) times daily.   Yes [provider]  CARAFATE 1 GM/10ML suspension TAKE 10 MLS BY MOUTH FOUR TIMES DAILY. Patient taking differently:  Take 1 g by mouth 4 (four) times daily.  10/12/17  Yes Carlis Stable, NP  citalopram (CELEXA) 20 MG tablet Take 20 mg by mouth daily.   Yes [provider]  DEXILANT 60 MG capsule TAKE ONE CAPSULE BY MOUTH ONCE DAILY. Patient taking differently: Take 60 mg by mouth daily.  11/24/17  Yes Carlis Stable, NP  DULoxetine (CYMBALTA) 30 MG capsule Take 30 mg by mouth daily. Take 1 capsule twice daily   Yes [provider]  haloperidol (HALDOL) 5 MG tablet Take 5 mg by mouth at bedtime.   Yes [provider]  traZODone (DESYREL) 50 MG tablet Take 50-100 mg by mouth at bedtime as needed for sleep.    Yes [provider]  albuterol (PROVENTIL HFA;VENTOLIN HFA) 108 (90 Base) MCG/ACT inhaler Inhale 3 puffs into the lungs every 4 (four) hours as needed for wheezing or shortness of breath.     [provider]  Menthol, Topical Analgesic, (ABSORBINE PAIN RELIEVING EX) Apply 1 patch topically daily as needed (for pain).    [provider]     Critical care time: 35 minutes      Roselie Awkward, MD [place] Pager: [phone] Cell: [phone] If no response, call [phone]   01/22/2018, 12:19 PM

## 2018-01-22 NOTE — Progress Notes (Signed)
Neurology Progress Note   S:// Patient seen and examined. He is awake on low-dose of propofol. Sedation was lowered but he became very agitated and sedation had to be restarted at higher dose to avoid vent dyssynchrony.  O:// Current vital signs: BP (!) 159/93   Pulse 98   Temp 98.8 F (37.1 C) (Bladder)   Resp (!) 26   Ht '5\' 8"'$  (1.727 m)   Wt 87.6 kg   SpO2 98%   BMI 29.36 kg/m  Vital signs in last 24 hours: Temp:  [98.1 F (36.7 C)-100.4 F (38 C)] 98.8 F (37.1 C) (01/04 0800) Pulse Rate:  [67-120] 98 (01/04 0812) Resp:  [19-34] 26 (01/04 0812) BP: (131-170)/(70-102) 159/93 (01/04 0812) SpO2:  [97 %-100 %] 98 % (01/04 0812) FiO2 (%):  [40 %] 40 % (01/04 0812) Weight:  [87.6 kg] 87.6 kg (01/04 0500) General: Patient is awake, appears in no distress initially but upon further examination starts breathing heavier HEENT: Normocephalic atraumatic CVS: S1 is heard regular rate rhythm Respiration: Vented, clear chest Abdomen: Soft nondistended Extremities: Warm well perfused Neurological exam Is awake, did not follow commands for me but was able to blink on command for the RN this morning. He has his eyes open and looks both ways but not to command. His pupils are equal round reactive to light.  He blinks to threat from both sides.  Difficult to ascertain facial symmetry because of the endotracheal tube. Motor exam: He does not move anything to command but is spontaneously moving upper extremities with symmetric strength and some movement in both lower extremities at least hip flexion that was observed during the exam. Sensory exam: Grimaces to noxious stimulation in all 4 Coordination and gait cannot be tested.  Medications  Current Facility-Administered Medications:  .  0.9 %  sodium chloride infusion, 250 mL, Intravenous, Continuous, Dorie Rank, MD .  0.9 %  sodium chloride infusion, 250 mL, Intravenous, Continuous, Aljishi, Virgina Norfolk, MD .  acetaminophen (TYLENOL)  tablet 650 mg, 650 mg, Oral, Q4H PRN, Aljishi, Virgina Norfolk, MD .  ceFAZolin (ANCEF) IVPB 2g/100 mL premix, 2 g, Intravenous, Q8H, Velna Ochs, MD, Stopped at 01/22/18 [phone] .  chlorhexidine gluconate (MEDLINE KIT) (PERIDEX) 0.12 % solution 15 mL, 15 mL, Mouth Rinse, BID, Aljishi, Virgina Norfolk, MD, 15 mL at 01/22/18 0817 .  feeding supplement (PRO-STAT SUGAR FREE 64) liquid 30 mL, 30 mL, Per Tube, BID, Velna Ochs, MD, 30 mL at 01/21/18 2138 .  feeding supplement (VITAL AF 1.2 CAL) liquid 1,000 mL, 1,000 mL, Per Tube, Continuous, Guilloud, Hoyle Sauer, MD, Last Rate: 60 mL/hr at 01/21/18 1546, 1,000 mL at 01/21/18 1546 .  free water 200 mL, 200 mL, Per Tube, Q4H, Ledell Noss, MD, 200 mL at 01/22/18 0758 .  heparin injection 5,000 Units, 5,000 Units, Subcutaneous, Q8H, Harvel Quale, RPH, 5,000 Units at 01/22/18 0518 .  hydrALAZINE (APRESOLINE) injection 5 mg, 5 mg, Intravenous, Q4H PRN, Velna Ochs, MD, 5 mg at 01/21/18 1238 .  levETIRAcetam (KEPPRA) IVPB 1000 mg/100 mL premix, 1,000 mg, Intravenous, Q12H, Juanito Doom, MD, Stopped at 01/21/18 2207 .  MEDLINE mouth rinse, 15 mL, Mouth Rinse, 10 times per day, Aldean Jewett, MD, 15 mL at 01/22/18 0509 .  midazolam (VERSED) injection 2 mg, 2 mg, Intravenous, Q15 min PRN, Deterding, Guadelupe Sabin, MD, 2 mg at 01/18/18 0947 .  midazolam (VERSED) injection 2 mg, 2 mg, Intravenous, Q2H PRN, Deterding, Guadelupe Sabin, MD, 2 mg at 01/21/18 1909 .  norepinephrine (LEVOPHED) '4mg'$  in D5W 278m premix infusion, 2-10 mcg/min, Intravenous, Titrated, KDorie Rank MD, Stopped at 01/17/18 2053 .  pantoprazole sodium (PROTONIX) 40 mg/20 mL oral suspension 40 mg, 40 mg, Per Tube, Daily, McQuaid, Douglas B, MD .  potassium chloride (KLOR-CON) packet 20 mEq, 20 mEq, Per Tube, BID, GVelna Ochs MD, 20 mEq at 01/21/18 2138 .  propofol (DIPRIVAN) 1000 MG/100ML infusion, 5-80 mcg/kg/min, Intravenous, Titrated, McQuaid, Douglas B, MD, Last Rate: 15.77 mL/hr at  01/22/18 0800, 30 mcg/kg/min at 01/22/18 0800 .  senna-docusate (Senokot-S) tablet 1 tablet, 1 tablet, Per Tube, BID, Masters, AJake Church [facility] 1 tablet at 01/21/18 2138 .  valproate (DEPACON) 500 mg in dextrose 5 % 50 mL IVPB, 500 mg, Intravenous, Q8H, KGreta Doom MD, Stopped at 01/22/18 0745 Labs CBC    Component Value Date/Time   WBC 6.9 01/21/2018 0947   RBC 4.61 01/21/2018 0947   HGB 13.9 01/21/2018 0947   HCT 41.8 01/21/2018 0947   PLT 150 01/21/2018 0947   MCV 90.7 01/21/2018 0947   MCH 30.2 01/21/2018 0947   MCHC 33.3 01/21/2018 0947   RDW 13.9 01/21/2018 0947   LYMPHSABS 1.6 01/17/2018 2040   MONOABS 0.6 01/17/2018 2040   EOSABS 0.1 01/17/2018 2040   BASOSABS 0.0 01/17/2018 2040    CMP     Component Value Date/Time   NA 143 01/22/2018 0220   K 2.9 (L) 01/22/2018 0220   CL 109 01/22/2018 0220   CO2 24 01/22/2018 0220   GLUCOSE 111 (H) 01/22/2018 0220   BUN 5 (L) 01/22/2018 0220   CREATININE 0.87 01/22/2018 0220   CREATININE 1.22 08/06/2017 1619   CALCIUM 8.4 (L) 01/22/2018 0220   PROT 7.9 01/17/2018 1128   ALBUMIN 4.4 01/17/2018 1128   AST 32 01/17/2018 1128   ALT 34 01/17/2018 1128   ALKPHOS 72 01/17/2018 1128   BILITOT 0.9 01/17/2018 1128   GFRNONAA >60 01/22/2018 0220   GFRAA >60 01/22/2018 0220  Valproate level 61 Ammonia mildly elevated at 57  Imaging I have reviewed images in epic and the results pertinent to this consultation are: MRI brain with no acute changes.  Assessment:  48year old man with a past medical history of polysubstance abuse, presented with new onset seizure.  LP was done which was negative for infection. EEG showed generalized rhythmic delta activity and findings probably suggestive of a burnt out status epilepticus. He had required heavy sedation to be able to maintain vent synchronization and exam was marred by sedation. Today with large sedation he is awake but not yet following commands consistently. MRI of the  brain did not show any evidence of anoxic brain injury. Likely prolonged postictal phase causing encephalopathy in the setting of probable poor brain reserve or brain injury from polysubstance use.  Labs showed normal therapeutic valproate level but mildly elevated ammonia.   Recommendations: Continue attempts to lower sedation or to use Precedex instead of propofol if possible Continue Keppra at current dose. Discontinue Depakote Add Vimpat (200 mg IV load followed by 100 mg BID from tonight) to replace Depakote Lactulose for hyperammonemia Recheck ammonia sometime today and lactulose again as needed for correction of hyperammonemia.  -- AAmie Portland MD [facility] Pager: [phone] 7pm to 7am, please call on call as listed on AMION.  CRITICAL CARE ATTESTATION Performed by: AAmie Portland MD Total critical care time: 40 minutes Critical care time was exclusive of separately billable procedures and treating other patients and/or supervising APPs/Residents/Students Critical care was necessary  to treat or prevent imminent or life-threatening deterioration due to toxic metabolic encephalopathy, status epilepticus resolved. This patient is critically ill and at significant risk for neurological worsening and/or death and care requires constant monitoring. Critical care was time spent personally by me on the following activities: development of treatment plan with patient and/or surrogate as well as nursing, discussions with consultants, evaluation of patient's response to treatment, examination of patient, obtaining history from patient or surrogate, ordering and performing treatments and interventions, ordering and review of laboratory studies, ordering and review of radiographic studies, pulse oximetry, re-evaluation of patient's condition, participation in multidisciplinary rounds and medical decision making of high complexity in the care of this patient.

## 2018-01-22 NOTE — Progress Notes (Signed)
[facility] Progress Note Patient Name: BERT PTACEK DOB: 1970-07-16 MRN: [id number]   Date of Service  01/22/2018  HPI/Events of Note  Delirium   eICU Interventions  Will order: 1. Low dose Precedex IV infusion. Titrate to RASS = 0.      Intervention Category Major Interventions: Delirium, psychosis, severe agitation - evaluation and management  Alaia Lordi Eugene 01/22/2018, 9:10 PM

## 2018-01-22 NOTE — Progress Notes (Signed)
[facility] PCCM  More awake and alert Now following commands Passing SBT all day Will extubate  Roselie Awkward, MD [facility] P[facility] Pager: [phone] Cell: [phone] If no response, call [phone]

## 2018-01-22 NOTE — Procedures (Signed)
Extubation Procedure Note  Patient Details:   Name: CHISTIAN KASLER DOB: April 24, 1970 MRN: [id number]   Airway Documentation:    Vent end date: 01/22/18 Vent end time: 1608   Evaluation  O2 sats: stable throughout Complications: No apparent complications Patient did tolerate procedure well. Bilateral Breath Sounds: Diminished   Yes   Pt extubated per MD order.  Pt placed on 4L [place].  Sats 96%.  Pt with moderately strong cough.  No distress noted.  RN at bedside.  RT will continue to monitor.  Pierre Bali 01/22/2018, 4:16 PM

## 2018-01-23 ENCOUNTER — Inpatient Hospital Stay (HOSPITAL_COMMUNITY): Payer: Medicaid Other

## 2018-01-23 LAB — CBC WITH DIFFERENTIAL/PLATELET
Abs Immature Granulocytes: 0.17 10*3/uL — ABNORMAL HIGH (ref 0.00–0.07)
Basophils Absolute: 0 10*3/uL (ref 0.0–0.1)
Basophils Relative: 0 %
Eosinophils Absolute: 0.1 10*3/uL (ref 0.0–0.5)
Eosinophils Relative: 1 %
HCT: 44 % (ref 39.0–52.0)
Hemoglobin: 14 g/dL (ref 13.0–17.0)
Immature Granulocytes: 2 %
Lymphocytes Relative: 25 %
Lymphs Abs: 2.3 10*3/uL (ref 0.7–4.0)
MCH: 29.5 pg (ref 26.0–34.0)
MCHC: 31.8 g/dL (ref 30.0–36.0)
MCV: 92.8 fL (ref 80.0–100.0)
Monocytes Absolute: 0.7 10*3/uL (ref 0.1–1.0)
Monocytes Relative: 8 %
NEUTROS PCT: 64 %
Neutro Abs: 5.6 10*3/uL (ref 1.7–7.7)
PLATELETS: 158 10*3/uL (ref 150–400)
RBC: 4.74 MIL/uL (ref 4.22–5.81)
RDW: 14.1 % (ref 11.5–15.5)
WBC: 9 10*3/uL (ref 4.0–10.5)
nRBC: 0 % (ref 0.0–0.2)

## 2018-01-23 LAB — BASIC METABOLIC PANEL
ANION GAP: 9 (ref 5–15)
BUN: 7 mg/dL (ref 6–20)
CO2: 25 mmol/L (ref 22–32)
Calcium: 8.5 mg/dL — ABNORMAL LOW (ref 8.9–10.3)
Chloride: 110 mmol/L (ref 98–111)
Creatinine, Ser: 0.85 mg/dL (ref 0.61–1.24)
GFR calc Af Amer: >60 mL/min (ref >60)
GLUCOSE: 77 mg/dL (ref 70–99)
Potassium: 3.7 mmol/L (ref 3.5–5.1)
Sodium: 144 mmol/L (ref 135–145)

## 2018-01-23 LAB — GLUCOSE, CAPILLARY: Glucose-Capillary: 82 mg/dL (ref 70–99)

## 2018-01-23 LAB — CALCIUM, IONIZED: Calcium, Ionized, Serum: 4.4 mg/dL — ABNORMAL LOW (ref 4.5–5.6)

## 2018-01-23 MED ORDER — LORAZEPAM 2 MG/ML IJ SOLN
1.0000 mg | Freq: Three times a day (TID) | INTRAMUSCULAR | Status: DC
  Administered 2018-01-23 – 2018-01-27 (×9): 1 mg via INTRAVENOUS
  Filled 2018-01-23 (×10): qty 1

## 2018-01-23 NOTE — Progress Notes (Signed)
SLP Cancellation Note  Patient Details Name: Benjamin [name] MRN: [id number] DOB: Apr 03, 1970   Cancelled treatment:       Reason Eval/Treat Not Completed: Medical issues which prohibited therapy. RN requests holding swallow evaluation at this time; pt actively withdrawing. Will check back this afternoon as able or next date if appropriate.  Deneise Lever, [place], CCC-SLP Speech-Language Pathologist Acute Rehabilitation Services Pager: [phone] Office: [phone]    Aliene Altes 01/23/2018, 10:20 AM

## 2018-01-23 NOTE — Progress Notes (Signed)
Neurology Progress Note   S:// Patient seen and examined.  Extubated yesterday. On Precedex. Has been consistently following commands and has been alert and oriented some.   O:// Current vital signs: BP (!) 130/94   Pulse 77   Temp 98.1 F (36.7 C)   Resp 19   Ht 5\' 8"  (1.727 m)   Wt 84 kg   SpO2 97%   BMI 28.16 kg/m  Vital signs in last 24 hours: Temp:  [98.1 F (36.7 C)-99.7 F (37.6 C)] 98.1 F (36.7 C) (01/05 0730) Pulse Rate:  [48-110] 77 (01/05 0730) Resp:  [13-38] 19 (01/05 0730) BP: (93-172)/(55-127) 130/94 (01/05 0730) SpO2:  [58 %-100 %] 97 % (01/05 0730) FiO2 (%):  [40 %] 40 % (01/04 1200) Weight:  [84 kg] 84 kg (01/05 0456) General: Patient is awake on Precedex HEENT: Normocephalic atraumatic CVS: G2-E3 regular rhythm Respiratory: Nonlabored breathing Extremities: Warm perfused no swelling Neurological exam He looks a little uncomfortable but is awake and able to tell me his name. At times he is oriented to place and keeps saying he is at [facility]. He is not oriented to time Speech appears dysarthric Very poor attention concentration Follows some simple commands Cranial nerves: Pupils equal round react light, extra movements intact, visual fields full, face symmetric, hearing intact. Motor exam: Spontaneously moving all 4 extremities with full strength. Sensation: Intact to noxious simulation Coordination and gait testing cannot be performed at this time due to his poor attention concentration  Medications  Current Facility-Administered Medications:  .  0.9 %  sodium chloride infusion, 250 mL, Intravenous, Continuous, Dorie Rank, MD .  0.9 %  sodium chloride infusion, 250 mL, Intravenous, Continuous, Aljishi, Virgina Norfolk, MD, Stopped at 01/22/18 2249 .  acetaminophen (TYLENOL) tablet 650 mg, 650 mg, Oral, Q4H PRN, Aljishi, Wael Z, MD .  ceFAZolin (ANCEF) IVPB 2g/100 mL premix, 2 g, Intravenous, Q8H, Velna Ochs, MD, Stopped at 01/23/18  0617 .  dexmedetomidine (PRECEDEX) 400 MCG/100ML (4 mcg/mL) infusion, 0.2-0.7 mcg/kg/hr, Intravenous, Titrated, Anders Simmonds, MD, Last Rate: 8.76 mL/hr at 01/23/18 0700, 0.4 mcg/kg/hr at 01/23/18 0700 .  feeding supplement (VITAL AF 1.2 CAL) liquid 1,000 mL, 1,000 mL, Per Tube, Continuous, Velna Ochs, MD, Stopped at 01/22/18 1200 .  heparin injection 5,000 Units, 5,000 Units, Subcutaneous, Q8H, Masters, Jake Church, RPH, 5,000 Units at 01/23/18 0547 .  hydrALAZINE (APRESOLINE) injection 10 mg, 10 mg, Intravenous, Q4H PRN, Anders Simmonds, MD .  lacosamide (VIMPAT) 100 mg in sodium chloride 0.9 % 25 mL IVPB, 100 mg, Intravenous, Q12H, Amie Portland, MD, Stopped at 01/22/18 2156 .  levETIRAcetam (KEPPRA) IVPB 1000 mg/100 mL premix, 1,000 mg, Intravenous, Q12H, Juanito Doom, MD, Stopped at 01/22/18 2123 .  LORazepam (ATIVAN) injection 1-2 mg, 1-2 mg, Intravenous, Q4H PRN, Simonne Maffucci B, MD, 2 mg at 01/22/18 2359 .  MEDLINE mouth rinse, 15 mL, Mouth Rinse, BID, McQuaid, Douglas B, MD, 15 mL at 01/22/18 2109 .  norepinephrine (LEVOPHED) 4mg  in D5W 259mL premix infusion, 2-10 mcg/min, Intravenous, Titrated, Dorie Rank, MD, Stopped at 01/17/18 2053 .  pantoprazole sodium (PROTONIX) 40 mg/20 mL oral suspension 40 mg, 40 mg, Per Tube, Daily, Simonne Maffucci B, MD, 40 mg at 01/22/18 0954 .  propofol (DIPRIVAN) 1000 MG/100ML infusion, 5-80 mcg/kg/min, Intravenous, Titrated, McQuaid, Douglas B, MD, Last Rate: 23.7 mL/hr at 01/22/18 1500, 45 mcg/kg/min at 01/22/18 1500 .  senna-docusate (Senokot-S) tablet 1 tablet, 1 tablet, Per Tube, BID, Masters, Jake Church, RPH, 1 tablet  at 01/21/18 2138 Labs CBC    Component Value Date/Time   WBC 9.0 01/23/2018 0355   RBC 4.74 01/23/2018 0355   HGB 14.0 01/23/2018 0355   HCT 44.0 01/23/2018 0355   PLT 158 01/23/2018 0355   MCV 92.8 01/23/2018 0355   MCH 29.5 01/23/2018 0355   MCHC 31.8 01/23/2018 0355   RDW 14.1 01/23/2018 0355   LYMPHSABS 2.3  01/23/2018 0355   MONOABS 0.7 01/23/2018 0355   EOSABS 0.1 01/23/2018 0355   BASOSABS 0.0 01/23/2018 0355    CMP     Component Value Date/Time   NA 144 01/23/2018 0355   K 3.7 01/23/2018 0355   CL 110 01/23/2018 0355   CO2 25 01/23/2018 0355   GLUCOSE 77 01/23/2018 0355   BUN 7 01/23/2018 0355   CREATININE 0.85 01/23/2018 0355   CREATININE 1.22 08/06/2017 1619   CALCIUM 8.5 (L) 01/23/2018 0355   PROT 7.9 01/17/2018 1128   ALBUMIN 4.4 01/17/2018 1128   AST 32 01/17/2018 1128   ALT 34 01/17/2018 1128   ALKPHOS 72 01/17/2018 1128   BILITOT 0.9 01/17/2018 1128   GFRNONAA >60 01/23/2018 0355   GFRAA >60 01/23/2018 0355    Imaging I have reviewed images in epic and the results pertinent to this consultation are: MRI brain with no acute changes.  No evidence of anoxic injury.  Assessment:  48 year old man past history of polysubstance abuse presented with new onset seizure.  An LP was done which was negative for infection. His EEG showed generalized rhythmic delta activity and findings that probably suggested an end-stage/burnt out status epilepticus. He required heavy sedation to be able to maintain vent synchronization and was ultimately extubated yesterday. He has been awake and following some commands today. He is moving all 4 extremities without evidence of focal weakness.  This was probably a prolonged postictal state admixed with withdrawal due to polysubstance abuse, which is now resolving.  Recommendations: -Attempt to minimize sedation as much as possible. -I would imagine he would need CIWA protocol for withdrawal.  Management per PCCM. -From a seizure perspective: Continue Keppra and Continue Vimpat -Check ammonia level once again.  If still high, use lactulose -Maintain seizure precautions -Once he is ready for discharge, he should adhere to [facility] law for no driving for 6 months after having a seizure unless he is 6 months seizure-free. -At this  time, I do not think he can comprehend the driving restrictions.  This should be reiterated prior to discharge.  Neuro hospitalist service will be available as needed.  Please call us with questions. -- Amie Portland, MD [facility] Pager: [phone] If 7pm to 7am, please call on call as listed on AMION.  CRITICAL CARE ATTESTATION Performed by: Amie Portland, MD Total critical care time: 35 minutes Critical care time was exclusive of separately billable procedures and treating other patients and/or supervising APPs/Residents/Students Critical care was necessary to treat or prevent imminent or life-threatening deterioration due to status epilepticus (resolved) and toxic metabolic encephalopathy. This patient is critically ill and at significant risk for neurological worsening and/or death and care requires constant monitoring. Critical care was time spent personally by me on the following activities: development of treatment plan with patient and/or surrogate as well as nursing, discussions with consultants, evaluation of patient's response to treatment, examination of patient, obtaining history from patient or surrogate, ordering and performing treatments and interventions, ordering and review of laboratory studies, ordering and review of radiographic studies, pulse oximetry, re-evaluation of  patient's condition, participation in multidisciplinary rounds and medical decision making of high complexity in the care of this patient.

## 2018-01-23 NOTE — Progress Notes (Signed)
SLP Cancellation Note  Patient Details Name: ZARON ZWIEFELHOFER MRN: [id number] DOB: 07-07-1970   Cancelled treatment:       Reason Eval/Treat Not Completed: Medical issues which prohibited therapy . RN just administered Ativan. Plans are to wean from precedex, will follow up next date.  Aliene Altes 01/23/2018, 5:07 PM

## 2018-01-23 NOTE — Progress Notes (Signed)
PT Cancellation Note  Patient Details Name: Benjamin [name] MRN: [id number] DOB: 1970/10/13   Cancelled Treatment:    Reason Eval/Treat Not Completed: Medical issues which prohibited therapy(per RN actively [place], combative and RN requests hold til next date)   Lamarr Lulas 01/23/2018, 10:15 AM  Elwyn Reach, PT Acute Rehabilitation Services Pager: [phone] Office: [phone]

## 2018-01-23 NOTE — Progress Notes (Addendum)
NAME:  Benjamin [name], MRN:  [id number], DOB:  1970-04-05, LOS: 6 ADMISSION DATE:  01/17/2018, CONSULTATION DATE:  01/17/2018 REFERRING MD:  EDP, CHIEF COMPLAINT:  Confusion   Brief History   48 y/o male with polysubstance abuse admitted with acute encephalopathy and seizure, intubated in ER for airway protection.  Past Medical History  Polysubstance abuse, anxiety, chronic back pain, diveritulitis, GERD, Solitary kidney, spinal stenosis  Significant Hospital Events   12/30 admission, intubation 12/31 worrisome findings for recent status epilepticus, continuous EEG monitoring arranged, started propofol 1/4 Mental status improved, extubated  Consults:  Neurology  Procedures:  12/30 > intubated 12/31 lumbar puncture> no pleocytosis  Significant Diagnostic Tests:  CT head 12/30>Multifocal paranasal sinus disease with patient intubated. Brain parenchyma appears unremarkable. No mass or hemorrhage. EEG 12/31> worrisome for burst suppression, recent status epilepticus MRI Brain 1/2> No acute abnormality; no evidence of hypoxic ischemic injury  Micro Data:  Blood12/30 >No growth Sputum 12/30> MSAA CSF 12/31 > No growth  Antimicrobials:  12/30 Ceftriaxone > 1/3 dc'd 1/3 Cefazolin >    Interim history/subjective:  Extubated yesterday, remains on 4 L [place] Agitated overnight. Placed in 4 point restraints and started on low dose precedex. Mental status improved with sedation.    Objective   Blood pressure 115/74, pulse 78, temperature 98.2 F (36.8 C), resp. rate (!) 23, height 5\' 8"  (1.727 m), weight 84 kg, SpO2 95 %.    Vent Mode: CPAP;PSV FiO2 (%):  [40 %] 40 % PEEP:  [5 cmH20] 5 cmH20 Pressure Support:  [5 cmH20] 5 cmH20   Intake/Output Summary (Last 24 hours) at 01/23/2018 0659 Last data filed at 01/23/2018 0600 Gross per 24 hour  Intake 3131.2 ml  Output 3600 ml  Net -468.8 ml   Filed Weights   01/21/18 0500 01/22/18 0500 01/23/18 0456  Weight: 86.9 kg 87.6 kg  84 kg    Examination:  General:  In bed, alert, follows commands HENT: NCAT, [place] PULM: CTA B, expiratory wheezing CV: RRR, no mgr GI: BS+, soft, nontender MSK: normal bulk and tone Neuro: Alert on precedex, oriented to place    Resolved Hospital Problem list   AKI Ileus  Assessment & Plan:   Acute Hypoxic Respiratory Failure: Extubated 1/4  > Stable on [place]   Severe Sepsis due to RLL MSSA PNA:  > Day 7 of cefazolin, remains afebrile  > DC abx after today  Acute encephalopathy, seizure: Likely prolonged post ictal state plus withdrawal. No further seizure activity. Improved yesterday and extubated. Developed some agitation overnight. Concern for withdrawal. Improved on precedex.   > continue keppra, propofol, valproate per neurology > wean off precedex as able  Bradycardia: Resolved > Telemetry  Hypokalemia: Improved. 3.7 this AM > monitor BMET and Mg  Polysubstance Abuse: > monitor for withdrawal > CIWA   Best practice:  Diet: tube feeding Pain/Anxiety/Delirium protocol (if indicated): Versed, propofol, RASS goal -0 VAP protocol (if indicated): n/a DVT prophylaxis: SCDs GI prophylaxis: Protonix 40 mg q24 hours Glucose control: Daily labs Mobility: Strict bed rest Code Status: FULL Family Communication: None at bedside Disposition: Remain in ICU  Labs   CBC: Recent Labs  Lab 01/17/18 1128  01/17/18 2040 01/17/18 2354 01/19/18 0707 01/21/18 0947 01/23/18 0355  WBC 22.2*  --  12.3* 12.1* 9.9 6.9 9.0  NEUTROABS 18.8*  --  9.9*  --   --   --  5.6  HGB 19.0*   < > 17.6* 16.3 14.8 13.9 14.0  HCT 61.3*   < >  57.2* 52.1* 44.4 41.8 44.0  MCV 94.7  --  93.9 95.1 89.9 90.7 92.8  PLT 270  --  144* 109* 133* 150 158   < > = values in this interval not displayed.    Basic Metabolic Panel: Recent Labs  Lab 01/19/18 0707  01/20/18 0649 01/20/18 1427 01/21/18 0947 01/21/18 1440 01/21/18 1628 01/22/18 0220 01/23/18 0355  NA 145   < > 146* 146* 146* 145   --  143 144  K 2.6*   < > 2.5* 4.7 2.6* 3.5  --  2.9* 3.7  CL 109   < > 111 112* 109 112*  --  109 110  CO2 28   < > 26 24 27 24   --  24 25  GLUCOSE 103*   < > 139* 93 108* 125*  --  111* 77  BUN 11   < > 5* 6 <5* <5*  --  5* 7  CREATININE 1.31*   < > 1.07 1.16 1.04 1.00  --  0.87 0.85  CALCIUM 8.8*   < > 8.0* 7.7* 7.9* 8.2*  --  8.4* 8.5*  MG 2.1  --  1.9  --  1.8 2.5* 2.4 2.3  --   PHOS 1.2*  --   --   --   --  2.7 2.3* 3.1  --    < > = values in this interval not displayed.   GFR: Estimated Creatinine Clearance: 113.4 mL/min (by C-G formula based on SCr of 0.85 mg/dL). Recent Labs  Lab 01/17/18 1127  01/17/18 1553 01/17/18 2040 01/17/18 2354 01/19/18 0707 01/21/18 0947 01/23/18 0355  WBC  --    < >  --  12.3* 12.1* 9.9 6.9 9.0  LATICACIDVEN 4.83*  --  4.3* 2.6* 2.1*  --   --   --    < > = values in this interval not displayed.    Liver Function Tests: Recent Labs  Lab 01/17/18 1128  AST 32  ALT 34  ALKPHOS 72  BILITOT 0.9  PROT 7.9  ALBUMIN 4.4   No results for input(s): LIPASE, AMYLASE in the last 168 hours. Recent Labs  Lab 01/19/18 1145 01/21/18 1440  AMMONIA 45* 57*    ABG    Component Value Date/Time   PHART 7.301 (L) 01/18/2018 0557   PCO2ART 37.4 01/18/2018 0557   PO2ART 98.0 01/18/2018 0557   HCO3 18.3 (L) 01/18/2018 0557   TCO2 19 (L) 01/18/2018 0557   ACIDBASEDEF 7.0 (H) 01/18/2018 0557   O2SAT 97.0 01/18/2018 0557     Coagulation Profile: No results for input(s): INR, PROTIME in the last 168 hours.  Cardiac Enzymes: No results for input(s): CKTOTAL, CKMB, CKMBINDEX, TROPONINI in the last 168 hours.  HbA1C: No results found for: HGBA1C  CBG: Recent Labs  Lab 01/22/18 0356 01/22/18 0753 01/22/18 1236 01/22/18 1611 01/22/18 2332  GLUCAP 100* 111* 102* 97 93    Past Medical History  He,  has a past medical history of Acute stomach ulcer, Anxiety, Arthritis, Broken back (1980's), Chronic back pain, Depression, Diverticulitis of  duodenum, Duodenal diverticulum, GERD (gastroesophageal reflux disease), H/O clavicle fracture, Headache(784.0), IBS (irritable bowel syndrome), Solitary kidney, congenital, and Spinal stenosis.   Surgical History    Past Surgical History:  Procedure Laterality Date  . COLONOSCOPY N/A 05/16/2012   DQQ:IWLNL polyp-tubular adenoma. TI 10cm normal. Next TCS 04/2017.  Marland Kitchen COLONOSCOPY WITH PROPOFOL N/A 11/01/2017   Procedure: COLONOSCOPY WITH PROPOFOL;  Surgeon: Daneil Dolin, MD;  Location: [facility];  Service: Endoscopy;  Laterality: N/A;  10:00am  . ESOPHAGOGASTRODUODENOSCOPY N/A 09/29/2012   MIW:OEHOZY ulcerative/erosive reflux esophagitis. Hiatal hernia. Multiple duodenal bulbar ulcers. Negative H.pylori serology  . ESOPHAGOGASTRODUODENOSCOPY N/A 06/02/2013   Dr. Gala Romney: Mild erosive reflux esophagitis  . ESOPHAGOGASTRODUODENOSCOPY (EGD) WITH PROPOFOL N/A 11/01/2017   Procedure: ESOPHAGOGASTRODUODENOSCOPY (EGD) WITH PROPOFOL;  Surgeon: Daneil Dolin, MD;  Location: [facility];  Service: Endoscopy;  Laterality: N/A;  . POLYPECTOMY  11/01/2017   Procedure: POLYPECTOMY;  Surgeon: Daneil Dolin, MD;  Location: [facility];  Service: Endoscopy;;  colon     Social History   reports that he has been smoking cigarettes. He has a 30.00 pack-year smoking history. He has never used smokeless tobacco. He reports that he does not drink alcohol or use drugs.   Family History   His family history includes Diabetes in his mother; Heart failure in his mother; Hypertension in his mother. There is no history of Colon cancer.   Allergies No Known Allergies   Home Medications  Prior to Admission medications   Medication Sig Start Date End Date Taking? Authorizing Provider  buprenorphine (BUTRANS) 5 MCG/HR PTWK patch Place 5 mcg onto the skin once a week.   Yes [provider]  busPIRone (BUSPAR) 10 MG tablet Take 10 mg by mouth 3 (three) times daily.   Yes [provider]    CARAFATE 1 GM/10ML suspension TAKE 10 MLS BY MOUTH FOUR TIMES DAILY. Patient taking differently: Take 1 g by mouth 4 (four) times daily.  10/12/17  Yes Carlis Stable, NP  citalopram (CELEXA) 20 MG tablet Take 20 mg by mouth daily.   Yes [provider]  DEXILANT 60 MG capsule TAKE ONE CAPSULE BY MOUTH ONCE DAILY. Patient taking differently: Take 60 mg by mouth daily.  11/24/17  Yes Carlis Stable, NP  DULoxetine (CYMBALTA) 30 MG capsule Take 30 mg by mouth daily. Take 1 capsule twice daily   Yes [provider]  haloperidol (HALDOL) 5 MG tablet Take 5 mg by mouth at bedtime.   Yes [provider]  traZODone (DESYREL) 50 MG tablet Take 50-100 mg by mouth at bedtime as needed for sleep.    Yes [provider]  albuterol (PROVENTIL HFA;VENTOLIN HFA) 108 (90 Base) MCG/ACT inhaler Inhale 3 puffs into the lungs every 4 (four) hours as needed for wheezing or shortness of breath.     [provider]  Menthol, Topical Analgesic, (ABSORBINE PAIN RELIEVING EX) Apply 1 patch topically daily as needed (for pain).    [provider]     Critical care time:     Velna Ochs, M.D. - PGY3 Pager: [phone] 01/23/2018, 6:59 AM

## 2018-01-24 LAB — BASIC METABOLIC PANEL
Anion gap: 9 (ref 5–15)
BUN: 15 mg/dL (ref 6–20)
CO2: 22 mmol/L (ref 22–32)
Calcium: 8.6 mg/dL — ABNORMAL LOW (ref 8.9–10.3)
Chloride: 111 mmol/L (ref 98–111)
Creatinine, Ser: 0.93 mg/dL (ref 0.61–1.24)
GFR calc Af Amer: >60 mL/min (ref >60)
GFR calc non Af Amer: >60 mL/min (ref >60)
Glucose, Bld: 86 mg/dL (ref 70–99)
Potassium: 3.5 mmol/L (ref 3.5–5.1)
Sodium: 142 mmol/L (ref 135–145)

## 2018-01-24 LAB — GLUCOSE, CAPILLARY
GLUCOSE-CAPILLARY: 116 mg/dL — AB (ref 70–99)
Glucose-Capillary: 109 mg/dL — ABNORMAL HIGH (ref 70–99)
Glucose-Capillary: 69 mg/dL — ABNORMAL LOW (ref 70–99)
Glucose-Capillary: 85 mg/dL (ref 70–99)

## 2018-01-24 LAB — TRIGLYCERIDES: Triglycerides: 283 mg/dL — ABNORMAL HIGH (ref <150)

## 2018-01-24 MED ORDER — DEXTROSE 50 % IV SOLN
12.5000 g | Freq: Once | INTRAVENOUS | Status: AC
  Administered 2018-01-24: 12.5 g via INTRAVENOUS

## 2018-01-24 MED ORDER — FENTANYL CITRATE (PF) 100 MCG/2ML IJ SOLN
25.0000 ug | Freq: Once | INTRAMUSCULAR | Status: AC
  Administered 2018-01-24: 25 ug via INTRAVENOUS
  Filled 2018-01-24: qty 2

## 2018-01-24 MED ORDER — FENTANYL CITRATE (PF) 100 MCG/2ML IJ SOLN: 25.0000 ug | INTRAMUSCULAR | Status: DC | PRN

## 2018-01-24 MED ORDER — SODIUM CHLORIDE 0.9 % IV SOLN: INTRAVENOUS | Status: DC

## 2018-01-24 MED ORDER — SODIUM CHLORIDE 0.9% FLUSH
10.0000 mL | Freq: Two times a day (BID) | INTRAVENOUS | Status: DC
  Administered 2018-01-24 – 2018-01-29 (×9): 10 mL
  Administered 2018-01-30: 8 mL
  Administered 2018-01-31: 10 mL

## 2018-01-24 MED ORDER — DEXTROSE-NACL 5-0.45 % IV SOLN
INTRAVENOUS | Status: DC
  Administered 2018-01-24 – 2018-01-27 (×6): via INTRAVENOUS

## 2018-01-24 MED ORDER — DEXTROSE 50 % IV SOLN
INTRAVENOUS | Status: AC
  Administered 2018-01-24: 12.5 g via INTRAVENOUS
  Filled 2018-01-24: qty 50

## 2018-01-24 MED ORDER — SODIUM CHLORIDE 0.9% FLUSH: 10.0000 mL | INTRAVENOUS | Status: DC | PRN

## 2018-01-24 NOTE — Progress Notes (Signed)
Initial Nutrition Assessment  DOCUMENTATION CODES:   Not applicable  INTERVENTION:   If unable to extubate within 24-48 hours, recommend insertion of Cortrak with initiation of TF  Tube feeding:  Vital 1.5 at 60 ml/hr Pro-Stat 30 mL daily Provides 2160 kcals, 113 g of protein, 1094 mL of free water Meets 100% estimated calorie and protein needs   NUTRITION DIAGNOSIS:   Inadequate oral intake related to acute illness as evidenced by NPO status.  GOAL:   Patient will meet greater than or equal to 90% of their needs   MONITOR:   Vent status, TF tolerance, Labs, Weight trends  REASON FOR ASSESSMENT:   Ventilator    ASSESSMENT:    48 yo male admitted with AMS and new onset seizures requiring intubation, concern for meningitis/encephaliti. +cocaine, THC, opiates. PMH includes spinal stenosis, diverticulitis, gastric ulcer, IBS   1/03 TF started 1/04 Extubated  Pt received Tube Feeding for 1 day prior to extubation. Otherwise NPO since admission (NPO from 12/30 to 12/03 and then from 12/04 to present time) SLP following but unable to advance diet due to mentation.   Discussed possibility of Cortrak insertion with initiation of TF with CCM today. Team wanting to wait 24 hours to see if mentation improves and if able to advance diet. If not, plan to place NG tube  Labs: reviewed Meds: D5-1/2 NS at 100 ml/hr, precedex   Diet Order:   Diet Order            Diet NPO time specified  Diet effective now              EDUCATION NEEDS:   Not appropriate for education at this time  Skin:  Skin Assessment: Reviewed RN Assessment  Last BM:  1/6  Height:   Ht Readings from Last 1 Encounters:  01/17/18 5\' 8"  (1.727 m)    Weight:   Wt Readings from Last 1 Encounters:  01/24/18 84 kg    Ideal Body Weight:  70 kg  BMI:  Body mass index is 28.16 kg/m.  Estimated Nutritional Needs:   Kcal:  2100-2400 kcals   Protein:  105-120  Fluid:  >/= 2 L   Kerman Passey MS, RD, LDN, CNSC [phone] Pager  [phone] Weekend/On-Call Pager

## 2018-01-24 NOTE — Progress Notes (Signed)
PT Cancellation Note  Patient Details Name: Benjamin [name] MRN: [id number] DOB: 01/06/1971   Cancelled Treatment:    Reason Eval/Treat Not Completed: Patient's level of consciousness.  Pt given ativan this AM.  Pt to check back tomorrow.  Thanks,  Benjamin [name]. Benjamin [name], PT, DPT  Acute Rehabilitation [phone] pager [phone]) [phone] office     Benjamin [name] 01/24/2018, 10:47 AM

## 2018-01-24 NOTE — Progress Notes (Signed)
48 year old man with a history of polysubstance abuse admitted 12/30 with seizure and acute encephalopathy, intubated for airway protection Head CT was negative, EEG was worrisome for burst suppression and he was placed on IV propofol, CSF was negative.  MRI 1/2 did not show any evidence of brain injury  He was extubated 1/4 but remains encephalopathic He was placed on standing Ativan on 1/5 On exam this morning on rounds-somnolent, follows one-step commands, coughs on command, coarse breath sounds bilateral, soft nontender abdomen, S1-S2 normal  Labs reviewed which show mild hypokalemia, no leukocytosis, mild thrombocytopenia Chest x-ray 1/6 shows right basal atelectasis/infiltrate, personally reviewed  Impression/plan  Acute encephalopathy -he has required 6 mg of Ativan over the last 24 hours for breakthrough agitation in spite of low-dose Precedex.  We will keep Ativan standing dose at 1 mg every 8. Continue Keppra and valproate per neurology-seizures attributed to alcohol withdrawal and not clear whether he will need this long-term  Right lower lobe MSSA pneumonia-treated with 7 days of antibiotics, acute respiratory failure is resolved  Hypokalemia will be repleted  My independent critical care time x 55m  Kara Mead MD. FCCP. [facility] Pager [phone] If no response call [phone]   01/24/2018

## 2018-01-24 NOTE — Progress Notes (Signed)
NAME:  Benjamin [name], MRN:  [id number], DOB:  03/24/1970, LOS: 7 ADMISSION DATE:  01/17/2018, CONSULTATION DATE:  01/17/2018 REFERRING MD:  EDP, CHIEF COMPLAINT:  Confusion   Brief History   48 y/o male with polysubstance abuse admitted with acute encephalopathy and seizure, intubated in ER for airway protection.  Past Medical History  Polysubstance abuse, anxiety, chronic back pain, diveritulitis, GERD, Solitary kidney, spinal stenosis  Significant Hospital Events   12/30 admission, intubation 12/31 worrisome findings for recent status epilepticus, continuous EEG monitoring arranged, started propofol 1/4 Mental status improved, extubated  Consults:  Neurology  Procedures:  12/30 > intubated 12/31 lumbar puncture> no pleocytosis  Significant Diagnostic Tests:   CT head 12/30>Multifocal paranasal sinus disease with patient intubated. Brain parenchyma appears unremarkable. No mass or hemorrhage.  EEG 12/31> worrisome for burst suppression, recent status epilepticus  MRI Brain 1/2> No acute abnormality; no evidence of hypoxic ischemic injury  Micro Data:  Blood12/30 >No growth Sputum 12/30> MSAA CSF 12/31 > No growth  Antimicrobials:  12/30 Ceftriaxone > 1/3 dc'd 1/3 Cefazolin > Completed 1/5   Interim history/subjective:  Per RN, agitation and mentation much improved overnight with scheduled ativan. Weaning precedex. Sleepy this morning on my exam, opens eyes to touch.    Objective   Blood pressure 111/78, pulse 63, temperature 98.5 F (36.9 C), temperature source Oral, resp. rate (!) 27, height 5\' 8"  (1.727 m), weight 84 kg, SpO2 95 %.        Intake/Output Summary (Last 24 hours) at 01/24/2018 0643 Last data filed at 01/24/2018 0500 Gross per 24 hour  Intake 899.56 ml  Output 550 ml  Net 349.56 ml   Filed Weights   01/21/18 0500 01/22/18 0500 01/23/18 0456  Weight: 86.9 kg 87.6 kg 84 kg    Examination:  General:  In bed, sleeping comfortably HENT:  NCAT, [place] PULM: CTA B, diffuse rhonchi  CV: RRR, no mgr GI: BS+, soft, nontender MSK: normal bulk and tone Neuro: Alert to touch, quickly falls back asleep     Resolved Hospital Problem list   AKI Ileus  Assessment & Plan:   Acute encephalopathy, seizure: Improving. Likely prolonged post ictal state plus withdrawal. No further seizure activity.  > Continue ativan 1mg  q8h  > wean precedex as able > continue keppra, propofol, valproate per neurology  Acute Hypoxic Respiratory Failure: Extubated 1/4  > Stable on [place]   Severe Sepsis due to RLL MSSA PNA:  > S/p 7 days abx (ceftriaxone -> cefazolin)  Bradycardia: Resolved > Telemetry  Hypokalemia: replete prn  > monitor BMET and Mg  Polysubstance Abuse: > monitor for withdrawal > CIWA  Best practice:  Diet: tube feeding Pain/Anxiety/Delirium protocol (if indicated): Versed, propofol, RASS goal -0 VAP protocol (if indicated): n/a DVT prophylaxis: SCDs GI prophylaxis: Protonix 40 mg q24 hours Glucose control: Daily labs Mobility: Strict bed rest Code Status: FULL Family Communication: None at bedside Disposition: [facility] if off precedex   Labs   CBC: Recent Labs  Lab 01/17/18 1128  01/17/18 2040 01/17/18 2354 01/19/18 0707 01/21/18 0947 01/23/18 0355  WBC 22.2*  --  12.3* 12.1* 9.9 6.9 9.0  NEUTROABS 18.8*  --  9.9*  --   --   --  5.6  HGB 19.0*   < > 17.6* 16.3 14.8 13.9 14.0  HCT 61.3*   < > 57.2* 52.1* 44.4 41.8 44.0  MCV 94.7  --  93.9 95.1 89.9 90.7 92.8  PLT 270  --  144* 109* 133* 150 158   < > = values in this interval not displayed.    Basic Metabolic Panel: Recent Labs  Lab 01/19/18 0707  01/20/18 0649 01/20/18 1427 01/21/18 0947 01/21/18 1440 01/21/18 1628 01/22/18 0220 01/23/18 0355  NA 145   < > 146* 146* 146* 145  --  143 144  K 2.6*   < > 2.5* 4.7 2.6* 3.5  --  2.9* 3.7  CL 109   < > 111 112* 109 112*  --  109 110  CO2 28   < > 26 24 27 24   --  24 25  GLUCOSE 103*   < >  139* 93 108* 125*  --  111* 77  BUN 11   < > 5* 6 <5* <5*  --  5* 7  CREATININE 1.31*   < > 1.07 1.16 1.04 1.00  --  0.87 0.85  CALCIUM 8.8*   < > 8.0* 7.7* 7.9* 8.2*  --  8.4* 8.5*  MG 2.1  --  1.9  --  1.8 2.5* 2.4 2.3  --   PHOS 1.2*  --   --   --   --  2.7 2.3* 3.1  --    < > = values in this interval not displayed.   GFR: Estimated Creatinine Clearance: 113.4 mL/min (by C-G formula based on SCr of 0.85 mg/dL). Recent Labs  Lab 01/17/18 1127  01/17/18 1553 01/17/18 2040 01/17/18 2354 01/19/18 0707 01/21/18 0947 01/23/18 0355  WBC  --    < >  --  12.3* 12.1* 9.9 6.9 9.0  LATICACIDVEN 4.83*  --  4.3* 2.6* 2.1*  --   --   --    < > = values in this interval not displayed.    Liver Function Tests: Recent Labs  Lab 01/17/18 1128  AST 32  ALT 34  ALKPHOS 72  BILITOT 0.9  PROT 7.9  ALBUMIN 4.4   No results for input(s): LIPASE, AMYLASE in the last 168 hours. Recent Labs  Lab 01/19/18 1145 01/21/18 1440  AMMONIA 45* 57*    ABG    Component Value Date/Time   PHART 7.301 (L) 01/18/2018 0557   PCO2ART 37.4 01/18/2018 0557   PO2ART 98.0 01/18/2018 0557   HCO3 18.3 (L) 01/18/2018 0557   TCO2 19 (L) 01/18/2018 0557   ACIDBASEDEF 7.0 (H) 01/18/2018 0557   O2SAT 97.0 01/18/2018 0557     Coagulation Profile: No results for input(s): INR, PROTIME in the last 168 hours.  Cardiac Enzymes: No results for input(s): CKTOTAL, CKMB, CKMBINDEX, TROPONINI in the last 168 hours.  HbA1C: No results found for: HGBA1C  CBG: Recent Labs  Lab 01/22/18 0753 01/22/18 1236 01/22/18 1611 01/22/18 2332 01/23/18 1146  GLUCAP 111* 102* 97 93 82    Past Medical History  He,  has a past medical history of Acute stomach ulcer, Anxiety, Arthritis, Broken back (1980's), Chronic back pain, Depression, Diverticulitis of duodenum, Duodenal diverticulum, GERD (gastroesophageal reflux disease), H/O clavicle fracture, Headache(784.0), IBS (irritable bowel syndrome), Solitary kidney,  congenital, and Spinal stenosis.   Surgical History    Past Surgical History:  Procedure Laterality Date  . COLONOSCOPY N/A 05/16/2012   ZRA:QTMAU polyp-tubular adenoma. TI 10cm normal. Next TCS 04/2017.  Marland Kitchen COLONOSCOPY WITH PROPOFOL N/A 11/01/2017   Procedure: COLONOSCOPY WITH PROPOFOL;  Surgeon: Daneil Dolin, MD;  [facility];  Service: Endoscopy;  Laterality: N/A;  10:00am  . ESOPHAGOGASTRODUODENOSCOPY N/A 09/29/2012   QJF:HLKTGY ulcerative/erosive reflux  esophagitis. Hiatal hernia. Multiple duodenal bulbar ulcers. Negative H.pylori serology  . ESOPHAGOGASTRODUODENOSCOPY N/A 06/02/2013   Dr. Gala Romney: Mild erosive reflux esophagitis  . ESOPHAGOGASTRODUODENOSCOPY (EGD) WITH PROPOFOL N/A 11/01/2017   Procedure: ESOPHAGOGASTRODUODENOSCOPY (EGD) WITH PROPOFOL;  Surgeon: Daneil Dolin, MD;  [facility];  Service: Endoscopy;  Laterality: N/A;  . POLYPECTOMY  11/01/2017   Procedure: POLYPECTOMY;  Surgeon: Daneil Dolin, MD;  [facility];  Service: Endoscopy;;  colon     Social History   reports that he has been smoking cigarettes. He has a 30.00 pack-year smoking history. He has never used smokeless tobacco. He reports that he does not drink alcohol or use drugs.   Family History   His family history includes Diabetes in his mother; Heart failure in his mother; Hypertension in his mother. There is no history of Colon cancer.   Allergies No Known Allergies   Home Medications  Prior to Admission medications   Medication Sig Start Date End Date Taking? Authorizing Provider  buprenorphine (BUTRANS) 5 MCG/HR PTWK patch Place 5 mcg onto the skin once a week.   Yes [provider]  busPIRone (BUSPAR) 10 MG tablet Take 10 mg by mouth 3 (three) times daily.   Yes [provider]  CARAFATE 1 GM/10ML suspension TAKE 10 MLS BY MOUTH FOUR TIMES DAILY. Patient taking differently: Take 1 g by mouth 4 (four) times daily.  10/12/17  Yes Carlis Stable,  NP  citalopram (CELEXA) 20 MG tablet Take 20 mg by mouth daily.   Yes [provider]  DEXILANT 60 MG capsule TAKE ONE CAPSULE BY MOUTH ONCE DAILY. Patient taking differently: Take 60 mg by mouth daily.  11/24/17  Yes Carlis Stable, NP  DULoxetine (CYMBALTA) 30 MG capsule Take 30 mg by mouth daily. Take 1 capsule twice daily   Yes [provider]  haloperidol (HALDOL) 5 MG tablet Take 5 mg by mouth at bedtime.   Yes [provider]  traZODone (DESYREL) 50 MG tablet Take 50-100 mg by mouth at bedtime as needed for sleep.    Yes [provider]  albuterol (PROVENTIL HFA;VENTOLIN HFA) 108 (90 Base) MCG/ACT inhaler Inhale 3 puffs into the lungs every 4 (four) hours as needed for wheezing or shortness of breath.     [provider]  Menthol, Topical Analgesic, (ABSORBINE PAIN RELIEVING EX) Apply 1 patch topically daily as needed (for pain).    [provider]     Critical care time:     Velna Ochs, M.D. - PGY3 Pager: [phone] 01/24/2018, 6:43 AM

## 2018-01-24 NOTE — Progress Notes (Signed)
SLP Cancellation Note  Patient Details Name: Benjamin [name] MRN: [id number] DOB: 16-Sep-1970   Cancelled treatment:       Reason Eval/Treat Not Completed: Medical issues which prohibited therapy. Per RN, still not appropriate for swallow evaluation. Recently received ativan. Will f/u as able.   Germain Osgood 01/24/2018, 9:41 AM  Germain Osgood, M.A. [place] Acute Environmental education officer [phone] Office [phone]

## 2018-01-24 NOTE — Evaluation (Signed)
Occupational Therapy Evaluation Patient Details Name: Benjamin [name] MRN: [id number] DOB: 17-Sep-1970 Today's Date: 01/24/2018    History of Present Illness 48 y/o male admitted with encephalopathy with Sz. Intubated 12/30-1/4. PMhx: polysubstance abuse, depression, GERD, solitary kidney, bad back with pain   Clinical Impression   This 48 yo male admitted with above presents to acute OT with increased confusion, decreased command following, decreased mobility, decreased movement of all extremities all affecting his safety and independence with basic ADLs. Pta pt was totally independent with basic ADLs. He will continue to benefit from acute OT with follow up OT at SNF.     Follow Up Recommendations  SNF    Equipment Recommendations  Other (comment)(TBD next venue)       Precautions / Restrictions Precautions Precautions: Fall Restrictions Weight Bearing Restrictions: No      Mobility Bed Mobility Overal bed mobility: Needs Assistance Bed Mobility: Rolling Rolling: Max assist                   ADL either performed or assessed with clinical judgement   ADL Overall ADL's : Needs assistance/impaired                                       General ADL Comments: total A at this time, placed warm wash cloth on patients face-he intitated reaching for it with RUE but did not do more than that-I had to remove washcloth     Vision Baseline Vision/History: Wears glasses Wears Glasses: At all times Patient Visual Report: (says he can't see when asked--glasses not here (mom to bring them in and have him use them when she is here, but will take them home when she leaves so they don't get lost))              Pertinent Vitals/Pain Pain Assessment: Faces Faces Pain Scale: Hurts little more Pain Location: back (chronic) Pain Descriptors / Indicators: Grimacing Pain Intervention(s): Limited activity within patient's tolerance;Monitored during session      Hand Dominance Right   Extremity/Trunk Assessment Upper Extremity Assessment Upper Extremity Assessment: Generalized weakness(moving LUE more than LUE spontaenously and some on command)           Communication Communication Communication: No difficulties   Cognition Arousal/Alertness: Lethargic;Suspect due to medications Behavior During Therapy: Restless;Agitated Overall Cognitive Status: Impaired/Different from baseline Area of Impairment: Attention;Following commands;Safety/judgement;Problem solving                 Orientation Level: Disoriented to(building intially, remembered at end of session, able to tell me [facility], able to tell me the street he lives on , able to tell me that it was his mom and dad in room with him) Current Attention Level: Focused   Following Commands: Follows one step commands inconsistently;Follows one step commands with increased time Safety/Judgement: Decreased awareness of safety;Decreased awareness of deficits   Problem Solving: Slow processing;Decreased initiation;Difficulty sequencing;Requires verbal cues;Requires tactile cues General Comments: garbled speech; asking to go home              [place] expects to be discharged to:: Skilled nursing facility Living Arrangements: Parent Available Help at Discharge: Family;Available 24 hours/day Type of Home: House Home Access: Stairs to enter [employer] of Steps: 3 Entrance Stairs-Rails: None Home Layout: One level     Bathroom Shower/Tub: Occupational psychologist: Handicapped height  Home Equipment: Shower seat          Prior Functioning/Environment Level of Independence: Independent        Comments: per mother pt does not do much other than his basic ADLs due to bad back        OT Problem List: Decreased strength;Decreased range of motion;Impaired balance (sitting and/or standing);Decreased activity  tolerance;Pain;Decreased knowledge of use of DME or AE;Impaired UE functional use;Decreased safety awareness;Decreased cognition;Decreased coordination      OT Treatment/Interventions: Self-care/ADL training;Balance training;Neuromuscular education;DME and/or AE instruction;Patient/family education;Cognitive remediation/compensation;Therapeutic activities    OT Goals(Current goals can be found in the care plan section) Acute Rehab OT Goals Patient Stated Goal: to go home OT Goal Formulation: With family Time For Goal Achievement: 02/07/18 Potential to Achieve Goals: Fair  OT Frequency: Min 2X/week              AM-PAC OT "6 Clicks" Daily Activity     Outcome Measure Help from another person eating meals?: Total Help from another person taking care of personal grooming?: Total Help from another person toileting, which includes using toliet, bedpan, or urinal?: Total Help from another person bathing (including washing, rinsing, drying)?: Total Help from another person to put on and taking off regular upper body clothing?: Total Help from another person to put on and taking off regular lower body clothing?: Total 6 Click Score: 6   End of Session Nurse Communication: (pt answering some orientation questions)  Activity Tolerance: Treatment limited secondary to agitation Patient left: in bed;with bed alarm set;with family/visitor present;with restraints reapplied(Bil wrist )  OT Visit Diagnosis: Other abnormalities of gait and mobility (R26.89);Muscle weakness (generalized) (M62.81);Other symptoms and signs involving cognitive function;Pain Pain - part of body: (back)                Time: 2694-8546 OT Time Calculation (min): 19 min Charges:  OT General Charges $OT Visit: 1 Visit OT Evaluation $OT Eval Low Complexity: 1 Low OT Treatments $Self Care/Home Management : 8-22 mins  [place] [address], OTR/L Acute [employer] Pager [phone] Office [phone]     Almon Register 01/24/2018, 2:16 PM

## 2018-01-24 NOTE — Progress Notes (Signed)
Dear Doctor [place] Bells: This patient has been identified as a candidate for PICC for the following reason (s): IV therapy over 48 hours and incompatible drugs (aminophyllin, TPN, heparin, given with an antibiotic) If you agree, please write an order for the indicated device. For any questions contact the Vascular Access Team at [phone] if no answer, please leave a message.  Thank you for supporting the early vascular access assessment program.

## 2018-01-25 LAB — BASIC METABOLIC PANEL
Anion gap: 7 (ref 5–15)
BUN: 9 mg/dL (ref 6–20)
CALCIUM: 8.5 mg/dL — AB (ref 8.9–10.3)
CO2: 23 mmol/L (ref 22–32)
CREATININE: 0.76 mg/dL (ref 0.61–1.24)
Chloride: 111 mmol/L (ref 98–111)
GFR calc Af Amer: >60 mL/min (ref >60)
GFR calc non Af Amer: >60 mL/min (ref >60)
Glucose, Bld: 124 mg/dL — ABNORMAL HIGH (ref 70–99)
Potassium: 3.8 mmol/L (ref 3.5–5.1)
Sodium: 141 mmol/L (ref 135–145)

## 2018-01-25 LAB — GLUCOSE, CAPILLARY
Glucose-Capillary: 109 mg/dL — ABNORMAL HIGH (ref 70–99)
Glucose-Capillary: 113 mg/dL — ABNORMAL HIGH (ref 70–99)
Glucose-Capillary: 96 mg/dL (ref 70–99)
Glucose-Capillary: 95 mg/dL (ref 70–99)
Glucose-Capillary: 114 mg/dL — ABNORMAL HIGH (ref 70–99)

## 2018-01-25 MED ORDER — HALOPERIDOL LACTATE 5 MG/ML IJ SOLN
2.0000 mg | INTRAMUSCULAR | Status: DC | PRN
  Administered 2018-01-25: 4 mg via INTRAVENOUS
  Administered 2018-01-26: 3 mg via INTRAVENOUS
  Administered 2018-01-26 – 2018-01-30 (×4): 4 mg via INTRAVENOUS
  Administered 2018-01-31: 2 mg via INTRAVENOUS
  Filled 2018-01-25 (×8): qty 1

## 2018-01-25 MED ORDER — MORPHINE SULFATE (PF) 2 MG/ML IV SOLN
0.5000 mg | Freq: Two times a day (BID) | INTRAVENOUS | Status: DC
  Administered 2018-01-25 – 2018-01-26 (×4): 0.5 mg via INTRAVENOUS
  Filled 2018-01-25 (×5): qty 1

## 2018-01-25 NOTE — Progress Notes (Signed)
NAME:  Benjamin [name], MRN:  [id number], DOB:  Dec 05, 1970, LOS: 8 ADMISSION DATE:  01/17/2018, CONSULTATION DATE:  01/17/2018 REFERRING MD:  EDP, CHIEF COMPLAINT:  Confusion   Brief History   48 y/o male with polysubstance abuse admitted with acute encephalopathy and seizure, intubated in ER for airway protection.  Past Medical History  Polysubstance abuse, anxiety, chronic back pain, diveritulitis, GERD, Solitary kidney, spinal stenosis  Significant Hospital Events   12/30 admission, intubation 12/31 worrisome findings for recent status epilepticus, continuous EEG monitoring arranged, started propofol 1/4 Mental status improved, extubated  Consults:  Neurology  Procedures:  12/30 > intubated 12/31 lumbar puncture> no pleocytosis  Significant Diagnostic Tests:   CT head 12/30>Multifocal paranasal sinus disease with patient intubated. Brain parenchyma appears unremarkable. No mass or hemorrhage.  EEG 12/31> worrisome for burst suppression, recent status epilepticus  MRI Brain 1/2> No acute abnormality; no evidence of hypoxic ischemic injury  Micro Data:  Blood12/30 >No growth Sputum 12/30> MSAA CSF 12/31 > No growth  Antimicrobials:  12/30 Ceftriaxone > 1/3 dc'd 1/3 Cefazolin > Completed 1/5   Interim history/subjective:  Sedated, unresponsive, 4 point restraints.    Objective   Blood pressure 118/73, pulse (!) 59, temperature 98 F (36.7 C), temperature source Axillary, resp. rate (!) 27, height 5\' 8"  (1.727 m), weight 83.9 kg, SpO2 97 %.        Intake/Output Summary (Last 24 hours) at 01/25/2018 2751 Last data filed at 01/25/2018 0600 Gross per 24 hour  Intake 2645.77 ml  Output 1275 ml  Net 1370.77 ml   Filed Weights   01/23/18 0456 01/24/18 0500 01/25/18 0500  Weight: 84 kg 84 kg 83.9 kg    Examination:  General: Appears comfortable, non toxic HENT: NCAT, [place] PULM: Diffuse rhonchi, no wheezing  CV: RRR, no mgr GI: BS+, soft, nontender MSK:  normal bulk and tone Neuro: Sedated, unresponsive  Resolved Hospital Problem list   AKI Ileus Sepsis due to RLL PNA Bradycardia  Assessment & Plan:   Acute encephalopathy, seizure: Initically concern for prolonged post ictal state. Now likely withdrawal syndrome plus medical delirium. No further seizure activity.  > CIWA > Continue ativan 1mg  q8h  > Added PRN fentanyl yesterday (has not required) > wean precedex as able > continue keppra & valproate per neurology  Acute Hypoxic Respiratory Failure: Extubated 1/4  > Stable on [place]  Hypokalemia: replete prn  > monitor BMET and Mg  Polysubstance Abuse: > monitor for withdrawal > CIWA  Best practice:  Diet: tube feeding Pain/Anxiety/Delirium protocol (if indicated): Versed, propofol, RASS goal -0 VAP protocol (if indicated): n/a DVT prophylaxis: SCDs GI prophylaxis: Protonix 40 mg q24 hours Glucose control: Daily labs Mobility: Strict bed rest Code Status: FULL Family Communication: None at bedside Disposition: Remain in ICU  Labs   CBC: Recent Labs  Lab 01/19/18 0707 01/21/18 0947 01/23/18 0355  WBC 9.9 6.9 9.0  NEUTROABS  --   --  5.6  HGB 14.8 13.9 14.0  HCT 44.4 41.8 44.0  MCV 89.9 90.7 92.8  PLT 133* 150 700    Basic Metabolic Panel: Recent Labs  Lab 01/19/18 0707  01/20/18 0649  01/21/18 0947 01/21/18 1440 01/21/18 1628 01/22/18 0220 01/23/18 0355 01/24/18 0631  NA 145   < > 146*   < > 146* 145  --  143 144 142  K 2.6*   < > 2.5*   < > 2.6* 3.5  --  2.9* 3.7 3.5  CL 109   < >  111   < > 109 112*  --  109 110 111  CO2 28   < > 26   < > 27 24  --  24 25 22   GLUCOSE 103*   < > 139*   < > 108* 125*  --  111* 77 86  BUN 11   < > 5*   < > <5* <5*  --  5* 7 15  CREATININE 1.31*   < > 1.07   < > 1.04 1.00  --  0.87 0.85 0.93  CALCIUM 8.8*   < > 8.0*   < > 7.9* 8.2*  --  8.4* 8.5* 8.6*  MG 2.1  --  1.9  --  1.8 2.5* 2.4 2.3  --   --   PHOS 1.2*  --   --   --   --  2.7 2.3* 3.1  --   --    < > = values  in this interval not displayed.   GFR: Estimated Creatinine Clearance: 103.6 mL/min (by C-G formula based on SCr of 0.93 mg/dL). Recent Labs  Lab 01/19/18 0707 01/21/18 0947 01/23/18 0355  WBC 9.9 6.9 9.0    Liver Function Tests: No results for input(s): AST, ALT, ALKPHOS, BILITOT, PROT, ALBUMIN in the last 168 hours. No results for input(s): LIPASE, AMYLASE in the last 168 hours. Recent Labs  Lab 01/19/18 1145 01/21/18 1440  AMMONIA 45* 57*    ABG    Component Value Date/Time   PHART 7.301 (L) 01/18/2018 0557   PCO2ART 37.4 01/18/2018 0557   PO2ART 98.0 01/18/2018 0557   HCO3 18.3 (L) 01/18/2018 0557   TCO2 19 (L) 01/18/2018 0557   ACIDBASEDEF 7.0 (H) 01/18/2018 0557   O2SAT 97.0 01/18/2018 0557     Coagulation Profile: No results for input(s): INR, PROTIME in the last 168 hours.  Cardiac Enzymes: No results for input(s): CKTOTAL, CKMB, CKMBINDEX, TROPONINI in the last 168 hours.  HbA1C: No results found for: HGBA1C  CBG: Recent Labs  Lab 01/24/18 1132 01/24/18 1540 01/24/18 1939 01/24/18 2335 01/25/18 0416  GLUCAP 69* 85 116* 109* 109*    Past Medical History  He,  has a past medical history of Acute stomach ulcer, Anxiety, Arthritis, Broken back (1980's), Chronic back pain, Depression, Diverticulitis of duodenum, Duodenal diverticulum, GERD (gastroesophageal reflux disease), H/O clavicle fracture, Headache(784.0), IBS (irritable bowel syndrome), Solitary kidney, congenital, and Spinal stenosis.   Surgical History    Past Surgical History:  Procedure Laterality Date  . COLONOSCOPY N/A 05/16/2012   PFX:TKWIO polyp-tubular adenoma. TI 10cm normal. Next TCS 04/2017.  Marland Kitchen COLONOSCOPY WITH PROPOFOL N/A 11/01/2017   Procedure: COLONOSCOPY WITH PROPOFOL;  Surgeon: Daneil Dolin, MD;  [facility];  Service: Endoscopy;  Laterality: N/A;  10:00am  . ESOPHAGOGASTRODUODENOSCOPY N/A 09/29/2012   XBD:ZHGDJM ulcerative/erosive reflux esophagitis. Hiatal  hernia. Multiple duodenal bulbar ulcers. Negative H.pylori serology  . ESOPHAGOGASTRODUODENOSCOPY N/A 06/02/2013   Dr. Gala Romney: Mild erosive reflux esophagitis  . ESOPHAGOGASTRODUODENOSCOPY (EGD) WITH PROPOFOL N/A 11/01/2017   Procedure: ESOPHAGOGASTRODUODENOSCOPY (EGD) WITH PROPOFOL;  Surgeon: Daneil Dolin, MD;  [facility];  Service: Endoscopy;  Laterality: N/A;  . POLYPECTOMY  11/01/2017   Procedure: POLYPECTOMY;  Surgeon: Daneil Dolin, MD;  [facility];  Service: Endoscopy;;  colon     Social History   reports that he has been smoking cigarettes. He has a 30.00 pack-year smoking history. He has never used smokeless tobacco. He reports that he does  not drink alcohol or use drugs.   Family History   His family history includes Diabetes in his mother; Heart failure in his mother; Hypertension in his mother. There is no history of Colon cancer.   Allergies No Known Allergies   Home Medications  Prior to Admission medications   Medication Sig Start Date End Date Taking? Authorizing Provider  buprenorphine (BUTRANS) 5 MCG/HR PTWK patch Place 5 mcg onto the skin once a week.   Yes [provider]  busPIRone (BUSPAR) 10 MG tablet Take 10 mg by mouth 3 (three) times daily.   Yes [provider]  CARAFATE 1 GM/10ML suspension TAKE 10 MLS BY MOUTH FOUR TIMES DAILY. Patient taking differently: Take 1 g by mouth 4 (four) times daily.  10/12/17  Yes Carlis Stable, NP  citalopram (CELEXA) 20 MG tablet Take 20 mg by mouth daily.   Yes [provider]  DEXILANT 60 MG capsule TAKE ONE CAPSULE BY MOUTH ONCE DAILY. Patient taking differently: Take 60 mg by mouth daily.  11/24/17  Yes Carlis Stable, NP  DULoxetine (CYMBALTA) 30 MG capsule Take 30 mg by mouth daily. Take 1 capsule twice daily   Yes [provider]  haloperidol (HALDOL) 5 MG tablet Take 5 mg by mouth at bedtime.   Yes [provider]  traZODone (DESYREL) 50 MG tablet Take  50-100 mg by mouth at bedtime as needed for sleep.    Yes [provider]  albuterol (PROVENTIL HFA;VENTOLIN HFA) 108 (90 Base) MCG/ACT inhaler Inhale 3 puffs into the lungs every 4 (four) hours as needed for wheezing or shortness of breath.     [provider]  Menthol, Topical Analgesic, (ABSORBINE PAIN RELIEVING EX) Apply 1 patch topically daily as needed (for pain).    [provider]     Critical care time:     Velna Ochs, M.D. - PGY3 Pager: [phone] 01/25/2018, 6:37 AM

## 2018-01-25 NOTE — Progress Notes (Signed)
SLP Cancellation Note  Patient Details Name: HARTWELL VANDIVER MRN: [id number] DOB: [birth year]   Cancelled treatment:       Reason Eval/Treat Not Completed: Medical issues which prohibited therapy. Per chart review, pt is "Sedated, unresponsive, 4 point restraints." Will f/u for PO readiness.    Germain Osgood 01/25/2018, 9:59 AM  Germain Osgood, M.A. [place] Acute Environmental education officer [phone] Office [phone]

## 2018-01-25 NOTE — NC FL2 (Addendum)
[place] LEVEL OF CARE SCREENING TOOL     IDENTIFICATION  Patient Name: Benjamin [name] Birthdate: [birth year] Sex: male Admission Date (Current Location): 01/17/2018  [facility] and [place] Number:  Herbalist and Address:  [facility]. [facility], [place] [address], [place], [place] [address]      Provider Number: [id number]  Attending Physician Name and Address:  Juanito Doom, MD  Relative Name and Phone Number:       Current Level of Care: Hospital Recommended Level of Care: [place] Prior Approval Number:    Date Approved/Denied:   PASRR Number: pending  Discharge Plan: SNF    Current Diagnoses: Patient Active Problem List   Diagnosis Date Noted  . Acute sinusitis   . Acute respiratory failure with hypoxemia ([place])   . Acute respiratory insufficiency 01/17/2018  . Altered mental state 01/17/2018  . Seizure ([place])   . Acute metabolic encephalopathy   . Hypovolemic shock ([place])   . Septic shock ([place])   . AKI (acute kidney injury) ([place])   . History of adenomatous polyp of colon 06/01/2017  . Pain of upper abdomen 12/10/2014  . Rhabdomyolysis 01/18/2013  . Elevated creatine kinase 01/18/2013  . Duodenal ulcer 11/16/2012  . Abdominal pain, epigastric 09/27/2012  . Constipation 04/29/2012  . GERD (gastroesophageal reflux disease) 04/29/2012  . BACK PAIN, CHRONIC 06/27/2009    Orientation RESPIRATION BLADDER Height & Weight     Self, Place  O2(Nasal Cannula 4L) Continent Weight: 184 lb 15.5 oz (83.9 kg) Height:  5\' 8"  (172.7 cm)  BEHAVIORAL SYMPTOMS/MOOD NEUROLOGICAL BOWEL NUTRITION STATUS      Continent Cortrak Tube, tube feeds  AMBULATORY STATUS COMMUNICATION OF NEEDS Skin   Total Care Verbally Normal                       Personal Care Assistance Level of Assistance  Bathing, Feeding, Dressing Bathing Assistance: Maximum assistance Feeding assistance: Maximum assistance Dressing Assistance:  Maximum assistance     Functional Limitations Info  Sight, Hearing, Speech Sight Info: Adequate Hearing Info: Adequate Speech Info: Adequate    SPECIAL CARE FACTORS FREQUENCY  PT (By licensed PT), OT (By licensed OT)     PT Frequency: 2x OT Frequency: 2x            Contractures Contractures Info: Not present    Additional Factors Info  Code Status, Allergies Code Status Info: Full Code Allergies Info: no known allergies           Current Medications (01/25/2018):  This is the current hospital active medication list Current Facility-Administered Medications  Medication Dose Route Frequency Provider Last Rate Last Dose  . 0.9 %  sodium chloride infusion  250 mL Intravenous Continuous Dorie Rank, MD      . 0.9 %  sodium chloride infusion  250 mL Intravenous Continuous Silver Huguenin Z, MD 20 mL/hr at 01/25/18 1300 250 mL at 01/25/18 1300  . acetaminophen (TYLENOL) tablet 650 mg  650 mg Oral Q4H PRN Aljishi, Virgina Norfolk, MD      . dexmedetomidine (PRECEDEX) 400 MCG/100ML (4 mcg/mL) infusion  0.4-1.2 mcg/kg/hr Intravenous Titrated Velna Ochs, MD 8.76 mL/hr at 01/25/18 1300 0.4 mcg/kg/hr at 01/25/18 1300  . dextrose 5 %-0.45 % sodium chloride infusion   Intravenous Continuous Seawell, Jaimie A, DO 100 mL/hr at 01/25/18 1500    . haloperidol lactate (HALDOL) injection 2-4 mg  2-4 mg Intravenous Q4H PRN Velna Ochs,  MD      . heparin injection 5,000 Units  5,000 Units Subcutaneous Q8H Harvel Quale, [place]   5,000 Units at 01/25/18 1400  . hydrALAZINE (APRESOLINE) injection 10 mg  10 mg Intravenous Q4H PRN Anders Simmonds, MD      . lacosamide (VIMPAT) 100 mg in sodium chloride 0.9 % 25 mL IVPB  100 mg Intravenous Q12H Amie Portland, MD 70 mL/hr at 01/25/18 1004 100 mg at 01/25/18 1004  . levETIRAcetam (KEPPRA) IVPB 1000 mg/100 mL premix  1,000 mg Intravenous Q12H Simonne Maffucci B, MD 400 mL/hr at 01/25/18 1006 1,000 mg at 01/25/18 1006  . LORazepam (ATIVAN) injection 1  mg  1 mg Intravenous Q8H Velna Ochs, MD   1 mg at 01/25/18 0533  . LORazepam (ATIVAN) injection 1-2 mg  1-2 mg Intravenous Q4H PRN Simonne Maffucci B, MD   2 mg at 01/25/18 1052  . MEDLINE mouth rinse  15 mL Mouth Rinse BID Simonne Maffucci B, MD   15 mL at 01/25/18 1022  . morphine 2 MG/ML injection 0.5 mg  0.5 mg Intravenous Q12H Velna Ochs, MD   0.5 mg at 01/25/18 1033  . pantoprazole sodium (PROTONIX) 40 mg/20 mL oral suspension 40 mg  40 mg Per Tube Daily Juanito Doom, MD   Stopped at 01/23/18 1034  . senna-docusate (Senokot-S) tablet 1 tablet  1 tablet Per Tube BID Harvel Quale, [facility]   Stopped at 01/23/18 1035  . sodium chloride flush (NS) 0.9 % injection 10-40 mL  10-40 mL Intracatheter Q12H Juanito Doom, MD   10 mL at 01/24/18 2121  . sodium chloride flush (NS) 0.9 % injection 10-40 mL  10-40 mL Intracatheter PRN Juanito Doom, MD         Discharge Medications: Please see discharge summary for a list of discharge medications.  Relevant Imaging Results:  Relevant Lab Results:   Additional Information SSN: [id number]  Eileen Stanford, LCSW

## 2018-01-25 NOTE — Evaluation (Signed)
Physical Therapy Evaluation Patient Details Name: Benjamin [name] MRN: [id number] DOB: 02-Oct-1970 Today's Date: 01/25/2018   History of Present Illness  48 y/o male admitted with encephalopathy with Sz. Intubated 12/30-1/4. PMhx: polysubstance abuse, depression, GERD, solitary kidney, bad back with pain  Clinical Impression  PT sat pt EOB today without much luck in increasing his arousal.  He did follow a one step command twice for me, but otherwise was total assist and eyes closed throughout the session.  Pt may need post acute rehab after his acute hospital stay.   PT to follow acutely for deficits listed below.      Follow Up Recommendations SNF    Equipment Recommendations  Wheelchair (measurements PT);Wheelchair cushion (measurements PT);Hospital bed    Recommendations for Other Services       Precautions / Restrictions Precautions Precautions: Fall Restrictions Weight Bearing Restrictions: No      Mobility  Bed Mobility Overal bed mobility: Needs Assistance Bed Mobility: Supine to Sit;Sit to Supine Rolling: Total assist   Supine to sit: Total assist;+2 for physical assistance Sit to supine: Total assist;+2 for physical assistance   General bed mobility comments: Two person total assist for bed mobility.  I was able to sit him up by myself with use of HOB maximally elevated, but he did not help at all during transitions.          Balance Overall balance assessment: Needs assistance Sitting-balance support: Feet supported;No upper extremity supported Sitting balance-Leahy Scale: Zero Sitting balance - Comments: total assist EOB.  Postural control: Other (comment)(forward slummped posture, chin on chest)                                   Pertinent Vitals/Pain Pain Assessment: Faces Faces Pain Scale: No hurt Pain Location: back (chronic)    Home Living Family/patient expects to be discharged to:: Skilled nursing facility Living Arrangements:  Parent Available Help at Discharge: Family;Available 24 hours/day Type of Home: House Home Access: Stairs to enter Entrance Stairs-Rails: None Entrance Stairs-Number of Steps: 3 Home Layout: One level Home Equipment: Shower seat Additional Comments: hx information from chart review.     Prior Function Level of Independence: Independent         Comments: per mother pt does not do much other than his basic ADLs due to bad back (per OT note)     Hand Dominance   Dominant Hand: Right    Extremity/Trunk Assessment   Upper Extremity Assessment Upper Extremity Assessment: Generalized weakness    Lower Extremity Assessment Lower Extremity Assessment: Generalized weakness    Cervical / Trunk Assessment Cervical / Trunk Assessment: Other exceptions Cervical / Trunk Exceptions: h/o chronic low back pain, also appears to have forward head and large lower cervical hump in sitting EOB, likely emphasized by the fact that he is not holding his own head up at this time.   Communication   Communication: No difficulties(low tone, mumbles)  Cognition Arousal/Alertness: Lethargic;Suspect due to medications Behavior During Therapy: Flat affect Overall Cognitive Status: Impaired/Different from baseline Area of Impairment: Orientation;Attention;Memory;Following commands;Safety/judgement;Awareness;Problem solving                 Orientation Level: Disoriented to;Person;Place;Time;Situation Current Attention Level: Focused Memory: Decreased recall of precautions;Decreased short-term memory Following Commands: Follows one step commands with increased time;Follows one step commands inconsistently Safety/Judgement: Decreased awareness of safety;Decreased awareness of deficits Awareness: Intellectual Problem Solving: Slow processing;Decreased initiation;Difficulty sequencing;Requires  verbal cues;Requires tactile cues General Comments: Pt very lethargic for me today, open eyes only once  upright and only followed 2 commands to squeeze my hand on the right and on the left, but fleeting after that.  RN reports rounds of significant restlessness with somnolence.      General Comments General comments (skin integrity, edema, etc.): Pt aroused momentarily seated EOB and RN came in a turned down his precidex, but he remained very lethargic.      Exercises General Exercises - Upper Extremity Shoulder Flexion: PROM;Both;10 reps Elbow Flexion: PROM;Both;10 reps Elbow Extension: PROM;Both;10 reps Wrist Extension: PROM;Both;10 reps General Exercises - Lower Extremity Ankle Circles/Pumps: PROM;Both;10 reps Heel Slides: PROM;Both;10 reps   Assessment/Plan    PT Assessment Patient needs continued PT services  PT Problem List Decreased strength;Decreased balance;Decreased activity tolerance;Decreased mobility;Decreased cognition;Decreased knowledge of use of DME;Decreased safety awareness;Decreased knowledge of precautions;Pain       PT Treatment Interventions DME instruction;Gait training;Stair training;Functional mobility training;Therapeutic activities;Therapeutic exercise;Balance training;Patient/family education;Manual techniques    PT Goals (Current goals can be found in the Care Plan section)  Acute Rehab PT Goals Patient Stated Goal: unable to state today PT Goal Formulation: Patient unable to participate in goal setting Time For Goal Achievement: 02/08/18 Potential to Achieve Goals: Fair    Frequency Min 2X/week           AM-PAC PT "6 Clicks" Mobility  Outcome Measure Help needed turning from your back to your side while in a flat bed without using bedrails?: Total Help needed moving from lying on your back to sitting on the side of a flat bed without using bedrails?: Total Help needed moving to and from a bed to a chair (including a wheelchair)?: Total Help needed standing up from a chair using your arms (e.g., wheelchair or bedside chair)?: Total Help needed  to walk in hospital room?: Total Help needed climbing 3-5 steps with a railing? : Total 6 Click Score: 6    End of Session Equipment Utilized During Treatment: Oxygen Activity Tolerance: Patient limited by fatigue;Patient limited by lethargy Patient left: in bed;with call bell/phone within reach;with bed alarm set;with restraints reapplied(4 point restraints) Nurse Communication: (RN in room at end of session) PT Visit Diagnosis: Muscle weakness (generalized) (M62.81);Difficulty in walking, not elsewhere classified (R26.2);Pain Pain - Right/Left: (back) Pain - part of body: (back)    Time: 7026-3785 PT Time Calculation (min) (ACUTE ONLY): 19 min   Charges:       Wells Guiles B. Myah Guynes, PT, DPT  Acute Rehabilitation #[phone] pager #[phone] office   PT Evaluation $PT Eval Moderate Complexity: 1 Mod          01/25/2018, 1:50 PM

## 2018-01-25 NOTE — Progress Notes (Signed)
48 year old man with a history of polysubstance abuse admitted 12/30 with seizure and acute encephalopathy, intubated for airway protection Head CT was negative, EEG was worrisome for burst suppression and he was placed on IV propofol, CSF was negative.  MRI 1/2 did not show any evidence of brain injury  He was extubated 1/4 and has remained encephalopathic since then with.'s of lucidity. Standing Ativan was started on 1/5. On exam this morning-remains somnolent, on Precedex, received intermittent Ativan for breakthrough agitation, follows one-step commands, clear breath sounds bilateral, S1-S2 normal, soft and nontender abdomen.  Labs show normal electrolytes, normal CBGs Chest x-ray 1/5 personally reviewed which shows right lower lobe atelectasis/infiltrate.  Impression/plan  Acute encephalopathy-continue Precedex, continue standing dose of Ativan 1 mg every 8 and use as needed Ativan for breakthrough per CIWA protocol. Some concern for opiate use disorder-he may have been on buprenorphine patch, we will see if we can start this sublingual once he is able to take.  We will also plan to place core track and see if we can resume some of his home meds such as trazodone and Celexa  New onset seizures-continue Keppra and valproate per neurology but doubt that he will need this long-term  Right lower lobe MSSA pneumonia-treated with antibiotics, acute respiratory failure is resolved  My independent critical care time x 57m  Kara Mead MD. FCCP. [facility] Pager [phone] If no response call [phone]   01/25/2018

## 2018-01-26 ENCOUNTER — Inpatient Hospital Stay (HOSPITAL_COMMUNITY): Payer: Medicaid Other

## 2018-01-26 LAB — GLUCOSE, CAPILLARY
GLUCOSE-CAPILLARY: 107 mg/dL — AB (ref 70–99)
Glucose-Capillary: 102 mg/dL — ABNORMAL HIGH (ref 70–99)
Glucose-Capillary: 102 mg/dL — ABNORMAL HIGH (ref 70–99)
Glucose-Capillary: 111 mg/dL — ABNORMAL HIGH (ref 70–99)
Glucose-Capillary: 115 mg/dL — ABNORMAL HIGH (ref 70–99)
Glucose-Capillary: 118 mg/dL — ABNORMAL HIGH (ref 70–99)
Glucose-Capillary: 119 mg/dL — ABNORMAL HIGH (ref 70–99)

## 2018-01-26 LAB — BASIC METABOLIC PANEL
Anion gap: 7 (ref 5–15)
BUN: 5 mg/dL — ABNORMAL LOW (ref 6–20)
CALCIUM: 8.2 mg/dL — AB (ref 8.9–10.3)
CO2: 24 mmol/L (ref 22–32)
Chloride: 110 mmol/L (ref 98–111)
Creatinine, Ser: 0.83 mg/dL (ref 0.61–1.24)
GFR calc Af Amer: >60 mL/min (ref >60)
GFR calc non Af Amer: >60 mL/min (ref >60)
Glucose, Bld: 117 mg/dL — ABNORMAL HIGH (ref 70–99)
Potassium: 3 mmol/L — ABNORMAL LOW (ref 3.5–5.1)
Sodium: 141 mmol/L (ref 135–145)

## 2018-01-26 LAB — PHOSPHORUS: Phosphorus: 3 mg/dL (ref 2.5–4.6)

## 2018-01-26 LAB — MAGNESIUM: Magnesium: 1.9 mg/dL (ref 1.7–2.4)

## 2018-01-26 MED ORDER — DULOXETINE HCL 60 MG PO CPEP
60.0000 mg | ORAL_CAPSULE | Freq: Every day | ORAL | Status: DC
  Administered 2018-01-26 – 2018-01-28 (×3): 60 mg via ORAL
  Filled 2018-01-26 (×3): qty 1

## 2018-01-26 MED ORDER — BUSPIRONE HCL 5 MG PO TABS
10.0000 mg | ORAL_TABLET | Freq: Three times a day (TID) | ORAL | Status: DC
  Administered 2018-01-26 – 2018-01-27 (×4): 10 mg via ORAL
  Filled 2018-01-26 (×4): qty 2

## 2018-01-26 MED ORDER — ONDANSETRON HCL 4 MG/2ML IJ SOLN
4.0000 mg | Freq: Four times a day (QID) | INTRAMUSCULAR | Status: DC | PRN
  Administered 2018-01-26: 4 mg via INTRAVENOUS

## 2018-01-26 MED ORDER — LEVETIRACETAM 500 MG PO TABS
1000.0000 mg | ORAL_TABLET | Freq: Two times a day (BID) | ORAL | Status: DC
  Administered 2018-01-26 – 2018-01-28 (×4): 1000 mg via ORAL
  Filled 2018-01-26 (×4): qty 2

## 2018-01-26 MED ORDER — DULOXETINE HCL 30 MG PO CPEP: 30.0000 mg | ORAL_CAPSULE | Freq: Every day | ORAL | Status: DC

## 2018-01-26 MED ORDER — CLONIDINE HCL 0.2 MG PO TABS
0.2000 mg | ORAL_TABLET | Freq: Four times a day (QID) | ORAL | Status: DC
  Administered 2018-01-26 – 2018-01-27 (×3): 0.2 mg via ORAL
  Filled 2018-01-26 (×3): qty 1

## 2018-01-26 MED ORDER — POTASSIUM CHLORIDE 10 MEQ/100ML IV SOLN
10.0000 meq | INTRAVENOUS | Status: AC
  Administered 2018-01-26 (×5): 10 meq via INTRAVENOUS
  Filled 2018-01-26: qty 100

## 2018-01-26 MED ORDER — FOLIC ACID 1 MG PO TABS
1.0000 mg | ORAL_TABLET | Freq: Every day | ORAL | Status: DC
  Administered 2018-01-26 – 2018-02-02 (×8): 1 mg
  Filled 2018-01-26 (×8): qty 1

## 2018-01-26 MED ORDER — HALOPERIDOL 5 MG PO TABS
5.0000 mg | ORAL_TABLET | Freq: Every day | ORAL | Status: DC
  Administered 2018-01-26 – 2018-01-30 (×5): 5 mg via ORAL
  Filled 2018-01-26 (×5): qty 1

## 2018-01-26 MED ORDER — LACOSAMIDE 50 MG PO TABS
100.0000 mg | ORAL_TABLET | Freq: Two times a day (BID) | ORAL | Status: DC
  Administered 2018-01-26 – 2018-01-27 (×2): 100 mg via ORAL
  Filled 2018-01-26 (×2): qty 2

## 2018-01-26 MED ORDER — VITAL 1.5 CAL PO LIQD
1000.0000 mL | ORAL | Status: DC
  Administered 2018-01-26: 1000 mL
  Filled 2018-01-26 (×2): qty 1000

## 2018-01-26 MED ORDER — ONDANSETRON HCL 4 MG/2ML IJ SOLN
INTRAMUSCULAR | Status: AC
  Administered 2018-01-26: 4 mg via INTRAVENOUS
  Filled 2018-01-26: qty 2

## 2018-01-26 MED ORDER — VITAMIN B-1 100 MG PO TABS
100.0000 mg | ORAL_TABLET | Freq: Every day | ORAL | Status: DC
  Administered 2018-01-26 – 2018-02-02 (×8): 100 mg
  Filled 2018-01-26 (×8): qty 1

## 2018-01-26 MED ORDER — PRO-STAT SUGAR FREE PO LIQD
30.0000 mL | Freq: Every day | ORAL | Status: DC
  Administered 2018-01-26 – 2018-01-29 (×4): 30 mL
  Filled 2018-01-26 (×4): qty 30

## 2018-01-26 MED ORDER — HALOPERIDOL 5 MG PO TABS: 5.0000 mg | ORAL_TABLET | Freq: Every day | ORAL | Status: DC

## 2018-01-26 NOTE — Progress Notes (Signed)
48 year old man with a history of polysubstance abuse admitted 12/30 with seizure and acute encephalopathy, intubated for airway protection Head CT was negative, EEG showed burst suppression and he was placed on IV propofol, CSF was negative. MRI 1/2 did not show any evidence of brain injury  He was extubated 1/4 and has remained encephalopathic since  Apparently had conversation with nursing staff and resident yesterday Episode of severe agitation this morning, received Ativan and Haldol and is now somnolent again, not following commands, clear breath sounds bilateral, S1-S2 normal, soft and nontender abdomen.  Core track was inserted and x-ray abdomen personally reviewed which shows tube in position.  Labs show hypokalemia, otherwise normal electrolytes.  Impression/plan  Acute encephalopathy -continue Precedex, now that NG tube is been placed, resume trazodone, Celexa and standing dose of Haldol overnight. Can use Haldol and Ativan for breakthrough agitation.  New onset seizures-continue Keppra and valproic acid neurology, remains to be seen whether he will need this long-term.  MSSA pneumonia right lower lobe was treated with antibiotics, acute respiratory failure is resolved. Hypokalemia will be repleted.  My independent critical care time x 7m  Kara Mead MD. FCCP. [facility] Pager [phone] If no response call [phone]   01/26/2018

## 2018-01-26 NOTE — Progress Notes (Signed)
NAME:  Benjamin [name], MRN:  [id number], DOB:  12-23-1970, LOS: 9 ADMISSION DATE:  01/17/2018, CONSULTATION DATE:  01/17/2018 REFERRING MD:  EDP, CHIEF COMPLAINT:  Confusion   Brief History   48 y/o male with polysubstance abuse admitted with acute encephalopathy and seizure, intubated in ER for airway protection.  Past Medical History  Polysubstance abuse, anxiety, chronic back pain, diveritulitis, GERD, Solitary kidney, spinal stenosis  Significant Hospital Events   12/30 admission, intubation 12/31 worrisome findings for recent status epilepticus, continuous EEG monitoring arranged, started propofol 1/4 Mental status improved, extubated  Consults:  Neurology  Procedures:  12/30 > intubated 12/31 lumbar puncture> no pleocytosis  Significant Diagnostic Tests:   CT head 12/30>Multifocal paranasal sinus disease with patient intubated. Brain parenchyma appears unremarkable. No mass or hemorrhage.  EEG 12/31> worrisome for burst suppression, recent status epilepticus  MRI Brain 1/2> No acute abnormality; no evidence of hypoxic ischemic injury  Micro Data:  Blood12/30 >No growth Sputum 12/30> MSAA CSF 12/31 > No growth  Antimicrobials:  12/30 Ceftriaxone > 1/3 dc'd 1/3 Cefazolin > Completed 1/5   Interim history/subjective:  Sleepy this morning but awakes to voice / light touch. Alert to person and place. Denies alcohol use. Told me that he was "in a coma".  Objective   Blood pressure 109/73, pulse (!) 58, temperature 97.8 F (36.6 C), temperature source Axillary, resp. rate (!) 23, height 5\' 8"  (1.727 m), weight 85.5 kg, SpO2 97 %.        Intake/Output Summary (Last 24 hours) at 01/26/2018 6010 Last data filed at 01/26/2018 0524 Gross per 24 hour  Intake 1675.21 ml  Output 2085 ml  Net -409.79 ml   Filed Weights   01/24/18 0500 01/25/18 0500 01/26/18 0500  Weight: 84 kg 83.9 kg 85.5 kg    Examination:  General: Appears comfortable, non toxic  HENT:  NCAT, [place] PULM: Diffuse rhonchi, no wheezing  CV: RRR, no mgr GI: BS+, soft, nontender MSK: normal bulk and tone Neuro: Sleepy but awakens to voice, follows commands, alert to person & place  Resolved Hospital Problem list   AKI Ileus Sepsis due to RLL PNA Bradycardia  Assessment & Plan:   Acute encephalopathy: Improving. Initically concern for prolonged post ictal state. Now likely withdrawal syndrome plus medical delirium.  > CIWA > Continue ativan 1mg  q8h  > Morphine 0.5 mg q12h > PRN Haldol 2-4 mg q4h > wean precedex as able   Status Epilepticus: resolved  > continue keppra & valproate per neurology recs  Acute Hypoxic Respiratory Failure: Extubated 1/4  > Stable on Mena  Hypokalemia: replete prn  > monitor BMET and Mg  Polysubstance Abuse: > monitor for withdrawal > CIWA  Best practice:  Diet: tube feeding Pain/Anxiety/Delirium protocol (if indicated): Versed, propofol, RASS goal -0 VAP protocol (if indicated): n/a DVT prophylaxis: SCDs GI prophylaxis: Protonix 40 mg q24 hours Glucose control: Daily labs Mobility: Strict bed rest Code Status: FULL Family Communication: None at bedside Disposition: To stepdown / triad if able to wean off precedex  Labs   CBC: Recent Labs  Lab 01/19/18 0707 01/21/18 0947 01/23/18 0355  WBC 9.9 6.9 9.0  NEUTROABS  --   --  5.6  HGB 14.8 13.9 14.0  HCT 44.4 41.8 44.0  MCV 89.9 90.7 92.8  PLT 133* 150 932    Basic Metabolic Panel: Recent Labs  Lab 01/19/18 0707  01/20/18 3557  01/21/18 0947 01/21/18 1440 01/21/18 1628 01/22/18 0220 01/23/18 0355 01/24/18 0631 01/25/18 3220  NA 145   < > 146*   < > 146* 145  --  143 144 142 141  K 2.6*   < > 2.5*   < > 2.6* 3.5  --  2.9* 3.7 3.5 3.8  CL 109   < > 111   < > 109 112*  --  109 110 111 111  CO2 28   < > 26   < > 27 24  --  24 25 22 23   GLUCOSE 103*   < > 139*   < > 108* 125*  --  111* 77 86 124*  BUN 11   < > 5*   < > <5* <5*  --  5* 7 15 9   CREATININE 1.31*   <  > 1.07   < > 1.04 1.00  --  0.87 0.85 0.93 0.76  CALCIUM 8.8*   < > 8.0*   < > 7.9* 8.2*  --  8.4* 8.5* 8.6* 8.5*  MG 2.1  --  1.9  --  1.8 2.5* 2.4 2.3  --   --   --   PHOS 1.2*  --   --   --   --  2.7 2.3* 3.1  --   --   --    < > = values in this interval not displayed.   GFR: Estimated Creatinine Clearance: 121.4 mL/min (by C-G formula based on SCr of 0.76 mg/dL). Recent Labs  Lab 01/19/18 0707 01/21/18 0947 01/23/18 0355  WBC 9.9 6.9 9.0    Liver Function Tests: No results for input(s): AST, ALT, ALKPHOS, BILITOT, PROT, ALBUMIN in the last 168 hours. No results for input(s): LIPASE, AMYLASE in the last 168 hours. Recent Labs  Lab 01/19/18 1145 01/21/18 1440  AMMONIA 45* 57*    ABG    Component Value Date/Time   PHART 7.301 (L) 01/18/2018 0557   PCO2ART 37.4 01/18/2018 0557   PO2ART 98.0 01/18/2018 0557   HCO3 18.3 (L) 01/18/2018 0557   TCO2 19 (L) 01/18/2018 0557   ACIDBASEDEF 7.0 (H) 01/18/2018 0557   O2SAT 97.0 01/18/2018 0557     Coagulation Profile: No results for input(s): INR, PROTIME in the last 168 hours.  Cardiac Enzymes: No results for input(s): CKTOTAL, CKMB, CKMBINDEX, TROPONINI in the last 168 hours.  HbA1C: No results found for: HGBA1C  CBG: Recent Labs  Lab 01/25/18 1204 01/25/18 1656 01/25/18 2020 01/26/18 0030 01/26/18 0419  GLUCAP 96 95 114* 107* 102*    Past Medical History  He,  has a past medical history of Acute stomach ulcer, Anxiety, Arthritis, Broken back (1980's), Chronic back pain, Depression, Diverticulitis of duodenum, Duodenal diverticulum, GERD (gastroesophageal reflux disease), H/O clavicle fracture, Headache(784.0), IBS (irritable bowel syndrome), Solitary kidney, congenital, and Spinal stenosis.   Surgical History    Past Surgical History:  Procedure Laterality Date  . COLONOSCOPY N/A 05/16/2012   ZDG:UYQIH polyp-tubular adenoma. TI 10cm normal. Next TCS 04/2017.  Marland Kitchen COLONOSCOPY WITH PROPOFOL N/A 11/01/2017    Procedure: COLONOSCOPY WITH PROPOFOL;  Surgeon: Daneil Dolin, MD;  [facility];  Service: Endoscopy;  Laterality: N/A;  10:00am  . ESOPHAGOGASTRODUODENOSCOPY N/A 09/29/2012   KVQ:QVZDGL ulcerative/erosive reflux esophagitis. Hiatal hernia. Multiple duodenal bulbar ulcers. Negative H.pylori serology  . ESOPHAGOGASTRODUODENOSCOPY N/A 06/02/2013   Dr. Gala Romney: Mild erosive reflux esophagitis  . ESOPHAGOGASTRODUODENOSCOPY (EGD) WITH PROPOFOL N/A 11/01/2017   Procedure: ESOPHAGOGASTRODUODENOSCOPY (EGD) WITH PROPOFOL;  Surgeon: Daneil Dolin, MD;  Location: [facility];  Service: Endoscopy;  Laterality: N/A;  . POLYPECTOMY  11/01/2017   Procedure: POLYPECTOMY;  Surgeon: Daneil Dolin, MD;  Location: [facility];  Service: Endoscopy;;  colon     Social History   reports that he has been smoking cigarettes. He has a 30.00 pack-year smoking history. He has never used smokeless tobacco. He reports that he does not drink alcohol or use drugs.   Family History   His family history includes Diabetes in his mother; Heart failure in his mother; Hypertension in his mother. There is no history of Colon cancer.   Allergies No Known Allergies   Home Medications  Prior to Admission medications   Medication Sig Start Date End Date Taking? Authorizing Provider  buprenorphine (BUTRANS) 5 MCG/HR PTWK patch Place 5 mcg onto the skin once a week.   Yes [provider]  busPIRone (BUSPAR) 10 MG tablet Take 10 mg by mouth 3 (three) times daily.   Yes [provider]  CARAFATE 1 GM/10ML suspension TAKE 10 MLS BY MOUTH FOUR TIMES DAILY. Patient taking differently: Take 1 g by mouth 4 (four) times daily.  10/12/17  Yes Carlis Stable, NP  citalopram (CELEXA) 20 MG tablet Take 20 mg by mouth daily.   Yes [provider]  DEXILANT 60 MG capsule TAKE ONE CAPSULE BY MOUTH ONCE DAILY. Patient taking differently: Take 60 mg by mouth daily.  11/24/17  Yes Carlis Stable, NP    DULoxetine (CYMBALTA) 30 MG capsule Take 30 mg by mouth daily. Take 1 capsule twice daily   Yes [provider]  haloperidol (HALDOL) 5 MG tablet Take 5 mg by mouth at bedtime.   Yes [provider]  traZODone (DESYREL) 50 MG tablet Take 50-100 mg by mouth at bedtime as needed for sleep.    Yes [provider]  albuterol (PROVENTIL HFA;VENTOLIN HFA) 108 (90 Base) MCG/ACT inhaler Inhale 3 puffs into the lungs every 4 (four) hours as needed for wheezing or shortness of breath.     [provider]  Menthol, Topical Analgesic, (ABSORBINE PAIN RELIEVING EX) Apply 1 patch topically daily as needed (for pain).    [provider]     Critical care time:     Velna Ochs, M.D. - PGY3 Pager: [phone] 01/26/2018, 6:32 AM

## 2018-01-26 NOTE — Progress Notes (Signed)
Nutrition Follow-up  DOCUMENTATION CODES:   Not applicable  INTERVENTION:   Tube feeding:  Vital 1.5 at 30 ml/hr via cortrak Increase by 10 ml q8 hours to goal rate of 60 ml/hr (1440 ml)  Pro-Stat 30 mL daily  At goal TF provides 2160 kcals, 113 g of protein, 1094 mL of free water Meets 100% estimated calorie and protein needs  **Recommend supplementation of folic acid and thiamine for suspected ETOH abuse  NUTRITION DIAGNOSIS:   Inadequate oral intake related to acute illness as evidenced by NPO status.  Ongoing  GOAL:   Patient will meet greater than or equal to 90% of their needs  Not meeting- being addressed with TF  MONITOR:   Vent status, TF tolerance, Labs, Weight trends  REASON FOR ASSESSMENT:   Ventilator    ASSESSMENT:    48 yo male admitted with AMS and new onset seizures requiring intubation, concern for meningitis/encephaliti. +cocaine, THC, opiates. PMH includes spinal stenosis, diverticulitis, gastric ulcer, IBS    1/03 TF started 1/04 Extubated 1/8- Cortrak placed  Pt more alert but mentation remains a barrier to swallowing evaluation, likely withdrawal syndrome per CCM. A Cortrak was placed this am. Mouth/tongue shows to have white/yellowish build up. Pt has remained NPO for essentially 9 days (spent one day with TF). Will start trickle and titrate as pt is at risk for refeeding.   Weight noted to increase from 185 lb on 1/6 to 188 lb today.   I/O: +7.4 L since admit UOP: 2085 ml x 24 hrs  Medications reviewed and include: senokot, precedex, D5-1/2 NS @ 100 ml/hr Labs reviewed: K 3.0 (L)   Diet Order:   Diet Order            Diet NPO time specified  Diet effective now              EDUCATION NEEDS:   Not appropriate for education at this time  Skin:  Skin Assessment: Reviewed RN Assessment  Last BM:  1/6  Height:   Ht Readings from Last 1 Encounters:  01/17/18 5\' 8"  (1.727 m)    Weight:   Wt Readings from Last 1  Encounters:  01/26/18 85.5 kg    Ideal Body Weight:  70 kg  BMI:  Body mass index is 28.66 kg/m.  Estimated Nutritional Needs:   Kcal:  2100-2400 kcals   Protein:  105-120  Fluid:  >/= 2 L   Mariana Single RD, LDN Clinical Nutrition Pager # [phone]

## 2018-01-26 NOTE — Care Management Note (Signed)
Case Management Note Manya Silvas, RN MSN CCM Transitions of Care 61M [place] [phone]  Patient Details  Name: ARISTOTLE LIEB MRN: [id number] Date of Birth: Mar 03, 1970  Subjective/Objective:          Acute encephalopathy          Action/Plan: PTA home with parents. Chronic back pain. Independent with adls. Initially intubated in ICU. Extubated 01/22/18. Requiring sedation (precedex, haldol, ativan) d/t agitation. 1/8 cortrak placed with initiation of TF. OT recommending snf. CSW following for snf. Will continue to follow for transition of care needs.   Expected Discharge Date:                  Expected Discharge Plan:  Skilled Nursing Facility  In-House Referral:  Clinical Social Work  Discharge planning Services  CM Consult  Post Acute Care Choice:  NA Choice offered to:  NA  DME Arranged:  N/A DME Agency:  NA  HH Arranged:  NA HH Agency:  NA  Status of Service:  In process, will continue to follow  If discussed at Long Length of Stay Meetings, dates discussed:    Additional Comments:  Bartholomew Crews, RN 01/26/2018, 1:45 PM

## 2018-01-26 NOTE — Clinical Social Work Note (Signed)
Clinical Social Work Assessment  Patient Details  Name: Benjamin [name] MRN: [id number] Date of Birth: April 26, 1970  Date of referral:  01/26/18               Reason for consult:  Facility Placement                Permission sought to share information with:    Permission granted to share information::     Name::     Benjamin [name]  Agency::     Relationship::  Brother  Contact Information:     Housing/Transportation Living arrangements for the past 2 months:  Single Family Home Source of Information:  Siblings Patient Interpreter Needed:  None Criminal Activity/Legal Involvement Pertinent to Current Situation/Hospitalization:  No - Comment as needed Significant Relationships:  Parents, Siblings Lives with:  Parents Do you feel safe going back to the place where you live?  No Need for family participation in patient care:  No (Coment)  Care giving concerns:  Pt is alert to self and place. No family or friends present at bedside.   Social Worker assessment / plan:  CSW spoke with pt's brother via telephone. Pt's brother states pt lives with their elderly mother and father. Pt's brother understanding of SNF recommendation. CSW explained the barrier to a pt going to SNF with Cortrak tube--CSW will continue to follow for nutrition plan closer to d/c. Pt's brother prefers placement in [place]. CSW to f/o and follow up with pt's family once plan for nutrition is determined, when pt is closer to d/c.  Employment status:    Insurance information:  Medicaid [place] PT Recommendations:  [place] / Referral to community resources:  [place]  Patient/Family's Response to care:  Pt's brother verbalized understanding of CSW role and expressed appreciation for support. Pt's brother denies any concern regarding pt care at this time.   Patient/Family's Understanding of and Emotional Response to Diagnosis, Current Treatment, and Prognosis:  Pt's brother  understanding and realistic regarding pt's physical limitations. Pt's brother understands the need for pt to d/c to SNF. Pt's brother denies any concern regarding pt's treatment plan at this time. CSW will continue to provide support and facilitate d/c needs.   Emotional Assessment Appearance:  Appears stated age Attitude/Demeanor/Rapport:  Unable to Assess Affect (typically observed):  Unable to Assess Orientation:  Oriented to Self, Oriented to Place Alcohol / Substance use:  Not Applicable Psych involvement (Current and /or in the community):  No (Comment)  Discharge Needs  Concerns to be addressed:  Basic Needs, Care Coordination Readmission within the last 30 days:  No Current discharge risk:  Dependent with Mobility Barriers to Discharge:  Continued Medical Work up   [employer], LCSW 01/26/2018, 3:11 PM

## 2018-01-26 NOTE — Procedures (Signed)
Cortrak  Person Inserting Tube:  Esaw Dace, RD Tube Type:  Cortrak - 43 inches Tube Location:  Left nare Initial Placement:  Stomach Secured by: Bridle Technique Used to Measure Tube Placement:  Documented cm marking at nare/ corner of mouth Cortrak Secured At:  75 cm Procedure Comments:  Cortrak Tube Team Note:  Consult received to place a Cortrak feeding tube.   Pt agitated during placement. RN and additional RD at bedside to assist. X-ray is required, abdominal x-ray has been ordered by the Cortrak team. Please confirm tube placement before using the Cortrak tube.   If the tube becomes dislodged please keep the tube and contact the Cortrak team at [email or web address] ([email or web address]) for replacement.  If after hours and replacement cannot be delayed, place a NG tube and confirm placement with an abdominal x-ray.   Kerman Passey MS, RD, [place], [place] [phone] Pager  [phone] Weekend/On-Call Pager

## 2018-01-26 NOTE — Progress Notes (Signed)
SLP Cancellation Note  Patient Details Name: Benjamin [name] MRN: [id number] DOB: 22-Sep-1970   Cancelled treatment:       Reason Eval/Treat Not Completed: Patient not medically ready. Still requiring high levels of sedation, not alert presently, getting Cortrak. Page me if pts arousal is manageable for swallow assessment.   Herbie Baltimore, MA [place]  Acute Rehabilitation Services Pager [phone] Office [phone]  Lynann Beaver 01/26/2018, 9:57 AM

## 2018-01-27 LAB — BASIC METABOLIC PANEL
ANION GAP: 8 (ref 5–15)
BUN: <5 mg/dL — ABNORMAL LOW (ref 6–20)
CO2: 25 mmol/L (ref 22–32)
Calcium: 8.6 mg/dL — ABNORMAL LOW (ref 8.9–10.3)
Chloride: 106 mmol/L (ref 98–111)
Creatinine, Ser: 0.77 mg/dL (ref 0.61–1.24)
GFR calc Af Amer: >60 mL/min (ref >60)
GLUCOSE: 115 mg/dL — AB (ref 70–99)
Potassium: 3.5 mmol/L (ref 3.5–5.1)
Sodium: 139 mmol/L (ref 135–145)

## 2018-01-27 LAB — MAGNESIUM: Magnesium: 1.9 mg/dL (ref 1.7–2.4)

## 2018-01-27 LAB — GLUCOSE, CAPILLARY
Glucose-Capillary: 115 mg/dL — ABNORMAL HIGH (ref 70–99)
Glucose-Capillary: 126 mg/dL — ABNORMAL HIGH (ref 70–99)
Glucose-Capillary: 108 mg/dL — ABNORMAL HIGH (ref 70–99)
Glucose-Capillary: 105 mg/dL — ABNORMAL HIGH (ref 70–99)
Glucose-Capillary: 96 mg/dL (ref 70–99)

## 2018-01-27 LAB — PHOSPHORUS: Phosphorus: 2.9 mg/dL (ref 2.5–4.6)

## 2018-01-27 MED ORDER — MORPHINE SULFATE (PF) 2 MG/ML IV SOLN
0.5000 mg | Freq: Two times a day (BID) | INTRAVENOUS | Status: DC | PRN
  Administered 2018-01-28 (×2): 0.5 mg via INTRAVENOUS
  Filled 2018-01-27 (×3): qty 1

## 2018-01-27 MED ORDER — LORAZEPAM 2 MG/ML IJ SOLN
1.0000 mg | Freq: Three times a day (TID) | INTRAMUSCULAR | Status: DC | PRN
  Administered 2018-01-27 – 2018-01-30 (×6): 1 mg via INTRAVENOUS
  Filled 2018-01-27 (×7): qty 1

## 2018-01-27 MED ORDER — LACOSAMIDE 50 MG PO TABS
100.0000 mg | ORAL_TABLET | Freq: Two times a day (BID) | ORAL | Status: DC
  Administered 2018-01-27 – 2018-01-28 (×2): 100 mg
  Filled 2018-01-27 (×2): qty 2

## 2018-01-27 MED ORDER — CLONIDINE HCL 0.3 MG PO TABS
0.3000 mg | ORAL_TABLET | Freq: Four times a day (QID) | ORAL | Status: DC
  Administered 2018-01-27 – 2018-01-29 (×9): 0.3 mg via ORAL
  Filled 2018-01-27 (×9): qty 1

## 2018-01-27 MED ORDER — BUSPIRONE HCL 5 MG PO TABS
10.0000 mg | ORAL_TABLET | Freq: Three times a day (TID) | ORAL | Status: DC
  Administered 2018-01-27 – 2018-02-02 (×19): 10 mg
  Filled 2018-01-27 (×19): qty 2

## 2018-01-27 NOTE — Progress Notes (Signed)
48 year old man with a history of polysubstance abuse admitted 12/30 with seizure and acute encephalopathy, intubated for airway protection Head CT was negative, EEG showed burst suppression and he was placed on IV propofol, CSF was negative. MRI 1/2 did not show any evidence of brain injury  He was extubated 1/4and has remained encephalopathic since  NGT was inserted 1/8 and he had a better last 24 hours Remains on Precedex drip without breakthrough agitation, on standing low-dose morphine IV.  On exam-follows one-step commands but really not fully awake, clear breath sounds bilateral, S1-S2 normal, soft and nontender abdomen.  Labs show mild hypokalemia, otherwise normal electrolytes.  Kathyrn Lass  Acute encephalopathy -continue Precedex, have achieved smoother control with oral Haldol, and resuming trazodone and BuSpar and duloxetine Can use IV Haldol and Ativan for breakthrough agitation Tube feeds have been started  New onset seizures-continue Keppra and valproate per neurology Hypokalemia will be repleted  MSSA pneumonia-treated with antibiotics Acute respiratory failure-resolved  Independent critical care time x 47m  Benjamin [name] V. Elsworth Soho MD
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
## 2018-01-27 NOTE — Progress Notes (Signed)
NAME:  Benjamin [name], MRN:  [id number], DOB:  Dec 13, 1970, LOS: 17 ADMISSION DATE:  01/17/2018, CONSULTATION DATE:  01/17/2018 REFERRING MD:  EDP, CHIEF COMPLAINT:  Confusion   Brief History   48 y/o male with polysubstance abuse admitted with acute encephalopathy and seizure, intubated in ER for airway protection.  Past Medical History  Polysubstance abuse, anxiety, chronic back pain, diveritulitis, GERD, Solitary kidney, spinal stenosis  Significant Hospital Events   12/30 admission, intubation 12/31 worrisome findings for recent status epilepticus, continuous EEG monitoring arranged, started propofol 1/4 Mental status improved, extubated  Consults:  Neurology  Procedures:  12/30 > intubated 12/31 lumbar puncture> no pleocytosis  Significant Diagnostic Tests:   CT head 12/30>Multifocal paranasal sinus disease with patient intubated. Brain parenchyma appears unremarkable. No mass or hemorrhage.  EEG 12/31> worrisome for burst suppression, recent status epilepticus  MRI Brain 1/2> No acute abnormality; no evidence of hypoxic ischemic injury  Micro Data:  Blood12/30 >No growth Sputum 12/30> MSAA CSF 12/31 > No growth  Antimicrobials:  12/30 Ceftriaxone > 1/3 dc'd 1/3 Cefazolin > Completed 1/5   Interim history/subjective:  Continues to have waxing and waning mental status. Conversational yesterday with RN, reported his drug of choice was morphine and percocet. Sedated this morning, opens eyes to sternal rub, follows some commands.   Objective   Blood pressure (!) 149/97, pulse 61, temperature 97.6 F (36.4 C), temperature source Axillary, resp. rate (!) 21, height 5\' 8"  (1.727 m), weight 87.2 kg, SpO2 98 %.        Intake/Output Summary (Last 24 hours) at 01/27/2018 6659 Last data filed at 01/27/2018 0630 Gross per 24 hour  Intake 2330.4 ml  Output 3905 ml  Net -1574.6 ml   Filed Weights   01/25/18 0500 01/26/18 0500 01/27/18 0500  Weight: 83.9 kg 85.5 kg  87.2 kg    Examination:  General: Appears comfortable, non toxic  HENT: NCAT, [place] PULM: Diffuse rhonchi, no wheezing  CV: RRR, no mgr GI: BS+, soft, nontender MSK: normal bulk and tone Neuro: Sedated, pens eyes to touch, follows some commands  Resolved Hospital Problem list   AKI Ileus Sepsis due to RLL PNA Bradycardia  Assessment & Plan:   Acute encephalopathy: Waxing and waning mental status. Initially concerned for prolonged post ictal state. Now likely withdrawal syndrome plus medical delirium.  > CIWA > Continue ativan 1mg  q8h  > Morphine 0.5 mg q12h > Restarted home haldol, buspar, and duloxetine 1/8 per tube > PRN Haldol 2-4 mg q4h > Precedex / clonidine taper per pharmacy   Status Epilepticus: resolved  > continue keppra & valproate per neurology recs  Acute Hypoxic Respiratory Failure: Extubated 1/4  > Stable on [place]  Hypokalemia: replete prn  > monitor BMET and Mg  Polysubstance Abuse: > monitor for withdrawal > CIWA  Best practice:  Diet: tube feeding Pain/Anxiety/Delirium protocol (if indicated): Versed, propofol, RASS goal -0 VAP protocol (if indicated): n/a DVT prophylaxis: SCDs GI prophylaxis: Protonix 40 mg q24 hours Glucose control: Daily labs Mobility: Up with assistance  Code Status: FULL Family Communication: None at bedside Disposition: To stepdown / triad if able to wean off precedex  Labs   CBC: Recent Labs  Lab 01/21/18 0947 01/23/18 0355  WBC 6.9 9.0  NEUTROABS  --  5.6  HGB 13.9 14.0  HCT 41.8 44.0  MCV 90.7 92.8  PLT 150 935    Basic Metabolic Panel: Recent Labs  Lab 01/21/18 0947 01/21/18 1440 01/21/18 1628 01/22/18 0220 01/23/18 0355  01/24/18 0631 01/25/18 0909 01/26/18 0600  NA 146* 145  --  143 144 142 141 141  K 2.6* 3.5  --  2.9* 3.7 3.5 3.8 3.0*  CL 109 112*  --  109 110 111 111 110  CO2 27 24  --  24 25 22 23 24   GLUCOSE 108* 125*  --  111* 77 86 124* 117*  BUN <5* <5*  --  5* 7 15 9  5*  CREATININE  1.04 1.00  --  0.87 0.85 0.93 0.76 0.83  CALCIUM 7.9* 8.2*  --  8.4* 8.5* 8.6* 8.5* 8.2*  MG 1.8 2.5* 2.4 2.3  --   --   --  1.9  PHOS  --  2.7 2.3* 3.1  --   --   --  3.0   GFR: Estimated Creatinine Clearance: 118.1 mL/min (by C-G formula based on SCr of 0.83 mg/dL). Recent Labs  Lab 01/21/18 0947 01/23/18 0355  WBC 6.9 9.0    Liver Function Tests: No results for input(s): AST, ALT, ALKPHOS, BILITOT, PROT, ALBUMIN in the last 168 hours. No results for input(s): LIPASE, AMYLASE in the last 168 hours. Recent Labs  Lab 01/21/18 1440  AMMONIA 57*    ABG    Component Value Date/Time   PHART 7.301 (L) 01/18/2018 0557   PCO2ART 37.4 01/18/2018 0557   PO2ART 98.0 01/18/2018 0557   HCO3 18.3 (L) 01/18/2018 0557   TCO2 19 (L) 01/18/2018 0557   ACIDBASEDEF 7.0 (H) 01/18/2018 0557   O2SAT 97.0 01/18/2018 0557     Coagulation Profile: No results for input(s): INR, PROTIME in the last 168 hours.  Cardiac Enzymes: No results for input(s): CKTOTAL, CKMB, CKMBINDEX, TROPONINI in the last 168 hours.  HbA1C: No results found for: HGBA1C  CBG: Recent Labs  Lab 01/26/18 1120 01/26/18 1522 01/26/18 1923 01/26/18 2345 01/27/18 0354  GLUCAP 115* 119* 102* 118* 115*    Past Medical History  He,  has a past medical history of Acute stomach ulcer, Anxiety, Arthritis, Broken back (1980's), Chronic back pain, Depression, Diverticulitis of duodenum, Duodenal diverticulum, GERD (gastroesophageal reflux disease), H/O clavicle fracture, Headache(784.0), IBS (irritable bowel syndrome), Solitary kidney, congenital, and Spinal stenosis.   Surgical History    Past Surgical History:  Procedure Laterality Date  . COLONOSCOPY N/A 05/16/2012   IZT:IWPYK polyp-tubular adenoma. TI 10cm normal. Next TCS 04/2017.  Marland Kitchen COLONOSCOPY WITH PROPOFOL N/A 11/01/2017   Procedure: COLONOSCOPY WITH PROPOFOL;  Surgeon: Daneil Dolin, MD;  [facility];  Service: Endoscopy;  Laterality: N/A;   10:00am  . ESOPHAGOGASTRODUODENOSCOPY N/A 09/29/2012   DXI:PJASNK ulcerative/erosive reflux esophagitis. Hiatal hernia. Multiple duodenal bulbar ulcers. Negative H.pylori serology  . ESOPHAGOGASTRODUODENOSCOPY N/A 06/02/2013   Dr. Gala Romney: Mild erosive reflux esophagitis  . ESOPHAGOGASTRODUODENOSCOPY (EGD) WITH PROPOFOL N/A 11/01/2017   Procedure: ESOPHAGOGASTRODUODENOSCOPY (EGD) WITH PROPOFOL;  Surgeon: Daneil Dolin, MD;  [facility];  Service: Endoscopy;  Laterality: N/A;  . POLYPECTOMY  11/01/2017   Procedure: POLYPECTOMY;  Surgeon: Daneil Dolin, MD;  [facility];  Service: Endoscopy;;  colon     Social History   reports that he has been smoking cigarettes. He has a 30.00 pack-year smoking history. He has never used smokeless tobacco. He reports that he does not drink alcohol or use drugs.   Family History   His family history includes Diabetes in his mother; Heart failure in his mother; Hypertension in his mother. There is no history of Colon cancer.  Allergies No Known Allergies   Home Medications  Prior to Admission medications   Medication Sig Start Date End Date Taking? Authorizing Provider  buprenorphine (BUTRANS) 5 MCG/HR PTWK patch Place 5 mcg onto the skin once a week.   Yes [provider]  busPIRone (BUSPAR) 10 MG tablet Take 10 mg by mouth 3 (three) times daily.   Yes [provider]  CARAFATE 1 GM/10ML suspension TAKE 10 MLS BY MOUTH FOUR TIMES DAILY. Patient taking differently: Take 1 g by mouth 4 (four) times daily.  10/12/17  Yes Carlis Stable, NP  citalopram (CELEXA) 20 MG tablet Take 20 mg by mouth daily.   Yes [provider]  DEXILANT 60 MG capsule TAKE ONE CAPSULE BY MOUTH ONCE DAILY. Patient taking differently: Take 60 mg by mouth daily.  11/24/17  Yes Carlis Stable, NP  DULoxetine (CYMBALTA) 30 MG capsule Take 30 mg by mouth daily. Take 1 capsule twice daily   Yes [provider]  haloperidol (HALDOL)  5 MG tablet Take 5 mg by mouth at bedtime.   Yes [provider]  traZODone (DESYREL) 50 MG tablet Take 50-100 mg by mouth at bedtime as needed for sleep.    Yes [provider]  albuterol (PROVENTIL HFA;VENTOLIN HFA) 108 (90 Base) MCG/ACT inhaler Inhale 3 puffs into the lungs every 4 (four) hours as needed for wheezing or shortness of breath.     [provider]  Menthol, Topical Analgesic, (ABSORBINE PAIN RELIEVING EX) Apply 1 patch topically daily as needed (for pain).    [provider]     Critical care time:     Velna Ochs, M.D. - PGY3 Pager: [phone] 01/27/2018, 6:39 AM

## 2018-01-28 DIAGNOSIS — T428X2A Poisoning by antiparkinsonism drugs and other central muscle-tone depressants, intentional self-harm, initial encounter: Principal | ICD-10-CM

## 2018-01-28 DIAGNOSIS — T1491XA Suicide attempt, initial encounter: Secondary | ICD-10-CM

## 2018-01-28 LAB — BASIC METABOLIC PANEL
Anion gap: 11 (ref 5–15)
BUN: 6 mg/dL (ref 6–20)
CALCIUM: 8.6 mg/dL — AB (ref 8.9–10.3)
CO2: 26 mmol/L (ref 22–32)
Chloride: 101 mmol/L (ref 98–111)
Creatinine, Ser: 0.87 mg/dL (ref 0.61–1.24)
GFR calc Af Amer: >60 mL/min (ref >60)
GFR calc non Af Amer: >60 mL/min (ref >60)
Glucose, Bld: 106 mg/dL — ABNORMAL HIGH (ref 70–99)
Potassium: 3.2 mmol/L — ABNORMAL LOW (ref 3.5–5.1)
Sodium: 138 mmol/L (ref 135–145)

## 2018-01-28 LAB — MAGNESIUM: Magnesium: 2 mg/dL (ref 1.7–2.4)

## 2018-01-28 LAB — GLUCOSE, CAPILLARY
Glucose-Capillary: 102 mg/dL — ABNORMAL HIGH (ref 70–99)
Glucose-Capillary: 104 mg/dL — ABNORMAL HIGH (ref 70–99)
Glucose-Capillary: 105 mg/dL — ABNORMAL HIGH (ref 70–99)
Glucose-Capillary: 108 mg/dL — ABNORMAL HIGH (ref 70–99)
Glucose-Capillary: 109 mg/dL — ABNORMAL HIGH (ref 70–99)
Glucose-Capillary: 96 mg/dL (ref 70–99)
Glucose-Capillary: 96 mg/dL (ref 70–99)

## 2018-01-28 LAB — PHOSPHORUS: Phosphorus: 3.4 mg/dL (ref 2.5–4.6)

## 2018-01-28 MED ORDER — VITAL 1.5 CAL PO LIQD
1000.0000 mL | ORAL | Status: DC
  Administered 2018-01-28 – 2018-01-29 (×2): 1000 mL
  Filled 2018-01-28 (×3): qty 1000

## 2018-01-28 MED ORDER — OXYCODONE-ACETAMINOPHEN 5-325 MG PO TABS
1.0000 | ORAL_TABLET | ORAL | Status: DC | PRN
  Administered 2018-01-28: 1 via ORAL
  Administered 2018-01-28 – 2018-01-30 (×5): 2 via ORAL
  Filled 2018-01-28: qty 2
  Filled 2018-01-28: qty 1
  Filled 2018-01-28 (×4): qty 2

## 2018-01-28 MED ORDER — LEVETIRACETAM 500 MG PO TABS
500.0000 mg | ORAL_TABLET | Freq: Two times a day (BID) | ORAL | Status: AC
  Administered 2018-01-28 – 2018-01-31 (×7): 500 mg via ORAL
  Filled 2018-01-28 (×7): qty 1

## 2018-01-28 MED ORDER — POTASSIUM CHLORIDE 20 MEQ/15ML (10%) PO SOLN
40.0000 meq | Freq: Once | ORAL | Status: AC
  Administered 2018-01-28: 40 meq via ORAL
  Filled 2018-01-28: qty 30

## 2018-01-28 MED ORDER — CITALOPRAM HYDROBROMIDE 20 MG PO TABS
20.0000 mg | ORAL_TABLET | Freq: Every day | ORAL | Status: DC
  Administered 2018-01-29 – 2018-02-02 (×5): 20 mg via ORAL
  Filled 2018-01-28 (×5): qty 1

## 2018-01-28 MED ORDER — POTASSIUM CHLORIDE CRYS ER 20 MEQ PO TBCR: 40.0000 meq | EXTENDED_RELEASE_TABLET | Freq: Once | ORAL | Status: DC

## 2018-01-28 NOTE — Progress Notes (Signed)
Occupational Therapy Treatment Patient Details Name: Benjamin [name] MRN: [id number] DOB: [birth year] Today's Date: 01/28/2018    History of present illness 48 y/o male admitted with encephalopathy with Sz. Intubated 12/30-1/4. PMhx: polysubstance abuse, depression, GERD, solitary kidney, bad back with pain   OT comments  Pt seen in conjunction with PT.  He was able to move to EOB with min A, and maintained EOB sitting with very close min guard assist.  He stood x 3 with min A +2 and sidestepped up edge of bed.  He requires max A for grooming activities, and max A - total A for remainder of ADLs.  He is oriented x 3 (not to situation), and demonstrates focused to brief periods of sustained attention.  He is demonstrating gradual improvements with cognition, activity tolerance, functional mobility, and ADLs.  Continue to recommend SNF.   Follow Up Recommendations  SNF    Equipment Recommendations  None recommended by OT    Recommendations for Other Services      Precautions / Restrictions Precautions Precautions: Fall Restrictions Weight Bearing Restrictions: No       Mobility Bed Mobility Overal bed mobility: Needs Assistance Bed Mobility: Supine to Sit;Sit to Supine     Supine to sit: Min assist;+2 for safety/equipment Sit to supine: Min assist;+2 for safety/equipment   General bed mobility comments: assist to initiate movement, to move Rt LE off bed and to lift trunk.  mod verbal cues for problem solving and sequencing   Transfers Overall transfer level: Needs assistance Equipment used: 2 person hand held assist Transfers: Sit to/from Stand Sit to Stand: Min assist;+2 safety/equipment;+2 physical assistance         General transfer comment: stood x3 from EOB.  He requires assist to move into to standing and assist to steady     Balance Overall balance assessment: Needs assistance Sitting-balance support: Feet supported;No upper extremity supported Sitting  balance-Leahy Scale: Fair Sitting balance - Comments: able to sit EOB statically with close min guard assist    Standing balance support: Bilateral upper extremity supported Standing balance-Leahy Scale: Poor Standing balance comment: requires min A +2 and UE support                            ADL either performed or assessed with clinical judgement   ADL Overall ADL's : Needs assistance/impaired Eating/Feeding: NPO   Grooming: Wash/dry hands;Wash/dry face;Maximal assistance;Sitting   Upper Body Bathing: Maximal assistance;Sitting   Lower Body Bathing: Total assistance;Sit to/from stand   Upper Body Dressing : Total assistance;Sitting   Lower Body Dressing: Total assistance;Sit to/from stand   Toilet Transfer: Minimal assistance;+2 for physical assistance;+2 for safety/equipment;BSC   Toileting- Clothing Manipulation and Hygiene: Total assistance;Sit to/from stand       Functional mobility during ADLs: Minimal assistance;+2 for physical assistance;+2 for safety/equipment General ADL Comments: requires assist due to weakness and cognitive impairments      Vision   Additional Comments: Pt frequently asking for his glasses.  He reports difficulty seeing without them    Perception     Praxis      Cognition Arousal/Alertness: Awake/alert Behavior During Therapy: Flat affect Overall Cognitive Status: Impaired/Different from baseline Area of Impairment: Orientation;Attention;Memory;Following commands;Safety/judgement;Awareness;Problem solving                 Orientation Level: Disoriented to;Situation Current Attention Level: Sustained;Focused Memory: Decreased short-term memory Following Commands: Follows one step commands consistently(with cues ) Safety/Judgement: Decreased  awareness of safety;Decreased awareness of deficits Awareness: Intellectual Problem Solving: Slow processing;Decreased initiation;Difficulty sequencing;Requires verbal  cues;Requires tactile cues General Comments: Pt follows one step commands with min - mod cues/prompting and increased time.  He frequently self distracts.  He is fixated on needing his glasses and repeatedly asks for them despite being previously told that his glasses are not here.          Exercises Exercises: General Upper Extremity;General Lower Extremity General Exercises - Upper Extremity Shoulder Flexion: AAROM;Right;Left;5 reps;Seated   Shoulder Instructions       General Comments 02 sats remained >92% on RA     Pertinent Vitals/ Pain       Pain Assessment: 0-10 Pain Score: 9  Pain Location: back (chronic) Pain Descriptors / Indicators: Grimacing Pain Intervention(s): Monitored during session  Home Living                                          Prior Functioning/Environment              Frequency  Min 2X/week        Progress Toward Goals  OT Goals(current goals can now be found in the care plan section)  Progress towards OT goals: Progressing toward goals     Plan Discharge plan remains appropriate    Co-evaluation    PT/OT/SLP Co-Evaluation/Treatment: Yes Reason for Co-Treatment: Necessary to address cognition/behavior during functional activity;For patient/therapist safety;To address functional/ADL transfers   OT goals addressed during session: ADL's and self-care      AM-PAC OT "6 Clicks" Daily Activity     Outcome Measure   Help from another person eating meals?: Total Help from another person taking care of personal grooming?: A Lot Help from another person toileting, which includes using toliet, bedpan, or urinal?: A Lot Help from another person bathing (including washing, rinsing, drying)?: A Lot Help from another person to put on and taking off regular upper body clothing?: Total Help from another person to put on and taking off regular lower body clothing?: Total 6 Click Score: 9    End of Session    OT Visit  Diagnosis: Unsteadiness on feet (R26.81);Cognitive communication deficit (R41.841) Pain - part of body: (back )   Activity Tolerance Patient tolerated treatment well   Patient Left in bed;with call bell/phone within reach;with bed alarm set;with restraints reapplied   Nurse Communication Mobility status        Time: 1020-1044 OT Time Calculation (min): 24 min  Charges: OT General Charges $OT Visit: 1 Visit OT Treatments $Neuromuscular Re-education: 8-22 mins  Lucille Passy, OTR/L [place] Pager [phone] Office [phone]    Lucille Passy M 01/28/2018, 11:00 AM

## 2018-01-28 NOTE — Progress Notes (Signed)
Nutrition Follow-up  DOCUMENTATION CODES:   Not applicable  INTERVENTION:   Tube Feeding:  Increase Vital 1.5 to goal rate of 60 ml/hr Continue Pro-Stat 30 mL daily Provides 2160 kcals, 113 g of protein, 1094 mL of free water Meets 100% estimated calorie and protein needs  NUTRITION DIAGNOSIS:   Inadequate oral intake related to acute illness as evidenced by NPO status.  Being addressed via TF   GOAL:   Patient will meet greater than or equal to 90% of their needs  Progressing  MONITOR:   Vent status, TF tolerance, Labs, Weight trends  REASON FOR ASSESSMENT:   Ventilator    ASSESSMENT:    48 yo male admitted with AMS and new onset seizures requiring intubation, concern for meningitis/encephaliti. +cocaine, THC, opiates. PMH includes spinal stenosis, diverticulitis, gastric ulcer, IBS   Alert, confused. Mental status waxes and wanes. Weaning precedex as able.   Vital 1.5 @ 30 ml/hr, Pro-Stat daily via Cortrak SLP follow, pt not ready for diet advancement at this time  Current wt 88.2 kg; admission wt 84.5 kg.Net + 5L since admission  Labs: potassium 3.2 (L) Meds: folic acid, thiamine   Diet Order:   Diet Order            Diet NPO time specified  Diet effective now              EDUCATION NEEDS:   Not appropriate for education at this time  Skin:  Skin Assessment: Reviewed RN Assessment  Last BM:  1/6  Height:   Ht Readings from Last 1 Encounters:  01/17/18 5\' 8"  (1.727 m)    Weight:   Wt Readings from Last 1 Encounters:  01/28/18 88.2 kg    Ideal Body Weight:  70 kg  BMI:  Body mass index is 29.57 kg/m.  Estimated Nutritional Needs:   Kcal:  2100-2400 kcals   Protein:  105-120  Fluid:  >/= 2 L   Kerman Passey MS, RD, LDN, CNSC [phone] Pager  [phone] Weekend/On-Call Pager

## 2018-01-28 NOTE — Progress Notes (Signed)
CHART REVIEW NOTE Received call from IM resident about additional history. He is awake today. He provided additional history and acknowledged overdose of Baclofen in an attempt to commit suicide prior to presentation. Question for me was whether to continue AEDs. Given that this was provoked by Baclofen OD and EEG pattern now in hindsight could also reflect a toxicity pattern and not just a burnt out status or a burnt out status from Baclofen OD - it is OK to scale back and eventually d/c AEDs.  Can always restart AEDs if he seizes again - since he is going to be in hospital for few days and will be closely monotored.  RECS: -D/C Vimpat -Decrease Keppra to 500 BID for 2 days and then d/c Keppra. -Monitor clinically and maintain seizure precautions including [place] law driving restrictions.   -- Amie Portland, MD [facility] Pager: [phone] If 7pm to 7am, please call on call as listed on AMION.
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
## 2018-01-28 NOTE — Progress Notes (Addendum)
  Speech Language Pathology Treatment: Dysphagia  Patient Details Name: Benjamin [name] MRN: [id number] DOB: 08-02-1970 Today's Date: 01/28/2018 Time: 0321-2248 SLP Time Calculation (min) (ACUTE ONLY): 10 min  Assessment / Plan / Recommendation Clinical Impression  Pt more responsive to Po today, still moderately delirious, requiring restraints, but aware of PO consistently. Despite this, all PO, ice to puree is followed by coughing suggesting sensed aspiration events. Cough is not sufficiently forceful to eject any material. Pt may need objective testing prior to diet initiation, but mental status is not appropriate for testing at this time. Hopeful that with more time following extubation and improved mentation further spontaneous improvement will occur. Will follow closely for PO readiness.   HPI HPI: 48 y/o male with polysubstance abuse admitted with acute encephalopathy and seizure, intubated in ER for airway protection. Intubated 01/17/18 to 01/22/17.      SLP Plan  Continue with current plan of care       Recommendations  Diet recommendations: NPO Medication Administration: Via alternative means                Plan: Continue with current plan of care       GO               Benjamin Baltimore, MA [place] Pager [phone] Office [phone]  Lynann Beaver 01/28/2018, 2:27 PM

## 2018-01-28 NOTE — Progress Notes (Signed)
48 year old man with a history of polysubstance abuse admitted 12/30 with seizure and acute encephalopathy, intubated for airway protection Head CT was negative, EEGshowedburst suppression and he was placed on IV propofol, CSF was negative. MRI 1/2 did not show any evidence of brain injury  He was extubated 1/4and has remained encephalopathic since NG tube inserted 1/8 and meds introduced, he has been more lucid and was actually able to provide more history this morning.  He states that he took many tablets of baclofen and intended to hurt himself, complains of constant back pain, has been using Percocet from the street  On exam-calm, in four-point restraints, on Precedex, good cough, nonfocal, breath sounds bilateral decreased, S1-S2 normal, soft nontender abdomen, no edema.  Labs show hypokalemia, otherwise normal electrolytes.  Impression/plan  Acute encephalopathy-appears to be resolving Home meds have been resumed, use Celexa instead of duloxetine which he does not take. Given new history of suicidal overdose of baclofen-we will need suicide watch and psych input.  New onset seizures-based on above history, will discuss with neurology whether he really needs Keppra and valproate long-term.  MSSA pneumonia has been treated with antibiotics. Hypokalemia will be repleted Acute respiratory failure resolved  He needs aggressive PT as his encephalopathy continues to resolve  My independent critical care time x 34m  Rakesh V. Elsworth Soho MD

## 2018-01-28 NOTE — Progress Notes (Signed)
Physical Therapy Treatment Patient Details Name: Benjamin [name] MRN: [id number] DOB: May 21, 1970 Today's Date: 01/28/2018    History of Present Illness 48 y/o male admitted with encephalopathy with Sz. Intubated 12/30-1/4. PMhx: polysubstance abuse, depression, GERD, solitary kidney, bad back with pain    PT Comments    Patient progressing slowly towards PT goals. Tolerated standing bouts x3 from EOB with Min A of 2 for balance as well as side step along side bed with support. Pt fixated on finding glasses despite being told they are not in the room. Follows simple commands consistently with cues. Sp02 remained >92% on RA during session. RN requested pt not sit in the chair for safety concerns. Will follow.   Follow Up Recommendations  SNF     Equipment Recommendations  Wheelchair (measurements PT);Wheelchair cushion (measurements PT);Hospital bed    Recommendations for Other Services       Precautions / Restrictions Precautions Precautions: Fall Precaution Comments: restraints, NG Restrictions Weight Bearing Restrictions: No    Mobility  Bed Mobility Overal bed mobility: Needs Assistance Bed Mobility: Supine to Sit;Sit to Supine     Supine to sit: Min assist;+2 for safety/equipment Sit to supine: Min assist;+2 for safety/equipment   General bed mobility comments: assist to initiate movement, to move Rt LE off bed and to lift trunk.  mod verbal cues for problem solving and sequencing   Transfers Overall transfer level: Needs assistance Equipment used: 2 person hand held assist Transfers: Sit to/from Stand Sit to Stand: Min assist;+2 safety/equipment;+2 physical assistance         General transfer comment: stood x3 from EOB.  He requires assist to move into standing and assist to steady.  Ambulation/Gait Ambulation/Gait assistance: Min assist;+2 physical assistance Gait Distance (Feet): 3 Feet(x2 bouts) Assistive device: 1 person hand held assist Gait  Pattern/deviations: Step-to pattern     General Gait Details: Able to side step along bed with Min A for balance, posterior lean. Performed x2. Sp02 remained in mid 90s on RA.   Stairs             Wheelchair Mobility    Modified Rankin (Stroke Patients Only)       Balance Overall balance assessment: Needs assistance Sitting-balance support: Feet supported;No upper extremity supported Sitting balance-Leahy Scale: Fair Sitting balance - Comments: able to sit EOB statically with close min guard assist    Standing balance support: Bilateral upper extremity supported Standing balance-Leahy Scale: Poor Standing balance comment: requires min A +2 and UE support                             Cognition Arousal/Alertness: Awake/alert Behavior During Therapy: Flat affect Overall Cognitive Status: Impaired/Different from baseline Area of Impairment: Orientation;Attention;Memory;Following commands;Safety/judgement;Awareness;Problem solving                 Orientation Level: Disoriented to;Situation Current Attention Level: Sustained;Focused Memory: Decreased short-term memory Following Commands: Follows one step commands consistently(with cues) Safety/Judgement: Decreased awareness of safety;Decreased awareness of deficits Awareness: Intellectual Problem Solving: Slow processing;Decreased initiation;Difficulty sequencing;Requires verbal cues;Requires tactile cues General Comments: Pt follows one step commands with min - mod cues/prompting and increased time.  He frequently self distracts.  He is fixated on needing his glasses and repeatedly asks for them despite being previously told that his glasses are not here.        Exercises General Exercises - Upper Extremity Shoulder Flexion: AAROM;Right;Left;5 reps;Seated    General Comments  General comments (skin integrity, edema, etc.): 02 sats remained >92% on RA       Pertinent Vitals/Pain Pain Assessment:  0-10 Pain Score: 9  Pain Location: back (chronic) Pain Descriptors / Indicators: Grimacing Pain Intervention(s): Monitored during session    Home Living                      Prior Function            PT Goals (current goals can now be found in the care plan section) Progress towards PT goals: Progressing toward goals    Frequency    Min 2X/week      PT Plan Current plan remains appropriate    Co-evaluation PT/OT/SLP Co-Evaluation/Treatment: Yes Reason for Co-Treatment: Necessary to address cognition/behavior during functional activity;For patient/therapist safety;To address functional/ADL transfers PT goals addressed during session: Mobility/safety with mobility OT goals addressed during session: ADL's and self-care      AM-PAC PT "6 Clicks" Mobility   Outcome Measure  Help needed turning from your back to your side while in a flat bed without using bedrails?: A Little Help needed moving from lying on your back to sitting on the side of a flat bed without using bedrails?: A Lot Help needed moving to and from a bed to a chair (including a wheelchair)?: A Lot Help needed standing up from a chair using your arms (e.g., wheelchair or bedside chair)?: A Lot Help needed to walk in hospital room?: A Lot Help needed climbing 3-5 steps with a railing? : A Lot 6 Click Score: 13    End of Session Equipment Utilized During Treatment: Oxygen Activity Tolerance: Patient tolerated treatment well;Patient limited by fatigue Patient left: in bed;with call bell/phone within reach;with bed alarm set;with restraints reapplied(3 point restraints) Nurse Communication: Mobility status PT Visit Diagnosis: Muscle weakness (generalized) (M62.81);Difficulty in walking, not elsewhere classified (R26.2);Pain Pain - part of body: (back)     Time: 1020-1043 PT Time Calculation (min) (ACUTE ONLY): 23 min  Charges:  $Gait Training: 8-22 mins                     Wray Kearns,  PT, DPT Acute Rehabilitation Services Pager [phone] Office [phone]       [place] 01/28/2018, 12:24 PM

## 2018-01-29 LAB — BASIC METABOLIC PANEL
Anion gap: 10 (ref 5–15)
BUN: 11 mg/dL (ref 6–20)
CO2: 23 mmol/L (ref 22–32)
Calcium: 7.8 mg/dL — ABNORMAL LOW (ref 8.9–10.3)
Chloride: 109 mmol/L (ref 98–111)
Creatinine, Ser: 0.91 mg/dL (ref 0.61–1.24)
GFR calc Af Amer: >60 mL/min (ref >60)
Glucose, Bld: 90 mg/dL (ref 70–99)
Potassium: 3.4 mmol/L — ABNORMAL LOW (ref 3.5–5.1)
Sodium: 142 mmol/L (ref 135–145)

## 2018-01-29 LAB — GLUCOSE, CAPILLARY
GLUCOSE-CAPILLARY: 90 mg/dL (ref 70–99)
Glucose-Capillary: 102 mg/dL — ABNORMAL HIGH (ref 70–99)
Glucose-Capillary: 102 mg/dL — ABNORMAL HIGH (ref 70–99)
Glucose-Capillary: 122 mg/dL — ABNORMAL HIGH (ref 70–99)
Glucose-Capillary: 127 mg/dL — ABNORMAL HIGH (ref 70–99)

## 2018-01-29 LAB — PHOSPHORUS: Phosphorus: 3.6 mg/dL (ref 2.5–4.6)

## 2018-01-29 LAB — MAGNESIUM: Magnesium: 1.7 mg/dL (ref 1.7–2.4)

## 2018-01-29 MED ORDER — CLONIDINE HCL 0.2 MG PO TABS
0.2000 mg | ORAL_TABLET | Freq: Four times a day (QID) | ORAL | Status: AC
  Administered 2018-01-29 (×2): 0.2 mg via ORAL
  Filled 2018-01-29 (×2): qty 1

## 2018-01-29 MED ORDER — CLONIDINE HCL 0.1 MG PO TABS
0.1000 mg | ORAL_TABLET | Freq: Four times a day (QID) | ORAL | Status: DC
  Filled 2018-01-29 (×2): qty 1

## 2018-01-29 MED ORDER — CLONIDINE HCL 0.1 MG PO TABS
0.1000 mg | ORAL_TABLET | Freq: Two times a day (BID) | ORAL | Status: AC
  Administered 2018-01-30 (×2): 0.1 mg via ORAL

## 2018-01-29 MED ORDER — VITAL 1.5 CAL PO LIQD
1000.0000 mL | ORAL | Status: DC
  Administered 2018-01-31: 1000 mL
  Filled 2018-01-29 (×5): qty 1000

## 2018-01-29 MED ORDER — POTASSIUM CHLORIDE 20 MEQ/15ML (10%) PO SOLN
40.0000 meq | Freq: Once | ORAL | Status: AC
  Administered 2018-01-29: 40 meq
  Filled 2018-01-29: qty 30

## 2018-01-29 MED ORDER — CLONIDINE HCL 0.1 MG PO TABS: 0.1000 mg | ORAL_TABLET | Freq: Every day | ORAL | Status: DC

## 2018-01-29 NOTE — Progress Notes (Signed)
  Speech Language Pathology Treatment: Dysphagia  Patient Details Name: Benjamin [name] MRN: [id number] DOB: [birth year] Today's Date: 01/29/2018 Time: 9024-0973 SLP Time Calculation (min) (ACUTE ONLY): 16 min  Assessment / Plan / Recommendation Clinical Impression  ST follow up for PO readiness.  Per nursing the patient more alert today.  He was sitting up in the bedside chair totally alert when therapist entered room.  Cranial nerve exam was completed and it was unremarkable.  Lingual, labial, facial and jaw range of motion and strength were adequate.  Volitional cough was weak.  Oral care was provided.  He was presented with thin liquids via spoon, cup and straw, pureed material and dry solids.  The patient was more aware of boluses today and was able to self feed.  He appeared to be much improved with good mastication of dry solids.  Swallow trigger appeared to be timely and hyo-laryngeal movement was appreciated to palpation.  Consistent throat clear was seen given thin liquids.   Given length of intubation recommend he remain NPO except ice chips and occasional sips of water with staff only following good oral care.  ST will follow up for readiness for initiation of a diet.   HPI HPI: 48 y/o male with polysubstance abuse admitted with acute encephalopathy and seizure, intubated in ER for airway protection. Intubated 01/17/18 to 01/22/17.      SLP Plan  Continue with current plan of care;Other (Comment)(add ice chips and occasional sips of water with nursing/staff only)       Recommendations  Diet recommendations: NPO(x ice chips with staff) Medication Administration: Via alternative means Postural Changes and/or Swallow Maneuvers: Seated upright 90 degrees                Oral Care Recommendations: Oral care QID Follow up Recommendations: 24 hour supervision/assistance SLP Visit Diagnosis: Dysphagia, oropharyngeal phase (R13.12) Plan: Continue with current plan of care;Other  (Comment)(add ice chips and occasional sips of water with nursing/staff only)       [place], MA, CCC-SLP Acute Rehab SLP [phone]  Lamar Sprinkles 01/29/2018, 10:28 AM

## 2018-01-29 NOTE — Progress Notes (Signed)
48 year old man with a history of polysubstance abuse admitted 12/30 with seizure and acute encephalopathy, intubated for airway protection Head CT was negative, EEGshowedburst suppression on IV propofol, CSF was negative. MRI 1/2 did not show any evidence of brain injury  He was extubated 1/4and remained encephalopathic until 1/8 after resumption of oral meds via NG tube he has been more lucid since then and on 1/10 gives a history of baclofen intentional overdose, evaluated by psych.  He continues to improve, out of bed to chair today, Precedex was turned off this morning Calm, no restraints, able to engage in conversation, S1-S2 normal, clear breath sounds bilateral, soft nontender abdomen, no edema.  Labs show mild hypokalemia.  Impression/plan  Acute encephalopathy-resolving, if continues to remain off Precedex, then can transfer to floor  Suicidal ingestion of baclofen-continue suicide watch, will need behavioral health eventually as inpatient.  New onset seizures-given a bowel, does not appear to be primary seizure disorder and Keppra can be tapered per neurology.  MSSA pneumonia/acute respiratory failure-resolved Hypokalemia will be repleted.  Needs aggressive PT  Leanna Sato. Elsworth Soho MD [phone]

## 2018-01-29 NOTE — Progress Notes (Addendum)
Pt transferred from [facility].  Pt is alert and oriented x 3, disoriented to time. Pt has been pulling at cortrak and iv.  Pt is ambulatory with assistance.  Cortrak is in place, tubefeeding restarted. Sitter Mohammed is at bedside.  Pt has no personal belongings.  Sister came to visit and asked where pt's eyeglasses are.  No eyeglasses with pt, I called [facility] to verify that he has not had eyeglasses day today.

## 2018-01-30 LAB — GLUCOSE, CAPILLARY
Glucose-Capillary: 105 mg/dL — ABNORMAL HIGH (ref 70–99)
Glucose-Capillary: 125 mg/dL — ABNORMAL HIGH (ref 70–99)
Glucose-Capillary: 121 mg/dL — ABNORMAL HIGH (ref 70–99)
Glucose-Capillary: 122 mg/dL — ABNORMAL HIGH (ref 70–99)
Glucose-Capillary: 127 mg/dL — ABNORMAL HIGH (ref 70–99)

## 2018-01-30 MED ORDER — CLONIDINE HCL 0.1 MG PO TABS
0.1000 mg | ORAL_TABLET | Freq: Two times a day (BID) | ORAL | Status: DC
  Administered 2018-01-30 – 2018-01-31 (×2): 0.1 mg via ORAL
  Filled 2018-01-30 (×2): qty 1

## 2018-01-30 MED ORDER — CHLORDIAZEPOXIDE HCL 5 MG PO CAPS
5.0000 mg | ORAL_CAPSULE | Freq: Two times a day (BID) | ORAL | Status: DC
  Administered 2018-01-30 – 2018-01-31 (×3): 5 mg via ORAL
  Filled 2018-01-30 (×3): qty 1

## 2018-01-30 NOTE — Progress Notes (Signed)
PROGRESS NOTE                                                                                                                                                                                                             Patient Demographics:    Benjamin [name], is a 48 y.o. male, DOB - 12-08-1970, XIP:[id number]  Admit date - 01/17/2018   Admitting Physician Kipp Brood, MD  Outpatient Primary MD for the patient is Antony Contras, MD  LOS - 71  Chief Complaint  Patient presents with  . Loss of Consciousness       Brief Narrative  From H&P  48 year old male with history of chronic pain and polysubstance abuse coming in with altered mental status from outside hospital.  As per brother patient was in his usual state of health until yesterday afternoon at 2:00 when he went out came back at 7 PM and then around 66 PM patient started becoming very agitated and combative and then went to sleep.  His mother tried to wake him up this morning but he was very altered non-arousable so was brought into [facility] where he was obtunded hypotensive and had a seizure patient was intubated and due to requirement of vasopressors and possible sepsis patient was transferred to [facility] for further management.  He required intubation for a few days along with initial IV pressors and IV fluids.  He was stabilized in the ICU and was transferred to my service on 01/30/2018   Subjective:    Benjamin [name] today has, No headache, No chest pain, No abdominal pain - No Nausea, No new weakness tingling or numbness, No Cough - SOB.     Assessment  & Plan :    1.  Acute toxic encephalopathy caused by intentional baclofen overdose.  Initially requiring intubation for several days for airway protection, also caused hypotension requiring fluids and pressors.  Now stable.  Mentation close to baseline.  CT head, MRI brain nonacute.  Did have evidence of seizure activity on EEG.  Was kept on Vimpat and  Keppra along with CIWA protocol.  Much improved, neuro note noted.  Keppra to be stopped on 02/01/2018.  Vimpat has been discontinued.  Advance activity, PT and speech to evaluate.  Continue suicide precautions with sitter.  Sepsis and infection were ruled out.  2.  Attempted suicide.  Seen by psych inpatient psych facility treatment recommended.  3.  Dysphagia.  Currently NG tube feeds.  Due to #1 above, now  has a good cough reflex, mentation has improved and following commands, able to stand up and sit down with minimal effort.  Due for MBS on 01/31/2018.  4.  Underlying depression.  Continue psych meds.    Family Communication  : None present  Code Status : Full code  Disposition Plan  : MedSurg  Consults  :  PCCM, Psych, neurology  Procedures  :    12/30 > intubated, extubated 01/22/2018 12/31 lumbar puncture> no pleocytosis    CT head 12/30>Multifocal paranasal sinus disease with patient intubated. Brain parenchyma appears unremarkable. No mass or hemorrhage. EEG 12/31> worrisome for burst suppression, recent status epilepticus MRI Brain 1/2> No acute abnormality; no evidence of hypoxic ischemic injury   DVT Prophylaxis  :    Heparin    Lab Results  Component Value Date   PLT 158 01/23/2018    Diet :  Diet Order            Diet NPO time specified  Diet effective now               Inpatient Medications Scheduled Meds: . busPIRone  10 mg Per Tube TID  . chlordiazePOXIDE  5 mg Oral BID  . citalopram  20 mg Oral Daily  . cloNIDine  0.1 mg Oral BID  . folic acid  1 mg Per Tube Daily  . haloperidol  5 mg Oral QHS  . heparin injection (subcutaneous)  5,000 Units Subcutaneous Q8H  . levETIRAcetam  500 mg Oral BID  . mouth rinse  15 mL Mouth Rinse BID  . senna-docusate  1 tablet Per Tube BID  . sodium chloride flush  10-40 mL Intracatheter Q12H  . thiamine  100 mg Per Tube Daily   Continuous Infusions: . feeding supplement (VITAL 1.5 CAL) 60 mL/hr at 01/29/18  1808   PRN Meds:.acetaminophen, haloperidol lactate, hydrALAZINE, LORazepam, ondansetron (ZOFRAN) IV  Antibiotics  :   Anti-infectives (From admission, onward)   Start     Dose/Rate Route Frequency Ordered Stop   01/21/18 1100  ceFAZolin (ANCEF) IVPB 2g/100 mL premix  Status:  Discontinued     2 g 200 mL/hr over 30 Minutes Intravenous Every 8 hours 01/21/18 0957 01/24/18 1117   01/21/18 1000  cefTRIAXone (ROCEPHIN) 1 g in sodium chloride 0.9 % 100 mL IVPB  Status:  Discontinued     1 g 200 mL/hr over 30 Minutes Intravenous Every 24 hours 01/20/18 1138 01/21/18 0957   01/18/18 1400  valACYclovir (VALTREX) tablet 2,000 mg  Status:  Discontinued     2,000 mg Per Tube 3 times daily 01/18/18 0913 01/18/18 1333   01/18/18 0100  vancomycin (VANCOCIN) IVPB 750 mg/150 ml premix  Status:  Discontinued     750 mg 150 mL/hr over 60 Minutes Intravenous Every 12 hours 01/17/18 2018 01/18/18 1333   01/17/18 2200  cefTRIAXone (ROCEPHIN) 2 g in sodium chloride 0.9 % 100 mL IVPB  Status:  Discontinued     2 g 200 mL/hr over 30 Minutes Intravenous Every 12 hours 01/17/18 2014 01/20/18 1138   01/17/18 2200  acyclovir (ZOVIRAX) 930 mg in dextrose 5 % 150 mL IVPB  Status:  Discontinued     10 mg/kg  93 kg 168.6 mL/hr over 60 Minutes Intravenous Every 8 hours 01/17/18 2031 01/18/18 0913   01/17/18 2015  cefTRIAXone (ROCEPHIN) 2 g in sodium chloride 0.9 % 100 mL IVPB  Status:  Discontinued     2 g 200 mL/hr over 30 Minutes Intravenous  Every 24 hours 01/17/18 2003 01/17/18 2014   01/17/18 1200  vancomycin (VANCOCIN) 2,000 mg in sodium chloride 0.9 % 500 mL IVPB     2,000 mg 250 mL/hr over 120 Minutes Intravenous  Once 01/17/18 1145 01/17/18 1452   01/17/18 1145  ceFEPIme (MAXIPIME) 2 g in sodium chloride 0.9 % 100 mL IVPB     2 g 200 mL/hr over 30 Minutes Intravenous  Once 01/17/18 1133 01/17/18 1253   01/17/18 1145  metroNIDAZOLE (FLAGYL) IVPB 500 mg  Status:  Discontinued     500 mg 100 mL/hr over 60  Minutes Intravenous Every 8 hours 01/17/18 1133 01/18/18 0819   01/17/18 1145  vancomycin (VANCOCIN) IVPB 1000 mg/200 mL premix  Status:  Discontinued     1,000 mg 200 mL/hr over 60 Minutes Intravenous  Once 01/17/18 1133 01/17/18 1145          Objective:   Vitals:   01/29/18 1542 01/29/18 2034 01/30/18 0433 01/30/18 1000  BP: 139/89 118/73 131/76 131/76  Pulse: 88 (!) 102 (!) 101 (!) 101  Resp: (!) 22  18   Temp: 98.6 F (37 C) 98.4 F (36.9 C) 99.2 F (37.3 C)   TempSrc: Oral Oral Oral   SpO2: 98% 98% 96%   Weight:      Height:        Wt Readings from Last 3 Encounters:  01/29/18 86.4 kg  10/25/17 93 kg  09/28/17 92.1 kg     Intake/Output Summary (Last 24 hours) at 01/30/2018 1257 Last data filed at 01/30/2018 1159 Gross per 24 hour  Intake 248 ml  Output 1025 ml  Net -777 ml     Physical Exam  Awake Alert, Oriented X 3, No new F.N deficits, Normal affect .AT,PERRAL, NG in place Supple Neck,No JVD, No cervical lymphadenopathy appriciated.  Symmetrical Chest wall movement, Good air movement bilaterally, CTAB RRR,No Gallops,Rubs or new Murmurs, No Parasternal Heave +ve B.Sounds, Abd Soft, No tenderness, No organomegaly appriciated, No rebound - guarding or rigidity. No Cyanosis, Clubbing or edema, No new Rash or bruise       Data Review:    CBC No results for input(s): WBC, HGB, HCT, PLT, MCV, MCH, MCHC, RDW, LYMPHSABS, MONOABS, EOSABS, BASOSABS, BANDABS in the last 168 hours.  Invalid input(s): NEUTRABS, BANDSABD  Chemistries  Recent Labs  Lab 01/25/18 0909 01/26/18 0600 01/27/18 0600 01/28/18 0536 01/29/18 0303  NA 141 141 139 138 142  K 3.8 3.0* 3.5 3.2* 3.4*  CL 111 110 106 101 109  CO2 23 24 25 26 23   GLUCOSE 124* 117* 115* 106* 90  BUN 9 5* <5* 6 11  CREATININE 0.76 0.83 0.77 0.87 0.91  CALCIUM 8.5* 8.2* 8.6* 8.6* 7.8*  MG  --  1.9 1.9 2.0 1.7    ------------------------------------------------------------------------------------------------------------------ No results for input(s): CHOL, HDL, LDLCALC, TRIG, CHOLHDL, LDLDIRECT in the last 72 hours.  No results found for: HGBA1C ------------------------------------------------------------------------------------------------------------------ No results for input(s): TSH, T4TOTAL, T3FREE, THYROIDAB in the last 72 hours.  Invalid input(s): FREET3 ------------------------------------------------------------------------------------------------------------------ No results for input(s): VITAMINB12, FOLATE, FERRITIN, TIBC, IRON, RETICCTPCT in the last 72 hours.  Coagulation profile No results for input(s): INR, PROTIME in the last 168 hours.  No results for input(s): DDIMER in the last 72 hours.  Cardiac Enzymes No results for input(s): CKMB, TROPONINI, MYOGLOBIN in the last 168 hours.  Invalid input(s): CK ------------------------------------------------------------------------------------------------------------------ No results found for: BNP  Micro Results No results found for this or any previous visit (from the  past 240 hour(s)).  Radiology Reports Ct Head Wo Contrast  Result Date: 01/17/2018 CLINICAL DATA:  Patient found unresponsive EXAM: CT HEAD WITHOUT CONTRAST TECHNIQUE: Contiguous axial images were obtained from the base of the skull through the vertex without intravenous contrast. COMPARISON:  November 17, 2016 FINDINGS: Brain: The ventricles are normal in size and configuration. There is no intracranial mass, hemorrhage, extra-axial fluid collection, or midline shift. Brain parenchyma appears unremarkable. No acute infarct is demonstrable. Vascular: There is no appreciable hyperdense vessel. There is no appreciable vascular calcification. Skull: The bony calvarium appears intact. Sinuses/Orbits: There is mucosal thickening in the sphenoid sinuses bilaterally. There is  opacification in multiple ethmoid air cells. There is edema in each naris region. Patient is intubated. Other: Mastoid air cells are clear. IMPRESSION: Multifocal paranasal sinus disease with patient intubated. Brain parenchyma appears unremarkable. No mass or hemorrhage. Electronically Signed   By: Lowella Grip III M.D.   On: 01/17/2018 14:33   Mr Brain Wo Contrast  Result Date: 01/20/2018 CLINICAL DATA:  48 y/o M; evaluate for anoxic brain damage. History of chronic pain, polysubstance abuse, altered mental status. Positive drug screening for opiates, cocaine, THC. EXAM: MRI HEAD WITHOUT CONTRAST TECHNIQUE: Multiplanar, multiecho pulse sequences of the brain and surrounding structures were obtained without intravenous contrast. COMPARISON:  01/17/2018 CT head. FINDINGS: Brain: No acute infarction, hemorrhage, hydrocephalus, extra-axial collection or mass lesion. No structural or signal abnormality of the brain identified. Mild diffuse brain parenchymal volume loss for age. Vascular: Normal flow voids. Skull and upper cervical spine: Normal marrow signal. Sinuses/Orbits: Sphenoid and left maxillary sinus mucosal thickening. Partial opacification of mastoid air cells. Sinus and mastoid disease is likely due to intubation. Debris within the nasopharynx. Orbits are unremarkable. Other: None. IMPRESSION: 1. No acute intracranial abnormality identified. No findings of hypoxic ischemic injury at this time. 2. Mild diffuse volume loss of the brain. Electronically Signed   By: Kristine Garbe M.D.   On: 01/20/2018 18:30   Dg Chest Port 1 View  Result Date: 01/23/2018 CLINICAL DATA:  Endotracheal tube removal EXAM: PORTABLE CHEST 1 VIEW COMPARISON:  01/20/2018 FINDINGS: Endotracheal tube and nasogastric tube have been removed. Left lung remains clear. Moderate atelectasis/infiltrate at the right lung base appears similar. No worsening or new finding. IMPRESSION: Endotracheal tube and nasogastric tube  removed. Persistent right base atelectasis/infiltrate. Electronically Signed   By: Nelson Chimes M.D.   On: 01/23/2018 06:38   Dg Chest Port 1 View  Result Date: 01/20/2018 CLINICAL DATA:  48 year old male with respiratory failure. Right lung base opacity. Found down, obtunded. Seizures, possible anoxic injury. EXAM: PORTABLE CHEST 1 VIEW COMPARISON:  01/19/2018 and earlier. FINDINGS: Portable AP semi upright view at 0438 hours. Stable endotracheal tube tip at the level the clavicles. Enteric tube courses to the abdomen, tip not included. Underlying elevated right hemidiaphragm but there is patchy airspace opacity superimposed at the right lung base, infrahilar. No pneumothorax or pulmonary edema. The left lung remains clear allowing for portable technique. No definite pleural effusion. Mediastinal contours remain normal. Stable visualized osseous structures. IMPRESSION: 1. Continued airspace opacity at the right lung base superimposed on elevated hemidiaphragm. Differential considerations include infection, aspiration, and atelectasis. 2. No other No acute cardiopulmonary abnormality. 3.  Stable lines and tubes. Electronically Signed   By: Genevie Ann M.D.   On: 01/20/2018 07:02   Dg Chest Port 1 View  Result Date: 01/19/2018 CLINICAL DATA:  Respiratory failure. EXAM: PORTABLE CHEST 1 VIEW COMPARISON:  01/18/2018 FINDINGS: Endotracheal  tube terminates at the inferior margin of the clavicular heads, unchanged. Enteric tube terminates over the proximal stomach. The cardiac silhouette is normal in size. There is elevation of the right hemidiaphragm with increasing opacity in the right lung base. No large pleural effusion or pneumothorax is identified. An old left clavicle fracture is noted. IMPRESSION: Worsening right basilar airspace disease. Electronically Signed   By: Logan Bores M.D.   On: 01/19/2018 08:08   Dg Chest Port 1 View  Result Date: 01/18/2018 CLINICAL DATA:  Intubation. EXAM: PORTABLE CHEST 1  VIEW COMPARISON:  01/17/2018. FINDINGS: Endotracheal tube in stable position. NG tube noted with tip below left hemidiaphragm. Heart size stable. Bibasilar atelectasis/infiltrates. No pleural effusion or pneumothorax. IMPRESSION: 1. Endotracheal tube in stable position. NG tube noted with tip below left hemidiaphragm. 2.  Bibasilar atelectasis/infiltrates.  New from prior exam. Electronically Signed   By: Marlow   On: 01/18/2018 05:25   Dg Chest Port 1 View  Result Date: 01/17/2018 CLINICAL DATA:  Altered mental status. Status post intubation. EXAM: PORTABLE CHEST 1 VIEW COMPARISON:  Chest x-ray dated 02/07/2013 FINDINGS: Endotracheal tube tip is at the level of the thoracic inlet. Heart size and vascularity are normal. Lungs are clear. No acute bone abnormality. Old healed fracture of the left clavicle. IMPRESSION: Endotracheal tube in good position. Clear lungs. Electronically Signed   By: Lorriane Shire M.D.   On: 01/17/2018 11:59   Dg Abd Portable 1v  Result Date: 01/26/2018 CLINICAL DATA:  Feeding tube placement EXAM: PORTABLE ABDOMEN - 1 VIEW COMPARISON:  None. FINDINGS: There is a feeding tube with the tip projecting over the antrum of the stomach. There is no bowel dilatation to suggest obstruction. There is no evidence of pneumoperitoneum, portal venous gas or pneumatosis. There are no pathologic calcifications along the expected course of the ureters. The osseous structures are unremarkable. IMPRESSION: There is a feeding tube with the tip projecting over the antrum of the stomach. Electronically Signed   By: Kathreen Devoid   On: 01/26/2018 11:12   Dg Abd Portable 1v  Result Date: 01/18/2018 CLINICAL DATA:  High NG output EXAM: PORTABLE ABDOMEN - 1 VIEW COMPARISON:  CT 08/26/2017 FINDINGS: Esophageal tube tip overlies the gastric fundus. Airspace disease at the right lung base. Nonobstructed bowel gas pattern with overall decreased bowel gas. Scattered colon gas is noted. IMPRESSION:  1. Esophageal tube tip overlies the gastric fundus. 2. Nonobstructed gas pattern 3. Airspace disease at the right lung base. Electronically Signed   By: Donavan Foil M.D.   On: 01/18/2018 19:56   Dg Abd Portable 2v  Result Date: 01/21/2018 CLINICAL DATA:  Cough. EXAM: PORTABLE ABDOMEN - 2 VIEW COMPARISON:  01/18/2018. FINDINGS: NG tube noted coiled stomach. Soft tissue structures are unremarkable. No bowel distention. No free air. Lumbar spine scoliosis concave right. IMPRESSION: 1.  NG tube noted coiled stomach. 2.  No acute intra-abdominal abnormality identified. Electronically Signed   By: Marcello Moores  Register   On: 01/21/2018 15:54   Dg Fluoro Guide Lumbar Puncture  Result Date: 01/18/2018 CLINICAL DATA:  48 year old male found of tongue did. Seizure. Possible sepsis, meningitis. Intubated. Unsuccessful bedside lumbar puncture. EXAM: DIAGNOSTIC LUMBAR PUNCTURE UNDER FLUOROSCOPIC GUIDANCE FLUOROSCOPY TIME:  Fluoroscopy Time:  0 minutes 6 seconds Radiation Exposure Index (if provided by the fluoroscopic device): Number of Acquired Spot Images: 0 PROCEDURE: Informed consent was obtained from the patient's mother via telephone prior to the procedure, including potential complications of headache, allergy, and pain. A "  time-out" was performed. With the patient intubated and prone, the lower back was prepped with Betadine. 1% Lidocaine was used for local anesthesia. Lumbar puncture was performed at the L2-L3 level using left sub laminar technique and a 3.5 in x 20 gauge needle which had to be hubbed. Return of clear, colorless CSF with an elevated opening pressure of 35-36 cm water. 18 mL of CSF were obtained for laboratory studies. The patient tolerated the procedure well and there were no apparent complications. The patient was returned to the ICU in stable condition for continued treatment. IMPRESSION: Lumbar puncture with fluoroscopic guidance at L2-L3. Elevated opening pressure (although measured while prone  and intubated) estimated at 35-36 cm of water. 18 mL of clear, colorless CSF obtained for laboratory studies. Electronically Signed   By: Genevie Ann M.D.   On: 01/18/2018 11:29    Time Spent in minutes  30   Lala Lund M.D on 01/30/2018 at 12:57 PM  To page go to [email or web address] - password [email or web address]

## 2018-01-30 NOTE — Plan of Care (Signed)
  Problem: Education: Goal: Knowledge of General Education information will improve Description: Including pain rating scale, medication(s)/side effects and non-pharmacologic comfort measures Outcome: Progressing   Problem: Clinical Measurements: Goal: Will remain free from infection Outcome: Progressing   Problem: Activity: Goal: Risk for activity intolerance will decrease Outcome: Progressing   

## 2018-01-30 NOTE — Progress Notes (Signed)
SLP Cancellation Note  Patient Details Name: Benjamin [name] MRN: [id number] DOB: 16-Jul-1970   Cancelled treatment:       Reason Eval/Treat Not Completed: Other (comment). Following chart today, given improvement in mental status but ongoing subtle signs of aspiration and weak cough seen on Saturday, plan is to attempt MBS tomorrow for possible diet initiation.   Benjamin Baltimore, MA CCC-SLP  Acute Rehabilitation Services Pager [phone] Office [phone]   Benjamin [name] 01/30/2018, 8:16 AM

## 2018-01-31 ENCOUNTER — Inpatient Hospital Stay (HOSPITAL_COMMUNITY): Payer: Medicaid Other

## 2018-01-31 LAB — CBC
HCT: 50.1 % (ref 39.0–52.0)
Hemoglobin: 15.9 g/dL (ref 13.0–17.0)
MCH: 28.4 pg (ref 26.0–34.0)
MCHC: 31.7 g/dL (ref 30.0–36.0)
MCV: 89.5 fL (ref 80.0–100.0)
Platelets: 369 10*3/uL (ref 150–400)
RBC: 5.6 MIL/uL (ref 4.22–5.81)
RDW: 14 % (ref 11.5–15.5)
WBC: 12.8 10*3/uL — ABNORMAL HIGH (ref 4.0–10.5)
nRBC: 0 % (ref 0.0–0.2)

## 2018-01-31 LAB — COMPREHENSIVE METABOLIC PANEL
ALT: 44 U/L (ref 0–44)
AST: 23 U/L (ref 15–41)
Albumin: 4 g/dL (ref 3.5–5.0)
Alkaline Phosphatase: 60 U/L (ref 38–126)
Anion gap: 13 (ref 5–15)
BUN: 9 mg/dL (ref 6–20)
CO2: 23 mmol/L (ref 22–32)
Calcium: 9.6 mg/dL (ref 8.9–10.3)
Chloride: 102 mmol/L (ref 98–111)
Creatinine, Ser: 1.05 mg/dL (ref 0.61–1.24)
GFR calc Af Amer: >60 mL/min (ref >60)
GFR calc non Af Amer: >60 mL/min (ref >60)
Glucose, Bld: 122 mg/dL — ABNORMAL HIGH (ref 70–99)
Potassium: 4 mmol/L (ref 3.5–5.1)
Sodium: 138 mmol/L (ref 135–145)
Total Bilirubin: 0.9 mg/dL (ref 0.3–1.2)
Total Protein: 7.8 g/dL (ref 6.5–8.1)

## 2018-01-31 LAB — GLUCOSE, CAPILLARY
GLUCOSE-CAPILLARY: 129 mg/dL — AB (ref 70–99)
Glucose-Capillary: 115 mg/dL — ABNORMAL HIGH (ref 70–99)
Glucose-Capillary: 133 mg/dL — ABNORMAL HIGH (ref 70–99)
Glucose-Capillary: 115 mg/dL — ABNORMAL HIGH (ref 70–99)
Glucose-Capillary: 112 mg/dL — ABNORMAL HIGH (ref 70–99)
Glucose-Capillary: 110 mg/dL — ABNORMAL HIGH (ref 70–99)

## 2018-01-31 LAB — PHOSPHORUS: Phosphorus: 3.9 mg/dL (ref 2.5–4.6)

## 2018-01-31 LAB — MAGNESIUM: Magnesium: 2.3 mg/dL (ref 1.7–2.4)

## 2018-01-31 MED ORDER — LORAZEPAM 0.5 MG PO TABS
0.5000 mg | ORAL_TABLET | Freq: Three times a day (TID) | ORAL | Status: DC | PRN
  Administered 2018-01-31 – 2018-02-02 (×5): 0.5 mg via ORAL
  Filled 2018-01-31 (×5): qty 1

## 2018-01-31 MED ORDER — CLONAZEPAM 0.5 MG PO TABS
0.5000 mg | ORAL_TABLET | Freq: Two times a day (BID) | ORAL | Status: DC
  Administered 2018-01-31 – 2018-02-02 (×4): 0.5 mg via ORAL
  Filled 2018-01-31 (×4): qty 1

## 2018-01-31 NOTE — Progress Notes (Signed)
Modified Barium Swallow Progress Note  Patient Details  Name: Benjamin [name] MRN: [id number] Date of Birth: [birth year]  Today's Date: 01/31/2018  Modified Barium Swallow completed.  Full report located under Chart Review in the Imaging Section.  Brief recommendations include the following:  Clinical Impression  Pt demosntrates mild oral deficits related to decreased awareness and generalized weakness complicating self feeding ability and confusion. When taking cup and straw sips, if pt is attentive and aware he can take cup sips of straw sips with hand over hand assist with adequate oral containment. In some trials pt takes sip and does not adequately form the bolus resulting in premature spillage with sensed aspiration before the swallow (x2). Given protective cough pt consistently protected his airway and expect that function will improve as delirium contines to improve. Pt recommended to continue regular diet and thin liquids despite occasional aspiration events. Will f/u for tolerance.    Swallow Evaluation Recommendations       SLP Diet Recommendations: Regular solids;Thin liquid   Liquid Administration via: Cup;No straw   Medication Administration: Crushed with puree   Supervision: Patient able to self feed   Compensations: Slow rate;Small sips/bites   Postural Changes: Seated upright at 90 degrees           Herbie Baltimore, MA CCC-SLP  Acute Rehabilitation Services Pager [phone] Office [phone]  Lynann Beaver 01/31/2018,12:41 PM

## 2018-01-31 NOTE — Progress Notes (Signed)
PROGRESS NOTE                                                                                                                                                                                                             Patient Demographics:    Benjamin [name], is a 48 y.o. male, DOB - 1970/07/28, HUD:[id number]  Admit date - 01/17/2018   Admitting Physician Kipp Brood, MD  Outpatient Primary MD for the patient is Antony Contras, MD  LOS - 33  Chief Complaint  Patient presents with  . Loss of Consciousness       Brief Narrative  From H&P  48 year old male with history of chronic pain and polysubstance abuse coming in with altered mental status from outside hospital.  As per brother patient was in his usual state of health until yesterday afternoon at 2:00 when he went out came back at 7 PM and then around 1 PM patient started becoming very agitated and combative and then went to sleep.  His mother tried to wake him up this morning but he was very altered non-arousable so was brought into [facility] where he was obtunded hypotensive and had a seizure patient was intubated and due to requirement of vasopressors and possible sepsis patient was transferred to [facility] for further management.  He required intubation for a few days along with initial IV pressors and IV fluids.  He was stabilized in the ICU and was transferred to my service on 01/30/2018   Subjective:   Patient in bed, appears comfortable, denies any headache, no fever, no chest pain or pressure, no shortness of breath , no abdominal pain. No focal weakness.    Assessment  & Plan :    1.  Acute toxic encephalopathy caused by intentional baclofen overdose.  Initially requiring intubation for several days for airway protection, also caused hypotension requiring fluids and pressors.  Now stable.  Mentation close to baseline.  CT head, MRI brain nonacute.  Did have evidence of seizure activity on EEG.  Was  kept on Vimpat and Keppra along with CIWA protocol.  Much improved, neuro note noted.  Keppra to be stopped on 02/01/2018.  Vimpat has been discontinued.  Advance activity, PT and speech to evaluate.  Continue suicide precautions with sitter.  Sepsis and infection were ruled out.  2.  Attempted suicide.  Seen by psych inpatient psych facility treatment recommended.  3.  Dysphagia.  improved, remove NG tube and gave him soft diet, swallowed without  any problems on 01/31/2018, placed on regular diet.  4.  Underlying depression.  Continue psych meds.    Family Communication  : None present  Code Status : Full code  Disposition Plan  : MedSurg  Consults  :  PCCM, Psych, neurology  Procedures  :    12/30 > intubated, extubated 01/22/2018 12/31 lumbar puncture> no pleocytosis    CT head 12/30>Multifocal paranasal sinus disease with patient intubated. Brain parenchyma appears unremarkable. No mass or hemorrhage. EEG 12/31> worrisome for burst suppression, recent status epilepticus MRI Brain 1/2> No acute abnormality; no evidence of hypoxic ischemic injury   DVT Prophylaxis  :    Heparin    Lab Results  Component Value Date   PLT 369 01/31/2018    Diet :  Diet Order            Diet regular Room service appropriate? Yes; Fluid consistency: Thin  Diet effective now               Inpatient Medications Scheduled Meds: . busPIRone  10 mg Per Tube TID  . citalopram  20 mg Oral Daily  . clonazePAM  0.5 mg Oral BID  . folic acid  1 mg Per Tube Daily  . heparin injection (subcutaneous)  5,000 Units Subcutaneous Q8H  . levETIRAcetam  500 mg Oral BID  . mouth rinse  15 mL Mouth Rinse BID  . senna-docusate  1 tablet Per Tube BID  . thiamine  100 mg Per Tube Daily   Continuous Infusions: . feeding supplement (VITAL 1.5 CAL) 1,000 mL (01/31/18 0241)   PRN Meds:.acetaminophen, hydrALAZINE, ondansetron (ZOFRAN) IV  Antibiotics  :   Anti-infectives (From admission, onward)    Start     Dose/Rate Route Frequency Ordered Stop   01/21/18 1100  ceFAZolin (ANCEF) IVPB 2g/100 mL premix  Status:  Discontinued     2 g 200 mL/hr over 30 Minutes Intravenous Every 8 hours 01/21/18 0957 01/24/18 1117   01/21/18 1000  cefTRIAXone (ROCEPHIN) 1 g in sodium chloride 0.9 % 100 mL IVPB  Status:  Discontinued     1 g 200 mL/hr over 30 Minutes Intravenous Every 24 hours 01/20/18 1138 01/21/18 0957   01/18/18 1400  valACYclovir (VALTREX) tablet 2,000 mg  Status:  Discontinued     2,000 mg Per Tube 3 times daily 01/18/18 0913 01/18/18 1333   01/18/18 0100  vancomycin (VANCOCIN) IVPB 750 mg/150 ml premix  Status:  Discontinued     750 mg 150 mL/hr over 60 Minutes Intravenous Every 12 hours 01/17/18 2018 01/18/18 1333   01/17/18 2200  cefTRIAXone (ROCEPHIN) 2 g in sodium chloride 0.9 % 100 mL IVPB  Status:  Discontinued     2 g 200 mL/hr over 30 Minutes Intravenous Every 12 hours 01/17/18 2014 01/20/18 1138   01/17/18 2200  acyclovir (ZOVIRAX) 930 mg in dextrose 5 % 150 mL IVPB  Status:  Discontinued     10 mg/kg  93 kg 168.6 mL/hr over 60 Minutes Intravenous Every 8 hours 01/17/18 2031 01/18/18 0913   01/17/18 2015  cefTRIAXone (ROCEPHIN) 2 g in sodium chloride 0.9 % 100 mL IVPB  Status:  Discontinued     2 g 200 mL/hr over 30 Minutes Intravenous Every 24 hours 01/17/18 2003 01/17/18 2014   01/17/18 1200  vancomycin (VANCOCIN) 2,000 mg in sodium chloride 0.9 % 500 mL IVPB     2,000 mg 250 mL/hr over 120 Minutes Intravenous  Once 01/17/18 1145 01/17/18 1452  01/17/18 1145  ceFEPIme (MAXIPIME) 2 g in sodium chloride 0.9 % 100 mL IVPB     2 g 200 mL/hr over 30 Minutes Intravenous  Once 01/17/18 1133 01/17/18 1253   01/17/18 1145  metroNIDAZOLE (FLAGYL) IVPB 500 mg  Status:  Discontinued     500 mg 100 mL/hr over 60 Minutes Intravenous Every 8 hours 01/17/18 1133 01/18/18 0819   01/17/18 1145  vancomycin (VANCOCIN) IVPB 1000 mg/200 mL premix  Status:  Discontinued     1,000 mg 200  mL/hr over 60 Minutes Intravenous  Once 01/17/18 1133 01/17/18 1145          Objective:   Vitals:   01/30/18 1341 01/30/18 2147 01/31/18 0001 01/31/18 0525  BP: 135/84 (!) 154/94 (!) 154/92 (!) 146/90  Pulse: (!) 109 (!) 109    Resp:  (!) 26  (!) 28  Temp: 98.2 F (36.8 C) 98 F (36.7 C) 98.4 F (36.9 C) 98.6 F (37 C)  TempSrc: Oral  Oral Oral  SpO2: 97% 96%  96%  Weight:    77.3 kg  Height:        Wt Readings from Last 3 Encounters:  01/31/18 77.3 kg  10/25/17 93 kg  09/28/17 92.1 kg     Intake/Output Summary (Last 24 hours) at 01/31/2018 1224 Last data filed at 01/31/2018 0921 Gross per 24 hour  Intake 730 ml  Output 1 ml  Net 729 ml     Physical Exam  Awake Alert,  No new F.N deficits, Normal affect [place].AT,PERRAL Supple Neck,No JVD, No cervical lymphadenopathy appriciated.  Symmetrical Chest wall movement, Good air movement bilaterally, CTAB RRR,No Gallops, Rubs or new Murmurs, No Parasternal Heave +ve B.Sounds, Abd Soft, No tenderness, No organomegaly appriciated, No rebound - guarding or rigidity. No Cyanosis, Clubbing or edema, No new Rash or bruise    Data Review:    CBC Recent Labs  Lab 01/31/18 0422  WBC 12.8*  HGB 15.9  HCT 50.1  PLT 369  MCV 89.5  MCH 28.4  MCHC 31.7  RDW 14.0    Chemistries  Recent Labs  Lab 01/26/18 0600 01/27/18 0600 01/28/18 0536 01/29/18 0303 01/31/18 0422  NA 141 139 138 142 138  K 3.0* 3.5 3.2* 3.4* 4.0  CL 110 106 101 109 102  CO2 24 25 26 23 23   GLUCOSE 117* 115* 106* 90 122*  BUN 5* <5* 6 11 9   CREATININE 0.83 0.77 0.87 0.91 1.05  CALCIUM 8.2* 8.6* 8.6* 7.8* 9.6  MG 1.9 1.9 2.0 1.7 2.3  AST  --   --   --   --  23  ALT  --   --   --   --  44  ALKPHOS  --   --   --   --  60  BILITOT  --   --   --   --  0.9   ------------------------------------------------------------------------------------------------------------------ No results for input(s): CHOL, HDL, LDLCALC, TRIG, CHOLHDL, LDLDIRECT in  the last 72 hours.  No results found for: HGBA1C ------------------------------------------------------------------------------------------------------------------ No results for input(s): TSH, T4TOTAL, T3FREE, THYROIDAB in the last 72 hours.  Invalid input(s): FREET3 ------------------------------------------------------------------------------------------------------------------ No results for input(s): VITAMINB12, FOLATE, FERRITIN, TIBC, IRON, RETICCTPCT in the last 72 hours.  Coagulation profile No results for input(s): INR, PROTIME in the last 168 hours.  No results for input(s): DDIMER in the last 72 hours.  Cardiac Enzymes No results for input(s): CKMB, TROPONINI, MYOGLOBIN in the last 168 hours.  Invalid input(s): CK ------------------------------------------------------------------------------------------------------------------  No results found for: BNP  Micro Results No results found for this or any previous visit (from the past 240 hour(s)).  Radiology Reports Ct Head Wo Contrast  Result Date: 01/17/2018 CLINICAL DATA:  Patient found unresponsive EXAM: CT HEAD WITHOUT CONTRAST TECHNIQUE: Contiguous axial images were obtained from the base of the skull through the vertex without intravenous contrast. COMPARISON:  November 17, 2016 FINDINGS: Brain: The ventricles are normal in size and configuration. There is no intracranial mass, hemorrhage, extra-axial fluid collection, or midline shift. Brain parenchyma appears unremarkable. No acute infarct is demonstrable. Vascular: There is no appreciable hyperdense vessel. There is no appreciable vascular calcification. Skull: The bony calvarium appears intact. Sinuses/Orbits: There is mucosal thickening in the sphenoid sinuses bilaterally. There is opacification in multiple ethmoid air cells. There is edema in each naris region. Patient is intubated. Other: Mastoid air cells are clear. IMPRESSION: Multifocal paranasal sinus disease with  patient intubated. Brain parenchyma appears unremarkable. No mass or hemorrhage. Electronically Signed   By: Lowella Grip III M.D.   On: 01/17/2018 14:33   Mr Brain Wo Contrast  Result Date: 01/20/2018 CLINICAL DATA:  48 y/o M; evaluate for anoxic brain damage. History of chronic pain, polysubstance abuse, altered mental status. Positive drug screening for opiates, cocaine, THC. EXAM: MRI HEAD WITHOUT CONTRAST TECHNIQUE: Multiplanar, multiecho pulse sequences of the brain and surrounding structures were obtained without intravenous contrast. COMPARISON:  01/17/2018 CT head. FINDINGS: Brain: No acute infarction, hemorrhage, hydrocephalus, extra-axial collection or mass lesion. No structural or signal abnormality of the brain identified. Mild diffuse brain parenchymal volume loss for age. Vascular: Normal flow voids. Skull and upper cervical spine: Normal marrow signal. Sinuses/Orbits: Sphenoid and left maxillary sinus mucosal thickening. Partial opacification of mastoid air cells. Sinus and mastoid disease is likely due to intubation. Debris within the nasopharynx. Orbits are unremarkable. Other: None. IMPRESSION: 1. No acute intracranial abnormality identified. No findings of hypoxic ischemic injury at this time. 2. Mild diffuse volume loss of the brain. Electronically Signed   By: Kristine Garbe M.D.   On: 01/20/2018 18:30   Dg Chest Port 1 View  Result Date: 01/23/2018 CLINICAL DATA:  Endotracheal tube removal EXAM: PORTABLE CHEST 1 VIEW COMPARISON:  01/20/2018 FINDINGS: Endotracheal tube and nasogastric tube have been removed. Left lung remains clear. Moderate atelectasis/infiltrate at the right lung base appears similar. No worsening or new finding. IMPRESSION: Endotracheal tube and nasogastric tube removed. Persistent right base atelectasis/infiltrate. Electronically Signed   By: Nelson Chimes M.D.   On: 01/23/2018 06:38   Dg Chest Port 1 View  Result Date: 01/20/2018 CLINICAL DATA:   48 year old male with respiratory failure. Right lung base opacity. Found down, obtunded. Seizures, possible anoxic injury. EXAM: PORTABLE CHEST 1 VIEW COMPARISON:  01/19/2018 and earlier. FINDINGS: Portable AP semi upright view at 0438 hours. Stable endotracheal tube tip at the level the clavicles. Enteric tube courses to the abdomen, tip not included. Underlying elevated right hemidiaphragm but there is patchy airspace opacity superimposed at the right lung base, infrahilar. No pneumothorax or pulmonary edema. The left lung remains clear allowing for portable technique. No definite pleural effusion. Mediastinal contours remain normal. Stable visualized osseous structures. IMPRESSION: 1. Continued airspace opacity at the right lung base superimposed on elevated hemidiaphragm. Differential considerations include infection, aspiration, and atelectasis. 2. No other No acute cardiopulmonary abnormality. 3.  Stable lines and tubes. Electronically Signed   By: Genevie Ann M.D.   On: 01/20/2018 07:02   Dg Chest [facility] 1 [address]  Result Date: 01/19/2018 CLINICAL DATA:  Respiratory failure. EXAM: PORTABLE CHEST 1 VIEW COMPARISON:  01/18/2018 FINDINGS: Endotracheal tube terminates at the inferior margin of the clavicular heads, unchanged. Enteric tube terminates over the proximal stomach. The cardiac silhouette is normal in size. There is elevation of the right hemidiaphragm with increasing opacity in the right lung base. No large pleural effusion or pneumothorax is identified. An old left clavicle fracture is noted. IMPRESSION: Worsening right basilar airspace disease. Electronically Signed   By: Logan Bores M.D.   On: 01/19/2018 08:08   Dg Chest Port 1 View  Result Date: 01/18/2018 CLINICAL DATA:  Intubation. EXAM: PORTABLE CHEST 1 VIEW COMPARISON:  01/17/2018. FINDINGS: Endotracheal tube in stable position. NG tube noted with tip below left hemidiaphragm. Heart size stable. Bibasilar atelectasis/infiltrates. No pleural  effusion or pneumothorax. IMPRESSION: 1. Endotracheal tube in stable position. NG tube noted with tip below left hemidiaphragm. 2.  Bibasilar atelectasis/infiltrates.  New from prior exam. Electronically Signed   By: [place]   On: 01/18/2018 05:25   Dg Chest Port 1 View  Result Date: 01/17/2018 CLINICAL DATA:  Altered mental status. Status post intubation. EXAM: PORTABLE CHEST 1 VIEW COMPARISON:  Chest x-ray dated 02/07/2013 FINDINGS: Endotracheal tube tip is at the level of the thoracic inlet. Heart size and vascularity are normal. Lungs are clear. No acute bone abnormality. Old healed fracture of the left clavicle. IMPRESSION: Endotracheal tube in good position. Clear lungs. Electronically Signed   By: Lorriane Shire M.D.   On: 01/17/2018 11:59   Dg Abd Portable 1v  Result Date: 01/26/2018 CLINICAL DATA:  Feeding tube placement EXAM: PORTABLE ABDOMEN - 1 VIEW COMPARISON:  None. FINDINGS: There is a feeding tube with the tip projecting over the antrum of the stomach. There is no bowel dilatation to suggest obstruction. There is no evidence of pneumoperitoneum, portal venous gas or pneumatosis. There are no pathologic calcifications along the expected course of the ureters. The osseous structures are unremarkable. IMPRESSION: There is a feeding tube with the tip projecting over the antrum of the stomach. Electronically Signed   By: Kathreen Devoid   On: 01/26/2018 11:12   Dg Abd Portable 1v  Result Date: 01/18/2018 CLINICAL DATA:  High NG output EXAM: PORTABLE ABDOMEN - 1 VIEW COMPARISON:  CT 08/26/2017 FINDINGS: Esophageal tube tip overlies the gastric fundus. Airspace disease at the right lung base. Nonobstructed bowel gas pattern with overall decreased bowel gas. Scattered colon gas is noted. IMPRESSION: 1. Esophageal tube tip overlies the gastric fundus. 2. Nonobstructed gas pattern 3. Airspace disease at the right lung base. Electronically Signed   By: Donavan Foil M.D.   On: 01/18/2018  19:56   Dg Abd Portable 2v  Result Date: 01/21/2018 CLINICAL DATA:  Cough. EXAM: PORTABLE ABDOMEN - 2 VIEW COMPARISON:  01/18/2018. FINDINGS: NG tube noted coiled stomach. Soft tissue structures are unremarkable. No bowel distention. No free air. Lumbar spine scoliosis concave right. IMPRESSION: 1.  NG tube noted coiled stomach. 2.  No acute intra-abdominal abnormality identified. Electronically Signed   By: Marcello Moores  Register   On: 01/21/2018 15:54   Dg Fluoro Guide Lumbar Puncture  Result Date: 01/18/2018 CLINICAL DATA:  48 year old male found of tongue did. Seizure. Possible sepsis, meningitis. Intubated. Unsuccessful bedside lumbar puncture. EXAM: DIAGNOSTIC LUMBAR PUNCTURE UNDER FLUOROSCOPIC GUIDANCE FLUOROSCOPY TIME:  Fluoroscopy Time:  0 minutes 6 seconds Radiation Exposure Index (if provided by the fluoroscopic device): Number of Acquired Spot Images: 0 PROCEDURE: Informed consent was obtained from  the patient's mother via telephone prior to the procedure, including potential complications of headache, allergy, and pain. A "time-out" was performed. With the patient intubated and prone, the lower back was prepped with Betadine. 1% Lidocaine was used for local anesthesia. Lumbar puncture was performed at the L2-L3 level using left sub laminar technique and a 3.5 in x 20 gauge needle which had to be hubbed. Return of clear, colorless CSF with an elevated opening pressure of 35-36 cm water. 18 mL of CSF were obtained for laboratory studies. The patient tolerated the procedure well and there were no apparent complications. The patient was returned to the ICU in stable condition for continued treatment. IMPRESSION: Lumbar puncture with fluoroscopic guidance at L2-L3. Elevated opening pressure (although measured while prone and intubated) estimated at 35-36 cm of water. 18 mL of clear, colorless CSF obtained for laboratory studies. Electronically Signed   By: Genevie Ann M.D.   On: 01/18/2018 11:29    Time  Spent in minutes  30   Lala Lund M.D on 01/31/2018 at 12:24 PM  To page go to [email or web address] - password [facility]

## 2018-01-31 NOTE — Progress Notes (Signed)
Physical Therapy Treatment Patient Details Name: Benjamin [name] MRN: [id number] DOB: Sep 16, 1970 Today's Date: 01/31/2018    History of Present Illness 48 y/o male admitted with encephalopathy with Sz. Intubated 12/30-1/4. PMhx: polysubstance abuse, depression, GERD, solitary kidney, bad back with pain    PT Comments    Pt is up to walk with sitter in the room to maintain a safe environment for him.  Pt used RW first sidestepping then to walk away from bed with dense cues for safety and direction of walker.  Given his persistent safety cues needed to maneuver, will continue to recommend SNF for short stay to increase his safety and control of gait.  Pt is motivated to try and should continue to make progress as he has been instructed.   Follow Up Recommendations  SNF     Equipment Recommendations  Wheelchair (measurements PT);Wheelchair cushion (measurements PT);Hospital bed    Recommendations for Other Services       Precautions / Restrictions Precautions Precautions: Fall Precaution Comments: sitter Restrictions Weight Bearing Restrictions: No    Mobility  Bed Mobility Overal bed mobility: Needs Assistance Bed Mobility: Supine to Sit;Sit to Supine Rolling: Min assist   Supine to sit: Min assist Sit to supine: Min assist   General bed mobility comments: help to scoot out to EOB  Transfers Overall transfer level: Needs assistance Equipment used: Rolling walker (2 wheeled);1 person hand held assist Transfers: Sit to/from Stand Sit to Stand: Mod assist         General transfer comment: stood with mod assist initially then min assist as he became more steady  Ambulation/Gait Ambulation/Gait assistance: Min assist Gait Distance (Feet): 40 Feet(+8) Assistive device: Rolling walker (2 wheeled);1 person hand held assist Gait Pattern/deviations: Step-through pattern;Step-to pattern;Decreased stride length;Wide base of support Gait velocity: reduced Gait velocity  interpretation: <1.31 ft/sec, indicative of household ambulator General Gait Details: sidesteps first then away from bed   Stairs             Wheelchair Mobility    Modified Rankin (Stroke Patients Only)       Balance Overall balance assessment: Needs assistance Sitting-balance support: Feet supported Sitting balance-Leahy Scale: Good     Standing balance support: Bilateral upper extremity supported Standing balance-Leahy Scale: Poor                              Cognition Arousal/Alertness: Awake/alert Behavior During Therapy: Flat affect Overall Cognitive Status: Impaired/Different from baseline Area of Impairment: Safety/judgement;Awareness;Problem solving                         Safety/Judgement: Decreased awareness of safety;Decreased awareness of deficits Awareness: Intellectual Problem Solving: Slow processing;Difficulty sequencing;Requires verbal cues;Requires tactile cues General Comments: requires some cues for each step of walking process, including direction of walker      Exercises      General Comments General comments (skin integrity, edema, etc.): pt looked unsteady initially so did sidesteps side of bed, then away from bed as he regained awareness of the use of walker      Pertinent Vitals/Pain Pain Assessment: No/denies pain    Home Living                      Prior Function            PT Goals (current goals can now be found in the care plan section)  Progress towards PT goals: Progressing toward goals    Frequency    Min 2X/week      PT Plan Current plan remains appropriate    Co-evaluation              AM-PAC PT "6 Clicks" Mobility   Outcome Measure  Help needed turning from your back to your side while in a flat bed without using bedrails?: A Little Help needed moving from lying on your back to sitting on the side of a flat bed without using bedrails?: A Little Help needed moving to  and from a bed to a chair (including a wheelchair)?: A Lot Help needed standing up from a chair using your arms (e.g., wheelchair or bedside chair)?: A Lot Help needed to walk in hospital room?: A Lot Help needed climbing 3-5 steps with a railing? : A Lot 6 Click Score: 14    End of Session Equipment Utilized During Treatment: Gait belt Activity Tolerance: Patient tolerated treatment well;Patient limited by fatigue Patient left: in bed;with call bell/phone within reach;with bed alarm set;with nursing/sitter in room Nurse Communication: Mobility status PT Visit Diagnosis: Muscle weakness (generalized) (M62.81);Difficulty in walking, not elsewhere classified (R26.2);Pain     Time: 1531-1555 PT Time Calculation (min) (ACUTE ONLY): 24 min  Charges:  $Gait Training: 8-22 mins $Therapeutic Activity: 8-22 mins                    Ramond Dial 01/31/2018, 5:22 PM  Mee Hives, PT MS Acute Rehab Dept. Number: [place] [facility] [place]

## 2018-02-01 LAB — GLUCOSE, CAPILLARY
GLUCOSE-CAPILLARY: 98 mg/dL (ref 70–99)
Glucose-Capillary: 110 mg/dL — ABNORMAL HIGH (ref 70–99)
Glucose-Capillary: 135 mg/dL — ABNORMAL HIGH (ref 70–99)
Glucose-Capillary: 169 mg/dL — ABNORMAL HIGH (ref 70–99)
Glucose-Capillary: 118 mg/dL — ABNORMAL HIGH (ref 70–99)
Glucose-Capillary: 126 mg/dL — ABNORMAL HIGH (ref 70–99)

## 2018-02-01 MED ORDER — NICOTINE 14 MG/24HR TD PT24
14.0000 mg | MEDICATED_PATCH | Freq: Every day | TRANSDERMAL | Status: DC
  Administered 2018-02-01 (×2): 14 mg via TRANSDERMAL
  Filled 2018-02-01 (×2): qty 1

## 2018-02-01 MED ORDER — ENSURE ENLIVE PO LIQD
237.0000 mL | Freq: Two times a day (BID) | ORAL | Status: DC
  Administered 2018-02-02 (×2): 237 mL via ORAL

## 2018-02-01 MED ORDER — LIDOCAINE 5 % EX PTCH
1.0000 | MEDICATED_PATCH | CUTANEOUS | Status: DC
  Administered 2018-02-01 – 2018-02-02 (×2): 1 via TRANSDERMAL
  Filled 2018-02-01 (×2): qty 1

## 2018-02-01 MED ORDER — ADULT MULTIVITAMIN W/MINERALS CH
1.0000 | ORAL_TABLET | Freq: Every day | ORAL | Status: DC
  Administered 2018-02-02: 1 via ORAL
  Filled 2018-02-01: qty 1

## 2018-02-01 NOTE — Progress Notes (Signed)
[facility] reviewing referral.  Cedric Fishman LCSW [phone]

## 2018-02-01 NOTE — Progress Notes (Addendum)
Nutrition Follow-up  DOCUMENTATION CODES:   Not applicable  INTERVENTION:   D/c Tube feeding orders  Ensure Enlive po BID, each supplement provides 350 kcal and 20 grams of protein  Recommend Multivitamin daily   Encourage po intake  NUTRITION DIAGNOSIS:   Inadequate oral intake related to acute illness as evidenced by NPO status.  Diet advanced  GOAL:   Patient will meet greater than or equal to 90% of their needs  Progressing  MONITOR:   PO intake, Supplement acceptance, Labs, Weight trends, I & O's   ASSESSMENT:    48 yo male admitted with AMS and new onset seizures requiring intubation, concern for meningitis/encephaliti. +cocaine, THC, opiates. PMH includes spinal stenosis, diverticulitis, gastric ulcer, IBS   1/12 Stabilized and transferred to [facility] 1/13 Cortrak Removed, pt on regular diet per SLP  DI discussed pt with RN. RN reported that pt only ate about 45% of his meal this morning she believed due to not being quite back to baseline mentally. Believes pt will benefit from a nutrition supplement. DI to order Ensures.   Noted weight difference between ICU and floor. Could be bed scale differences.   Medications reviewed and include: Folic Acid  Senokot Thiamine   Labs reviewed: CBGs: 118, 169,135,110,110 X 12 hours    Diet Order:   Diet Order            Diet regular Room service appropriate? Yes; Fluid consistency: Thin  Diet effective now              EDUCATION NEEDS:   Not appropriate for education at this time  Skin:  Skin Assessment: Reviewed RN Assessment  Last BM:  1/13- Type 7  Height:   Ht Readings from Last 1 Encounters:  01/17/18 5\' 8"  (1.727 m)    Weight:   Wt Readings from Last 1 Encounters:  01/31/18 77.3 kg    Ideal Body Weight:  70 kg  BMI:  Body mass index is 25.91 kg/m.  Estimated Nutritional Needs:   Kcal:  2100-2400 kcals   Protein:  105-120  Fluid:  >/= 2 L  Mauricia Area, MS, Dietetic  Intern Pager: [phone] After hours Pager: [phone]

## 2018-02-01 NOTE — Progress Notes (Signed)
CSW received consult regarding inpatient psych placement. Since PT is recommending SNF, CSW requested RN ambulate patient today to see if he will be safe at adult inpatient psych or if he will require an geropsych placement.   Percell Locus Dashawn Bartnick LCSW [phone]

## 2018-02-01 NOTE — Progress Notes (Signed)
PROGRESS NOTE                                                                                                                                                                                                             Patient Demographics:    Benjamin [name], is a 48 y.o. male, DOB - 1970-06-26, ATF:[id number]  Admit date - 01/17/2018   Admitting Physician Kipp Brood, MD  Outpatient Primary MD for the patient is Antony Contras, MD  LOS - 15  Chief Complaint  Patient presents with  . Loss of Consciousness       Brief Narrative  From H&P  48 year old male with history of chronic pain and polysubstance abuse coming in with altered mental status from outside hospital.  As per brother patient was in his usual state of health until yesterday afternoon at 2:00 when he went out came back at 7 PM and then around 45 PM patient started becoming very agitated and combative and then went to sleep.  His mother tried to wake him up this morning but he was very altered non-arousable so was brought into [facility] where he was obtunded hypotensive and had a seizure patient was intubated and due to requirement of vasopressors and possible sepsis patient was transferred to [facility] for further management.  He required intubation for a few days along with initial IV pressors and IV fluids.  He was stabilized in the ICU and was transferred to my service on 01/30/2018   Subjective:   Patient in bed, appears comfortable, denies any headache, no fever, no chest pain or pressure, no shortness of breath , no abdominal pain. No focal weakness.   Assessment  & Plan :    1.  Acute toxic encephalopathy caused by intentional baclofen overdose.  Initially requiring intubation for several days for airway protection, also caused hypotension requiring fluids and pressors.  Now stable.  Mentation close to baseline.  CT head, MRI brain nonacute.  Did have evidence of seizure activity on EEG.  Was kept  on Vimpat and Keppra along with CIWA protocol.  Much improved, neuro note noted.  Keppra to be stopped on 02/01/2018.  Vimpat has been discontinued.  Advance activity, PT and speech to evaluate.  Continue suicide precautions with sitter.  Sepsis and infection were ruled out.  2.  Attempted suicide.  Seen by psych inpatient psych facility treatment recommended.  3.  Dysphagia.  improved, remove NG tube and gave him soft diet, swallowed without any  problems on 01/31/2018, placed on regular diet.  4.  Underlying depression.  Continue psych meds.  Patient is medically cleared for her to be placed to inpatient psych facility.    Family Communication  : None present  Code Status : Full code  Disposition Plan  :  Inpt Psych Bed  Consults  :  PCCM, Psych, neurology  Procedures  :    12/30 > intubated, extubated 01/22/2018 12/31 lumbar puncture> no pleocytosis    CT head 12/30>Multifocal paranasal sinus disease with patient intubated. Brain parenchyma appears unremarkable. No mass or hemorrhage. EEG 12/31> worrisome for burst suppression, recent status epilepticus MRI Brain 1/2> No acute abnormality; no evidence of hypoxic ischemic injury   DVT Prophylaxis  :    Heparin    Lab Results  Component Value Date   PLT 369 01/31/2018    Diet :  Diet Order            Diet regular Room service appropriate? Yes; Fluid consistency: Thin  Diet effective now               Inpatient Medications Scheduled Meds: . busPIRone  10 mg Per Tube TID  . citalopram  20 mg Oral Daily  . clonazePAM  0.5 mg Oral BID  . folic acid  1 mg Per Tube Daily  . heparin injection (subcutaneous)  5,000 Units Subcutaneous Q8H  . lidocaine  1 patch Transdermal Q24H  . mouth rinse  15 mL Mouth Rinse BID  . nicotine  14 mg Transdermal QHS  . senna-docusate  1 tablet Per Tube BID  . thiamine  100 mg Per Tube Daily   Continuous Infusions: . feeding supplement (VITAL 1.5 CAL) 1,000 mL (01/31/18 0241)    PRN Meds:.acetaminophen, hydrALAZINE, LORazepam, ondansetron (ZOFRAN) IV  Antibiotics  :   Anti-infectives (From admission, onward)   Start     Dose/Rate Route Frequency Ordered Stop   01/21/18 1100  ceFAZolin (ANCEF) IVPB 2g/100 mL premix  Status:  Discontinued     2 g 200 mL/hr over 30 Minutes Intravenous Every 8 hours 01/21/18 0957 01/24/18 1117   01/21/18 1000  cefTRIAXone (ROCEPHIN) 1 g in sodium chloride 0.9 % 100 mL IVPB  Status:  Discontinued     1 g 200 mL/hr over 30 Minutes Intravenous Every 24 hours 01/20/18 1138 01/21/18 0957   01/18/18 1400  valACYclovir (VALTREX) tablet 2,000 mg  Status:  Discontinued     2,000 mg Per Tube 3 times daily 01/18/18 0913 01/18/18 1333   01/18/18 0100  vancomycin (VANCOCIN) IVPB 750 mg/150 ml premix  Status:  Discontinued     750 mg 150 mL/hr over 60 Minutes Intravenous Every 12 hours 01/17/18 2018 01/18/18 1333   01/17/18 2200  cefTRIAXone (ROCEPHIN) 2 g in sodium chloride 0.9 % 100 mL IVPB  Status:  Discontinued     2 g 200 mL/hr over 30 Minutes Intravenous Every 12 hours 01/17/18 2014 01/20/18 1138   01/17/18 2200  acyclovir (ZOVIRAX) 930 mg in dextrose 5 % 150 mL IVPB  Status:  Discontinued     10 mg/kg  93 kg 168.6 mL/hr over 60 Minutes Intravenous Every 8 hours 01/17/18 2031 01/18/18 0913   01/17/18 2015  cefTRIAXone (ROCEPHIN) 2 g in sodium chloride 0.9 % 100 mL IVPB  Status:  Discontinued     2 g 200 mL/hr over 30 Minutes Intravenous Every 24 hours 01/17/18 2003 01/17/18 2014   01/17/18 1200  vancomycin (VANCOCIN) 2,000 mg in sodium chloride  0.9 % 500 mL IVPB     2,000 mg 250 mL/hr over 120 Minutes Intravenous  Once 01/17/18 1145 01/17/18 1452   01/17/18 1145  ceFEPIme (MAXIPIME) 2 g in sodium chloride 0.9 % 100 mL IVPB     2 g 200 mL/hr over 30 Minutes Intravenous  Once 01/17/18 1133 01/17/18 1253   01/17/18 1145  metroNIDAZOLE (FLAGYL) IVPB 500 mg  Status:  Discontinued     500 mg 100 mL/hr over 60 Minutes Intravenous Every 8  hours 01/17/18 1133 01/18/18 0819   01/17/18 1145  vancomycin (VANCOCIN) IVPB 1000 mg/200 mL premix  Status:  Discontinued     1,000 mg 200 mL/hr over 60 Minutes Intravenous  Once 01/17/18 1133 01/17/18 1145          Objective:   Vitals:   01/31/18 0525 01/31/18 1401 01/31/18 2215 02/01/18 0436  BP: (!) 146/90 (!) 141/91 (!) 138/98 139/87  Pulse:  97 93 (!) 120  Resp: (!) 28 16 20 18   Temp: 98.6 F (37 C) 99.6 F (37.6 C) 98.1 F (36.7 C) 97.7 F (36.5 C)  TempSrc: Oral  Oral Oral  SpO2: 96% 96% 97% 96%  Weight: 77.3 kg     Height:        Wt Readings from Last 3 Encounters:  01/31/18 77.3 kg  10/25/17 93 kg  09/28/17 92.1 kg    No intake or output data in the 24 hours ending 02/01/18 1025   Physical Exam  Awake Alert, Oriented X 3, No new F.N deficits,   [place].AT,PERRAL Supple Neck,No JVD, No cervical lymphadenopathy appriciated.  Symmetrical Chest wall movement, Good air movement bilaterally, CTAB RRR,No Gallops, Rubs or new Murmurs, No Parasternal Heave +ve B.Sounds, Abd Soft, No tenderness, No organomegaly appriciated, No rebound - guarding or rigidity. No Cyanosis, Clubbing or edema, No new Rash or bruise    Data Review:    CBC Recent Labs  Lab 01/31/18 0422  WBC 12.8*  HGB 15.9  HCT 50.1  PLT 369  MCV 89.5  MCH 28.4  MCHC 31.7  RDW 14.0    Chemistries  Recent Labs  Lab 01/26/18 0600 01/27/18 0600 01/28/18 0536 01/29/18 0303 01/31/18 0422  NA 141 139 138 142 138  K 3.0* 3.5 3.2* 3.4* 4.0  CL 110 106 101 109 102  CO2 24 25 26 23 23   GLUCOSE 117* 115* 106* 90 122*  BUN 5* <5* 6 11 9   CREATININE 0.83 0.77 0.87 0.91 1.05  CALCIUM 8.2* 8.6* 8.6* 7.8* 9.6  MG 1.9 1.9 2.0 1.7 2.3  AST  --   --   --   --  23  ALT  --   --   --   --  44  ALKPHOS  --   --   --   --  60  BILITOT  --   --   --   --  0.9   ------------------------------------------------------------------------------------------------------------------ No results for  input(s): CHOL, HDL, LDLCALC, TRIG, CHOLHDL, LDLDIRECT in the last 72 hours.  No results found for: HGBA1C ------------------------------------------------------------------------------------------------------------------ No results for input(s): TSH, T4TOTAL, T3FREE, THYROIDAB in the last 72 hours.  Invalid input(s): FREET3 ------------------------------------------------------------------------------------------------------------------ No results for input(s): VITAMINB12, FOLATE, FERRITIN, TIBC, IRON, RETICCTPCT in the last 72 hours.  Coagulation profile No results for input(s): INR, PROTIME in the last 168 hours.  No results for input(s): DDIMER in the last 72 hours.  Cardiac Enzymes No results for input(s): CKMB, TROPONINI, MYOGLOBIN in the last 168 hours.  Invalid input(s): CK ------------------------------------------------------------------------------------------------------------------ No results found for: BNP  Micro Results No results found for this or any previous visit (from the past 240 hour(s)).  Radiology Reports Ct Head Wo Contrast  Result Date: 01/17/2018 CLINICAL DATA:  Patient found unresponsive EXAM: CT HEAD WITHOUT CONTRAST TECHNIQUE: Contiguous axial images were obtained from the base of the skull through the vertex without intravenous contrast. COMPARISON:  November 17, 2016 FINDINGS: Brain: The ventricles are normal in size and configuration. There is no intracranial mass, hemorrhage, extra-axial fluid collection, or midline shift. Brain parenchyma appears unremarkable. No acute infarct is demonstrable. Vascular: There is no appreciable hyperdense vessel. There is no appreciable vascular calcification. Skull: The bony calvarium appears intact. Sinuses/Orbits: There is mucosal thickening in the sphenoid sinuses bilaterally. There is opacification in multiple ethmoid air cells. There is edema in each naris region. Patient is intubated. Other: Mastoid air cells are  clear. IMPRESSION: Multifocal paranasal sinus disease with patient intubated. Brain parenchyma appears unremarkable. No mass or hemorrhage. Electronically Signed   By: Lowella Grip III M.D.   On: 01/17/2018 14:33   Mr Brain Wo Contrast  Result Date: 01/20/2018 CLINICAL DATA:  48 y/o M; evaluate for anoxic brain damage. History of chronic pain, polysubstance abuse, altered mental status. Positive drug screening for opiates, cocaine, THC. EXAM: MRI HEAD WITHOUT CONTRAST TECHNIQUE: Multiplanar, multiecho pulse sequences of the brain and surrounding structures were obtained without intravenous contrast. COMPARISON:  01/17/2018 CT head. FINDINGS: Brain: No acute infarction, hemorrhage, hydrocephalus, extra-axial collection or mass lesion. No structural or signal abnormality of the brain identified. Mild diffuse brain parenchymal volume loss for age. Vascular: Normal flow voids. Skull and upper cervical spine: Normal marrow signal. Sinuses/Orbits: Sphenoid and left maxillary sinus mucosal thickening. Partial opacification of mastoid air cells. Sinus and mastoid disease is likely due to intubation. Debris within the nasopharynx. Orbits are unremarkable. Other: None. IMPRESSION: 1. No acute intracranial abnormality identified. No findings of hypoxic ischemic injury at this time. 2. Mild diffuse volume loss of the brain. Electronically Signed   By: Kristine Garbe M.D.   On: 01/20/2018 18:30   Dg Chest Port 1 View  Result Date: 01/23/2018 CLINICAL DATA:  Endotracheal tube removal EXAM: PORTABLE CHEST 1 VIEW COMPARISON:  01/20/2018 FINDINGS: Endotracheal tube and nasogastric tube have been removed. Left lung remains clear. Moderate atelectasis/infiltrate at the right lung base appears similar. No worsening or new finding. IMPRESSION: Endotracheal tube and nasogastric tube removed. Persistent right base atelectasis/infiltrate. Electronically Signed   By: Nelson Chimes M.D.   On: 01/23/2018 06:38   Dg Chest  Port 1 View  Result Date: 01/20/2018 CLINICAL DATA:  48 year old male with respiratory failure. Right lung base opacity. Found down, obtunded. Seizures, possible anoxic injury. EXAM: PORTABLE CHEST 1 VIEW COMPARISON:  01/19/2018 and earlier. FINDINGS: Portable AP semi upright view at 0438 hours. Stable endotracheal tube tip at the level the clavicles. Enteric tube courses to the abdomen, tip not included. Underlying elevated right hemidiaphragm but there is patchy airspace opacity superimposed at the right lung base, infrahilar. No pneumothorax or pulmonary edema. The left lung remains clear allowing for portable technique. No definite pleural effusion. Mediastinal contours remain normal. Stable visualized osseous structures. IMPRESSION: 1. Continued airspace opacity at the right lung base superimposed on elevated hemidiaphragm. Differential considerations include infection, aspiration, and atelectasis. 2. No other No acute cardiopulmonary abnormality. 3.  Stable lines and tubes. Electronically Signed   By: Genevie Ann M.D.   On: 01/20/2018 07:02   Dg  Chest Port 1 View  Result Date: 01/19/2018 CLINICAL DATA:  Respiratory failure. EXAM: PORTABLE CHEST 1 VIEW COMPARISON:  01/18/2018 FINDINGS: Endotracheal tube terminates at the inferior margin of the clavicular heads, unchanged. Enteric tube terminates over the proximal stomach. The cardiac silhouette is normal in size. There is elevation of the right hemidiaphragm with increasing opacity in the right lung base. No large pleural effusion or pneumothorax is identified. An old left clavicle fracture is noted. IMPRESSION: Worsening right basilar airspace disease. Electronically Signed   By: Logan Bores M.D.   On: 01/19/2018 08:08   Dg Chest Port 1 View  Result Date: 01/18/2018 CLINICAL DATA:  Intubation. EXAM: PORTABLE CHEST 1 VIEW COMPARISON:  01/17/2018. FINDINGS: Endotracheal tube in stable position. NG tube noted with tip below left hemidiaphragm. Heart size  stable. Bibasilar atelectasis/infiltrates. No pleural effusion or pneumothorax. IMPRESSION: 1. Endotracheal tube in stable position. NG tube noted with tip below left hemidiaphragm. 2.  Bibasilar atelectasis/infiltrates.  New from prior exam. Electronically Signed   By: [place]   On: 01/18/2018 05:25   Dg Chest Port 1 View  Result Date: 01/17/2018 CLINICAL DATA:  Altered mental status. Status post intubation. EXAM: PORTABLE CHEST 1 VIEW COMPARISON:  Chest x-ray dated 02/07/2013 FINDINGS: Endotracheal tube tip is at the level of the thoracic inlet. Heart size and vascularity are normal. Lungs are clear. No acute bone abnormality. Old healed fracture of the left clavicle. IMPRESSION: Endotracheal tube in good position. Clear lungs. Electronically Signed   By: Lorriane Shire M.D.   On: 01/17/2018 11:59   Dg Abd Portable 1v  Result Date: 01/26/2018 CLINICAL DATA:  Feeding tube placement EXAM: PORTABLE ABDOMEN - 1 VIEW COMPARISON:  None. FINDINGS: There is a feeding tube with the tip projecting over the antrum of the stomach. There is no bowel dilatation to suggest obstruction. There is no evidence of pneumoperitoneum, portal venous gas or pneumatosis. There are no pathologic calcifications along the expected course of the ureters. The osseous structures are unremarkable. IMPRESSION: There is a feeding tube with the tip projecting over the antrum of the stomach. Electronically Signed   By: Kathreen Devoid   On: 01/26/2018 11:12   Dg Abd Portable 1v  Result Date: 01/18/2018 CLINICAL DATA:  High NG output EXAM: PORTABLE ABDOMEN - 1 VIEW COMPARISON:  CT 08/26/2017 FINDINGS: Esophageal tube tip overlies the gastric fundus. Airspace disease at the right lung base. Nonobstructed bowel gas pattern with overall decreased bowel gas. Scattered colon gas is noted. IMPRESSION: 1. Esophageal tube tip overlies the gastric fundus. 2. Nonobstructed gas pattern 3. Airspace disease at the right lung base. Electronically  Signed   By: Donavan Foil M.D.   On: 01/18/2018 19:56   Dg Abd Portable 2v  Result Date: 01/21/2018 CLINICAL DATA:  Cough. EXAM: PORTABLE ABDOMEN - 2 VIEW COMPARISON:  01/18/2018. FINDINGS: NG tube noted coiled stomach. Soft tissue structures are unremarkable. No bowel distention. No free air. Lumbar spine scoliosis concave right. IMPRESSION: 1.  NG tube noted coiled stomach. 2.  No acute intra-abdominal abnormality identified. Electronically Signed   By: Marcello Moores  Register   On: 01/21/2018 15:54   Dg Fluoro Guide Lumbar Puncture  Result Date: 01/18/2018 CLINICAL DATA:  48 year old male found of tongue did. Seizure. Possible sepsis, meningitis. Intubated. Unsuccessful bedside lumbar puncture. EXAM: DIAGNOSTIC LUMBAR PUNCTURE UNDER FLUOROSCOPIC GUIDANCE FLUOROSCOPY TIME:  Fluoroscopy Time:  0 minutes 6 seconds Radiation Exposure Index (if provided by the fluoroscopic device): Number of Acquired Spot Images: 0 PROCEDURE:  Informed consent was obtained from the patient's mother via telephone prior to the procedure, including potential complications of headache, allergy, and pain. A "time-out" was performed. With the patient intubated and prone, the lower back was prepped with Betadine. 1% Lidocaine was used for local anesthesia. Lumbar puncture was performed at the L2-L3 level using left sub laminar technique and a 3.5 in x 20 gauge needle which had to be hubbed. Return of clear, colorless CSF with an elevated opening pressure of 35-36 cm water. 18 mL of CSF were obtained for laboratory studies. The patient tolerated the procedure well and there were no apparent complications. The patient was returned to the ICU in stable condition for continued treatment. IMPRESSION: Lumbar puncture with fluoroscopic guidance at L2-L3. Elevated opening pressure (although measured while prone and intubated) estimated at 35-36 cm of water. 18 mL of clear, colorless CSF obtained for laboratory studies. Electronically Signed   By: Genevie Ann M.D.   On: 01/18/2018 11:29    Time Spent in minutes  30   Lala Lund M.D on 02/01/2018 at 10:25 AM  To page go to [email or web address] - password [facility]

## 2018-02-02 ENCOUNTER — Other Ambulatory Visit: Payer: Self-pay

## 2018-02-02 ENCOUNTER — Encounter (HOSPITAL_COMMUNITY): Payer: Self-pay

## 2018-02-02 ENCOUNTER — Other Ambulatory Visit: Payer: Self-pay | Admitting: Registered Nurse

## 2018-02-02 ENCOUNTER — Inpatient Hospital Stay (HOSPITAL_COMMUNITY)
Admission: AD | Admit: 2018-02-02 | Discharge: 2018-02-07 | DRG: 885 | Disposition: A | Discharge status: Home / Self Care | Payer: Medicaid Other | Source: Intra-hospital | Attending: Psychiatry | Admitting: Psychiatry

## 2018-02-02 DIAGNOSIS — I10 Essential (primary) hypertension: Secondary | ICD-10-CM | POA: Diagnosis present

## 2018-02-02 DIAGNOSIS — G4700 Insomnia, unspecified: Secondary | ICD-10-CM | POA: Diagnosis not present

## 2018-02-02 DIAGNOSIS — Z79899 Other long term (current) drug therapy: Secondary | ICD-10-CM

## 2018-02-02 DIAGNOSIS — J9601 Acute respiratory failure with hypoxia: Secondary | ICD-10-CM | POA: Diagnosis not present

## 2018-02-02 DIAGNOSIS — M479 Spondylosis, unspecified: Secondary | ICD-10-CM | POA: Diagnosis present

## 2018-02-02 DIAGNOSIS — J45909 Unspecified asthma, uncomplicated: Secondary | ICD-10-CM | POA: Diagnosis present

## 2018-02-02 DIAGNOSIS — E569 Vitamin deficiency, unspecified: Secondary | ICD-10-CM | POA: Diagnosis present

## 2018-02-02 DIAGNOSIS — Z915 Personal history of self-harm: Secondary | ICD-10-CM

## 2018-02-02 DIAGNOSIS — T1491XA Suicide attempt, initial encounter: Secondary | ICD-10-CM | POA: Diagnosis not present

## 2018-02-02 DIAGNOSIS — Z8711 Personal history of peptic ulcer disease: Secondary | ICD-10-CM

## 2018-02-02 DIAGNOSIS — R41843 Psychomotor deficit: Secondary | ICD-10-CM | POA: Diagnosis present

## 2018-02-02 DIAGNOSIS — G8929 Other chronic pain: Secondary | ICD-10-CM | POA: Diagnosis present

## 2018-02-02 DIAGNOSIS — Q600 Renal agenesis, unilateral: Secondary | ICD-10-CM | POA: Diagnosis not present

## 2018-02-02 DIAGNOSIS — F13239 Sedative, hypnotic or anxiolytic dependence with withdrawal, unspecified: Secondary | ICD-10-CM | POA: Diagnosis not present

## 2018-02-02 DIAGNOSIS — R569 Unspecified convulsions: Secondary | ICD-10-CM | POA: Diagnosis not present

## 2018-02-02 DIAGNOSIS — F322 Major depressive disorder, single episode, severe without psychotic features: Secondary | ICD-10-CM | POA: Diagnosis not present

## 2018-02-02 DIAGNOSIS — F419 Anxiety disorder, unspecified: Secondary | ICD-10-CM | POA: Diagnosis present

## 2018-02-02 DIAGNOSIS — M549 Dorsalgia, unspecified: Secondary | ICD-10-CM | POA: Diagnosis present

## 2018-02-02 DIAGNOSIS — F332 Major depressive disorder, recurrent severe without psychotic features: Principal | ICD-10-CM | POA: Diagnosis present

## 2018-02-02 DIAGNOSIS — F17210 Nicotine dependence, cigarettes, uncomplicated: Secondary | ICD-10-CM | POA: Diagnosis present

## 2018-02-02 DIAGNOSIS — K219 Gastro-esophageal reflux disease without esophagitis: Secondary | ICD-10-CM | POA: Diagnosis present

## 2018-02-02 DIAGNOSIS — K589 Irritable bowel syndrome without diarrhea: Secondary | ICD-10-CM | POA: Diagnosis present

## 2018-02-02 DIAGNOSIS — G9341 Metabolic encephalopathy: Secondary | ICD-10-CM | POA: Diagnosis not present

## 2018-02-02 DIAGNOSIS — R579 Shock, unspecified: Secondary | ICD-10-CM

## 2018-02-02 HISTORY — DX: Essential (primary) hypertension: I10

## 2018-02-02 LAB — BASIC METABOLIC PANEL
Anion gap: 15 (ref 5–15)
BUN: 22 mg/dL — ABNORMAL HIGH (ref 6–20)
CO2: 21 mmol/L — ABNORMAL LOW (ref 22–32)
Calcium: 9.3 mg/dL (ref 8.9–10.3)
Chloride: 102 mmol/L (ref 98–111)
Creatinine, Ser: 1.36 mg/dL — ABNORMAL HIGH (ref 0.61–1.24)
GFR calc Af Amer: >60 mL/min (ref >60)
GFR calc non Af Amer: >60 mL/min (ref >60)
GLUCOSE: 110 mg/dL — AB (ref 70–99)
Potassium: 3.8 mmol/L (ref 3.5–5.1)
Sodium: 138 mmol/L (ref 135–145)

## 2018-02-02 LAB — CBC
HCT: 50.8 % (ref 39.0–52.0)
Hemoglobin: 16.1 g/dL (ref 13.0–17.0)
MCH: 29 pg (ref 26.0–34.0)
MCHC: 31.7 g/dL (ref 30.0–36.0)
MCV: 91.4 fL (ref 80.0–100.0)
Platelets: 360 10*3/uL (ref 150–400)
RBC: 5.56 MIL/uL (ref 4.22–5.81)
RDW: 14.4 % (ref 11.5–15.5)
WBC: 13.2 10*3/uL — ABNORMAL HIGH (ref 4.0–10.5)
nRBC: 0 % (ref 0.0–0.2)

## 2018-02-02 LAB — GLUCOSE, CAPILLARY
Glucose-Capillary: 104 mg/dL — ABNORMAL HIGH (ref 70–99)
Glucose-Capillary: 105 mg/dL — ABNORMAL HIGH (ref 70–99)
Glucose-Capillary: 110 mg/dL — ABNORMAL HIGH (ref 70–99)
Glucose-Capillary: 112 mg/dL — ABNORMAL HIGH (ref 70–99)
Glucose-Capillary: 137 mg/dL — ABNORMAL HIGH (ref 70–99)

## 2018-02-02 MED ORDER — AMLODIPINE BESYLATE 10 MG PO TABS
10.0000 mg | ORAL_TABLET | Freq: Every day | ORAL | Status: DC
  Administered 2018-02-02: 10 mg via ORAL
  Filled 2018-02-02: qty 1

## 2018-02-02 MED ORDER — ENSURE ENLIVE PO LIQD
237.0000 mL | Freq: Two times a day (BID) | ORAL | Status: DC
  Administered 2018-02-03: 237 mL via ORAL

## 2018-02-02 MED ORDER — CITALOPRAM HYDROBROMIDE 20 MG PO TABS
20.0000 mg | ORAL_TABLET | Freq: Every day | ORAL | Status: DC
  Administered 2018-02-03: 20 mg via ORAL
  Filled 2018-02-02 (×2): qty 1

## 2018-02-02 MED ORDER — FOLIC ACID 1 MG PO TABS: 1.0000 mg | ORAL_TABLET | Freq: Every day | ORAL | Status: DC

## 2018-02-02 MED ORDER — VITAMIN B-1 100 MG PO TABS
100.0000 mg | ORAL_TABLET | Freq: Every day | ORAL | Status: DC
  Administered 2018-02-03 – 2018-02-07 (×5): 100 mg via ORAL
  Filled 2018-02-02 (×8): qty 1

## 2018-02-02 MED ORDER — AMLODIPINE BESYLATE 10 MG PO TABS: 10.0000 mg | ORAL_TABLET | Freq: Every day | ORAL | Status: DC

## 2018-02-02 MED ORDER — HYDRALAZINE HCL 20 MG/ML IJ SOLN
10.0000 mg | INTRAMUSCULAR | Status: DC | PRN
  Administered 2018-02-02: 10 mg via INTRAVENOUS
  Filled 2018-02-02: qty 1

## 2018-02-02 MED ORDER — THIAMINE HCL 100 MG PO TABS: 100.0000 mg | ORAL_TABLET | Freq: Every day | ORAL | Status: DC

## 2018-02-02 MED ORDER — CLONAZEPAM 0.5 MG PO TABS: 0.5000 mg | ORAL_TABLET | Freq: Two times a day (BID) | ORAL | Status: DC

## 2018-02-02 MED ORDER — AMLODIPINE BESYLATE 10 MG PO TABS
10.0000 mg | ORAL_TABLET | Freq: Every day | ORAL | Status: DC
  Administered 2018-02-03 – 2018-02-07 (×5): 10 mg via ORAL
  Filled 2018-02-02 (×7): qty 1

## 2018-02-02 MED ORDER — PANTOPRAZOLE SODIUM 40 MG PO TBEC
40.0000 mg | DELAYED_RELEASE_TABLET | Freq: Every day | ORAL | Status: DC
  Administered 2018-02-03 – 2018-02-07 (×5): 40 mg via ORAL
  Filled 2018-02-02 (×7): qty 1

## 2018-02-02 MED ORDER — SUCRALFATE 1 GM/10ML PO SUSP
1.0000 g | Freq: Three times a day (TID) | ORAL | Status: DC
  Administered 2018-02-03 – 2018-02-07 (×18): 1 g via ORAL
  Filled 2018-02-02 (×27): qty 10

## 2018-02-02 MED ORDER — HYDRALAZINE HCL 20 MG/ML IJ SOLN: 10.0000 mg | INTRAMUSCULAR | Status: DC | PRN

## 2018-02-02 MED ORDER — BUSPIRONE HCL 10 MG PO TABS
10.0000 mg | ORAL_TABLET | Freq: Three times a day (TID) | ORAL | Status: DC
  Administered 2018-02-03: 10 mg via ORAL
  Filled 2018-02-02 (×4): qty 1

## 2018-02-02 MED ORDER — TRAZODONE HCL 50 MG PO TABS
50.0000 mg | ORAL_TABLET | Freq: Every evening | ORAL | Status: DC | PRN
  Administered 2018-02-03 – 2018-02-06 (×7): 50 mg via ORAL
  Filled 2018-02-02 (×13): qty 1

## 2018-02-02 MED ORDER — METOPROLOL TARTRATE 12.5 MG HALF TABLET
12.5000 mg | ORAL_TABLET | Freq: Two times a day (BID) | ORAL | Status: DC
  Administered 2018-02-03: 12.5 mg via ORAL
  Filled 2018-02-02 (×4): qty 1

## 2018-02-02 MED ORDER — METOPROLOL TARTRATE 12.5 MG HALF TABLET: 12.5000 mg | ORAL_TABLET | Freq: Two times a day (BID) | ORAL | Status: DC

## 2018-02-02 MED ORDER — CLONAZEPAM 0.5 MG PO TABS
0.5000 mg | ORAL_TABLET | Freq: Two times a day (BID) | ORAL | Status: DC | PRN
  Administered 2018-02-03: 0.5 mg via ORAL
  Filled 2018-02-02: qty 1

## 2018-02-02 MED ORDER — ADULT MULTIVITAMIN W/MINERALS CH: 1.0000 | ORAL_TABLET | Freq: Every day | ORAL | Status: DC

## 2018-02-02 MED ORDER — HYDROXYZINE HCL 25 MG PO TABS
25.0000 mg | ORAL_TABLET | Freq: Four times a day (QID) | ORAL | Status: DC | PRN
  Administered 2018-02-03 – 2018-02-07 (×3): 25 mg via ORAL
  Filled 2018-02-02 (×3): qty 1

## 2018-02-02 MED ORDER — ADULT MULTIVITAMIN W/MINERALS CH
1.0000 | ORAL_TABLET | Freq: Every day | ORAL | Status: DC
  Administered 2018-02-03 – 2018-02-07 (×5): 1 via ORAL
  Filled 2018-02-02 (×7): qty 1

## 2018-02-02 MED ORDER — METOPROLOL TARTRATE 25 MG PO TABS: 12.5000 mg | ORAL_TABLET | Freq: Two times a day (BID) | ORAL | Status: DC

## 2018-02-02 MED ORDER — FOLIC ACID 1 MG PO TABS
1.0000 mg | ORAL_TABLET | Freq: Every day | ORAL | Status: DC
  Administered 2018-02-03 – 2018-02-07 (×5): 1 mg via ORAL
  Filled 2018-02-02 (×7): qty 1

## 2018-02-02 MED ORDER — ALBUTEROL SULFATE HFA 108 (90 BASE) MCG/ACT IN AERS: 1.0000 | INHALATION_SPRAY | Freq: Four times a day (QID) | RESPIRATORY_TRACT | Status: DC | PRN

## 2018-02-02 NOTE — Progress Notes (Addendum)
  Speech Language Pathology Treatment: Dysphagia  Patient Details Name: Benjamin [name] MRN: [id number] DOB: 1970-12-22 Today's Date: 02/02/2018 Time: 1027-2536 SLP Time Calculation (min) (ACUTE ONLY): 17 min  Assessment / Plan / Recommendation Clinical Impression  Pt states that he feels that he still has difficulty swallowing.  He feels it takes longer and has decreased appetite.  Pt denies coughing with PO intake; however he was observed to throat clear following serial straw sips of liquid, and exhibited one strong immediate cough with thin liquid.  Per MBS report, cough is protective and clears pen/asp. Recommend continuing to avoid straws.  RN and Air cabin crew report no difficulties with medications or limited po intake.  There is no new chest imaging since upgrade to regular texture diet.  Continue regular texture diet with thin liquid with aspiration precautions as noted below.   HPI HPI: 48 y/o male with polysubstance abuse admitted with acute encephalopathy and seizure, intubated in ER for airway protection. Intubated 01/17/18 to 01/22/17.  MBSS 1/13 with recommendations for regular diet with thin liquid      SLP Plan  Continue with current plan of care       Recommendations  Diet recommendations: Regular;Thin liquid Liquids provided via: Cup Medication Administration: Crushed with puree Supervision: Staff to assist with self feeding;Full supervision/cueing for compensatory strategies Compensations: Slow rate;Small sips/bites Postural Changes and/or Swallow Maneuvers: Seated upright 90 degrees                SLP Visit Diagnosis: Dysphagia, oropharyngeal phase (R13.12) Plan: Continue with current plan of care       [place], [place], [place] Office: [phone]; Pager (1/15): [phone] 02/02/2018, 12:12 PM

## 2018-02-02 NOTE — Progress Notes (Addendum)
CSW received call from [facility]. They are able to accept patient after 8pm and can provide a walker for patient. CSW paged MD to see when patient can discharge. [facility] will hold the bed until tomorrow morning.    Percell Locus Khamani Fairley LCSW [phone]

## 2018-02-02 NOTE — Discharge Summary (Signed)
Physician Discharge Summary  Benjamin [name] JFH:[id number] DOB: 01-25-1970 DOA: 01/17/2018  PCP: Antony Contras, MD  Admit date: 01/17/2018 Discharge date: 02/02/2018  Admitted From: Home Disposition:  [place]  Recommendations for Outpatient Follow-up:  1. Follow up with [facility] provider at earliest [place]: No Equipment/Devices: None  Discharge Condition: Stable CODE STATUS: Full Diet recommendation: Heart Healthy   Brief/Interim Summary: 48 year old male with history of chronic pain and polysubstance abuse presented on 01/17/2018 with altered mental status from outside hospital.  Patient was very altered and unarousable so was brought to [facility] where he was obtunded, hypotensive and had a seizure for which he was subsequently intubated and transferred to [facility].  He required intubation for a few days along with initial IV pressors and IV fluids.  He was subsequently extubated and transferred to [facility] service on 01/30/2018.  Patient was seen by psychiatry who recommended inpatient psychiatric hospitalization once medically stable.  Patient is medically stable for discharge.  he will be discharged to psychiatric inpatient facility once bed is available.  Discharge Diagnoses:  Principal Problem:   Suicide attempt [facility] Active Problems:   Acute respiratory insufficiency   Altered mental state   Seizure ([place])   Acute metabolic encephalopathy   Hypovolemic shock (HCC)   Septic shock (HCC)   AKI (acute kidney injury) ([place])   Acute respiratory failure with hypoxemia (HCC)   Acute sinusitis  Acute toxic encephalopathy caused by intentional baclofen overdose -Mental status is much improved.  Continue Engineer, materials.  CT head, MRI brain was negative for any abnormality. -Did have seizure activity on EEG.  Was initially started on Vimpat and Keppra along with CIWA protocol.  Vimpat has been discontinued.  Keppra was discontinued on 02/01/2018.  Attempted  suicide with underlying depression -Awaiting inpatient psychiatric hospitalization as per psychiatry recommendations.  Continue suicide precautions with sitter.  Continue buspirone and clonazepam.  Follow-up with inpatient psychiatrist upon discharge at earliest convenience.  Dysphagia  -Improved.  Tolerating  Diet.  Hypertension -Blood pressure was elevated today.  Started on amlodipine and metoprolol.  Monitor blood pressure.  Outpatient follow-up  Acute hypoxic respiratory failure along with shock/unclear if the patient had sepsis: Present on admission -Resolved.  Respiratory status stable.  On room air.  Hemodynamically stable.  Discharge Instructions  Discharge Instructions    Diet - low sodium heart healthy   Complete by:  As directed    Increase activity slowly   Complete by:  As directed      Allergies as of 02/02/2018   No Known Allergies     Medication List    STOP taking these medications   ABSORBINE PAIN RELIEVING EX   BUTRANS 5 MCG/HR Ptwk patch Generic drug:  buprenorphine   DULoxetine 30 MG capsule Commonly known as:  CYMBALTA   haloperidol 5 MG tablet Commonly known as:  HALDOL   traZODone 50 MG tablet Commonly known as:  DESYREL     TAKE these medications   albuterol 108 (90 Base) MCG/ACT inhaler Commonly known as:  PROVENTIL HFA;VENTOLIN HFA Inhale 3 puffs into the lungs every 4 (four) hours as needed for wheezing or shortness of breath.   amLODipine 10 MG tablet Commonly known as:  NORVASC Take 1 tablet (10 mg total) by mouth daily. Start taking on:  February 03, 2018   busPIRone 10 MG tablet Commonly known as:  BUSPAR Take 10 mg by mouth 3 (three) times daily.   CARAFATE 1 GM/10ML suspension  Generic drug:  sucralfate TAKE 10 MLS BY MOUTH FOUR TIMES DAILY. What changed:  See the new instructions.   citalopram 20 MG tablet Commonly known as:  CELEXA Take 20 mg by mouth daily.   clonazePAM 0.5 MG tablet Commonly known as:   KLONOPIN Take 1 tablet (0.5 mg total) by mouth 2 (two) times daily.   DEXILANT 60 MG capsule Generic drug:  dexlansoprazole TAKE ONE CAPSULE BY MOUTH ONCE DAILY. What changed:  how much to take   folic acid 1 MG tablet Commonly known as:  FOLVITE Take 1 tablet (1 mg total) by mouth daily. Start taking on:  February 03, 2018   metoprolol tartrate 25 MG tablet Commonly known as:  LOPRESSOR Take 0.5 tablets (12.5 mg total) by mouth 2 (two) times daily.   multivitamin with minerals Tabs tablet Take 1 tablet by mouth daily. Start taking on:  February 03, 2018   thiamine 100 MG tablet Take 1 tablet (100 mg total) by mouth daily. Start taking on:  February 03, 2018      Follow-up Information    Antony Contras, MD. Schedule an appointment as soon as possible for a visit in 1 week(s).   Specialty:  Family Medicine Contact information: [address] [place] [address] [phone]          No Known Allergies  Consultations: PCCM/neurology/psychiatry   Procedures/Studies: Ct Head Wo Contrast  Result Date: 01/17/2018 CLINICAL DATA:  Patient found unresponsive EXAM: CT HEAD WITHOUT CONTRAST TECHNIQUE: Contiguous axial images were obtained from the base of the skull through the vertex without intravenous contrast. COMPARISON:  November 17, 2016 FINDINGS: Brain: The ventricles are normal in size and configuration. There is no intracranial mass, hemorrhage, extra-axial fluid collection, or midline shift. Brain parenchyma appears unremarkable. No acute infarct is demonstrable. Vascular: There is no appreciable hyperdense vessel. There is no appreciable vascular calcification. Skull: The bony calvarium appears intact. Sinuses/Orbits: There is mucosal thickening in the sphenoid sinuses bilaterally. There is opacification in multiple ethmoid air cells. There is edema in each naris region. Patient is intubated. Other: Mastoid air cells are clear. IMPRESSION: Multifocal  paranasal sinus disease with patient intubated. Brain parenchyma appears unremarkable. No mass or hemorrhage. Electronically Signed   By: Lowella Grip III M.D.   On: 01/17/2018 14:33   Mr Brain Wo Contrast  Result Date: 01/20/2018 CLINICAL DATA:  48 y/o M; evaluate for anoxic brain damage. History of chronic pain, polysubstance abuse, altered mental status. Positive drug screening for opiates, cocaine, THC. EXAM: MRI HEAD WITHOUT CONTRAST TECHNIQUE: Multiplanar, multiecho pulse sequences of the brain and surrounding structures were obtained without intravenous contrast. COMPARISON:  01/17/2018 CT head. FINDINGS: Brain: No acute infarction, hemorrhage, hydrocephalus, extra-axial collection or mass lesion. No structural or signal abnormality of the brain identified. Mild diffuse brain parenchymal volume loss for age. Vascular: Normal flow voids. Skull and upper cervical spine: Normal marrow signal. Sinuses/Orbits: Sphenoid and left maxillary sinus mucosal thickening. Partial opacification of mastoid air cells. Sinus and mastoid disease is likely due to intubation. Debris within the nasopharynx. Orbits are unremarkable. Other: None. IMPRESSION: 1. No acute intracranial abnormality identified. No findings of hypoxic ischemic injury at this time. 2. Mild diffuse volume loss of the brain. Electronically Signed   By: Kristine Garbe M.D.   On: 01/20/2018 18:30   Dg Chest Port 1 View  Result Date: 01/23/2018 CLINICAL DATA:  Endotracheal tube removal EXAM: PORTABLE CHEST 1 VIEW COMPARISON:  01/20/2018 FINDINGS: Endotracheal tube  and nasogastric tube have been removed. Left lung remains clear. Moderate atelectasis/infiltrate at the right lung base appears similar. No worsening or new finding. IMPRESSION: Endotracheal tube and nasogastric tube removed. Persistent right base atelectasis/infiltrate. Electronically Signed   By: Nelson Chimes M.D.   On: 01/23/2018 06:38   Dg Chest Port 1 View  Result Date:  01/20/2018 CLINICAL DATA:  48 year old male with respiratory failure. Right lung base opacity. Found down, obtunded. Seizures, possible anoxic injury. EXAM: PORTABLE CHEST 1 VIEW COMPARISON:  01/19/2018 and earlier. FINDINGS: Portable AP semi upright view at 0438 hours. Stable endotracheal tube tip at the level the clavicles. Enteric tube courses to the abdomen, tip not included. Underlying elevated right hemidiaphragm but there is patchy airspace opacity superimposed at the right lung base, infrahilar. No pneumothorax or pulmonary edema. The left lung remains clear allowing for portable technique. No definite pleural effusion. Mediastinal contours remain normal. Stable visualized osseous structures. IMPRESSION: 1. Continued airspace opacity at the right lung base superimposed on elevated hemidiaphragm. Differential considerations include infection, aspiration, and atelectasis. 2. No other No acute cardiopulmonary abnormality. 3.  Stable lines and tubes. Electronically Signed   By: Genevie Ann M.D.   On: 01/20/2018 07:02   Dg Chest Port 1 View  Result Date: 01/19/2018 CLINICAL DATA:  Respiratory failure. EXAM: PORTABLE CHEST 1 VIEW COMPARISON:  01/18/2018 FINDINGS: Endotracheal tube terminates at the inferior margin of the clavicular heads, unchanged. Enteric tube terminates over the proximal stomach. The cardiac silhouette is normal in size. There is elevation of the right hemidiaphragm with increasing opacity in the right lung base. No large pleural effusion or pneumothorax is identified. An old left clavicle fracture is noted. IMPRESSION: Worsening right basilar airspace disease. Electronically Signed   By: Logan Bores M.D.   On: 01/19/2018 08:08   Dg Chest Port 1 View  Result Date: 01/18/2018 CLINICAL DATA:  Intubation. EXAM: PORTABLE CHEST 1 VIEW COMPARISON:  01/17/2018. FINDINGS: Endotracheal tube in stable position. NG tube noted with tip below left hemidiaphragm. Heart size stable. Bibasilar  atelectasis/infiltrates. No pleural effusion or pneumothorax. IMPRESSION: 1. Endotracheal tube in stable position. NG tube noted with tip below left hemidiaphragm. 2.  Bibasilar atelectasis/infiltrates.  New from prior exam. Electronically Signed   By: [place]   On: 01/18/2018 05:25   Dg Chest Port 1 View  Result Date: 01/17/2018 CLINICAL DATA:  Altered mental status. Status post intubation. EXAM: PORTABLE CHEST 1 VIEW COMPARISON:  Chest x-ray dated 02/07/2013 FINDINGS: Endotracheal tube tip is at the level of the thoracic inlet. Heart size and vascularity are normal. Lungs are clear. No acute bone abnormality. Old healed fracture of the left clavicle. IMPRESSION: Endotracheal tube in good position. Clear lungs. Electronically Signed   By: Lorriane Shire M.D.   On: 01/17/2018 11:59   Dg Abd Portable 1v  Result Date: 01/26/2018 CLINICAL DATA:  Feeding tube placement EXAM: PORTABLE ABDOMEN - 1 VIEW COMPARISON:  None. FINDINGS: There is a feeding tube with the tip projecting over the antrum of the stomach. There is no bowel dilatation to suggest obstruction. There is no evidence of pneumoperitoneum, portal venous gas or pneumatosis. There are no pathologic calcifications along the expected course of the ureters. The osseous structures are unremarkable. IMPRESSION: There is a feeding tube with the tip projecting over the antrum of the stomach. Electronically Signed   By: Kathreen Devoid   On: 01/26/2018 11:12   Dg Abd Portable 1v  Result Date: 01/18/2018 CLINICAL DATA:  High NG  output EXAM: PORTABLE ABDOMEN - 1 VIEW COMPARISON:  CT 08/26/2017 FINDINGS: Esophageal tube tip overlies the gastric fundus. Airspace disease at the right lung base. Nonobstructed bowel gas pattern with overall decreased bowel gas. Scattered colon gas is noted. IMPRESSION: 1. Esophageal tube tip overlies the gastric fundus. 2. Nonobstructed gas pattern 3. Airspace disease at the right lung base. Electronically Signed   By: Donavan Foil M.D.   On: 01/18/2018 19:56   Dg Abd Portable 2v  Result Date: 01/21/2018 CLINICAL DATA:  Cough. EXAM: PORTABLE ABDOMEN - 2 VIEW COMPARISON:  01/18/2018. FINDINGS: NG tube noted coiled stomach. Soft tissue structures are unremarkable. No bowel distention. No free air. Lumbar spine scoliosis concave right. IMPRESSION: 1.  NG tube noted coiled stomach. 2.  No acute intra-abdominal abnormality identified. Electronically Signed   By: Marcello Moores  Register   On: 01/21/2018 15:54   Dg Fluoro Guide Lumbar Puncture  Result Date: 01/18/2018 CLINICAL DATA:  48 year old male found of tongue did. Seizure. Possible sepsis, meningitis. Intubated. Unsuccessful bedside lumbar puncture. EXAM: DIAGNOSTIC LUMBAR PUNCTURE UNDER FLUOROSCOPIC GUIDANCE FLUOROSCOPY TIME:  Fluoroscopy Time:  0 minutes 6 seconds Radiation Exposure Index (if provided by the fluoroscopic device): Number of Acquired Spot Images: 0 PROCEDURE: Informed consent was obtained from the patient's mother via telephone prior to the procedure, including potential complications of headache, allergy, and pain. A "time-out" was performed. With the patient intubated and prone, the lower back was prepped with Betadine. 1% Lidocaine was used for local anesthesia. Lumbar puncture was performed at the L2-L3 level using left sub laminar technique and a 3.5 in x 20 gauge needle which had to be hubbed. Return of clear, colorless CSF with an elevated opening pressure of 35-36 cm water. 18 mL of CSF were obtained for laboratory studies. The patient tolerated the procedure well and there were no apparent complications. The patient was returned to the ICU in stable condition for continued treatment. IMPRESSION: Lumbar puncture with fluoroscopic guidance at L2-L3. Elevated opening pressure (although measured while prone and intubated) estimated at 35-36 cm of water. 18 mL of clear, colorless CSF obtained for laboratory studies. Electronically Signed   By: Genevie Ann M.D.   On:  01/18/2018 11:29      Subjective:  Patient seen and examined at bedside.  He is awake but a poor historian.  No overnight fever or vomiting reported.  Discharge Exam: Vitals:   02/02/18 0702 02/02/18 1344  BP: (!) 151/100 (!) 135/93  Pulse: (!) 126 (!) 108  Resp:  20  Temp: 98.1 F (36.7 C) 98.5 F (36.9 C)  SpO2: 96% 97%    General exam: Appears calm and comfortable.  Looks older than stated age.  Poor historian. Respiratory system: Bilateral decreased breath sounds at bases Cardiovascular system: S1 & S2 heard, Rate controlled Gastrointestinal system: Abdomen is nondistended, soft and nontender. Normal bowel sounds heard. Extremities: No cyanosis, clubbing, edema    The results of significant diagnostics from this hospitalization (including imaging, microbiology, ancillary and laboratory) are listed below for reference.     Microbiology: No results found for this or any previous visit (from the past 240 hour(s)).   Labs: BNP (last 3 results) No results for input(s): BNP in the last 8760 hours. Basic Metabolic Panel: Recent Labs  Lab 01/27/18 0600 01/28/18 0536 01/29/18 0303 01/31/18 0422 02/02/18 0350  NA 139 138 142 138 138  K 3.5 3.2* 3.4* 4.0 3.8  CL 106 101 109 102 102  CO2 25 26 23  23  21*  GLUCOSE 115* 106* 90 122* 110*  BUN <5* 6 11 9  22*  CREATININE 0.77 0.87 0.91 1.05 1.36*  CALCIUM 8.6* 8.6* 7.8* 9.6 9.3  MG 1.9 2.0 1.7 2.3  --   PHOS 2.9 3.4 3.6 3.9  --    Liver Function Tests: Recent Labs  Lab 01/31/18 0422  AST 23  ALT 44  ALKPHOS 60  BILITOT 0.9  PROT 7.8  ALBUMIN 4.0   No results for input(s): LIPASE, AMYLASE in the last 168 hours. No results for input(s): AMMONIA in the last 168 hours. CBC: Recent Labs  Lab 01/31/18 0422 02/02/18 0350  WBC 12.8* 13.2*  HGB 15.9 16.1  HCT 50.1 50.8  MCV 89.5 91.4  PLT 369 360   Cardiac Enzymes: No results for input(s): CKTOTAL, CKMB, CKMBINDEX, TROPONINI in the last 168  hours. BNP: Invalid input(s): POCBNP CBG: Recent Labs  Lab 02/01/18 2120 02/02/18 0020 02/02/18 0417 02/02/18 0801 02/02/18 1133  GLUCAP 98 112* 110* 104* 137*   D-Dimer No results for input(s): DDIMER in the last 72 hours. Hgb A1c No results for input(s): HGBA1C in the last 72 hours. Lipid Profile No results for input(s): CHOL, HDL, LDLCALC, TRIG, CHOLHDL, LDLDIRECT in the last 72 hours. Thyroid function studies No results for input(s): TSH, T4TOTAL, T3FREE, THYROIDAB in the last 72 hours.  Invalid input(s): FREET3 Anemia work up No results for input(s): VITAMINB12, FOLATE, FERRITIN, TIBC, IRON, RETICCTPCT in the last 72 hours. Urinalysis    Component Value Date/Time   COLORURINE YELLOW 01/17/2018 1128   APPEARANCEUR CLEAR 01/17/2018 1128   LABSPEC 1.004 (L) 01/17/2018 1128   PHURINE 6.0 01/17/2018 1128   GLUCOSEU NEGATIVE 01/17/2018 1128   HGBUR SMALL (A) 01/17/2018 1128   BILIRUBINUR NEGATIVE 01/17/2018 1128   KETONESUR NEGATIVE 01/17/2018 1128   PROTEINUR NEGATIVE 01/17/2018 1128   UROBILINOGEN 0.2 01/21/2013 1834   NITRITE NEGATIVE 01/17/2018 1128   LEUKOCYTESUR NEGATIVE 01/17/2018 1128   Sepsis Labs Invalid input(s): PROCALCITONIN,  WBC,  LACTICIDVEN Microbiology No results found for this or any previous visit (from the past 240 hour(s)).   Time coordinating discharge: 35 minutes  SIGNED:   Aline August, MD  [facility] 02/02/2018, 4:49 PM Pager: [phone]  If 7PM-7AM, please contact night-coverage [email or web address] Password [email or web address]

## 2018-02-02 NOTE — Progress Notes (Signed)
Physical Therapy Treatment Patient Details Name: Benjamin [name] MRN: [id number] DOB: 1970-01-29 Today's Date: 02/02/2018    History of Present Illness 48 y/o male admitted with encephalopathy with Sz. Intubated 12/30-1/4. PMhx: polysubstance abuse, depression, GERD, solitary kidney, bad back with pain    PT Comments    Patient improving with mobility and balance, though still somewhat unsteady and today session limited due to frequent stools and abdominal discomfort.  Feel he should be able to discharge to inpatient psych rather than SNF and progress mobility as able with nursing assist as well.  PT to follow.    Follow Up Recommendations  Other (comment)(inpatient psychiatric admission)     Equipment Recommendations  Cane    Recommendations for Other Services       Precautions / Restrictions Precautions Precautions: Fall Precaution Comments: sitter    Mobility  Bed Mobility Overal bed mobility: Needs Assistance Bed Mobility: Sidelying to Sit   Sidelying to sit: Supervision Supine to sit: Supervision     General bed mobility comments: increased time, but no physical help needed  Transfers Overall transfer level: Needs assistance Equipment used: None Transfers: Sit to/from Stand Sit to Stand: Supervision;Min guard         General transfer comment: increased time, but no physical assist at times, from bed minguard for balance given  Ambulation/Gait Ambulation/Gait assistance: Min assist Gait Distance (Feet): 20 Feet(x 2) Assistive device: None Gait Pattern/deviations: Step-through pattern;Decreased stride length;Wide base of support     General Gait Details: slow pace/almost bradykinesthetic, one LOB to R going back to bed from bathroom with min A for recovery    Stairs             Wheelchair Mobility    Modified Rankin (Stroke Patients Only)       Balance Overall balance assessment: Needs assistance Sitting-balance support: Feet  supported Sitting balance-Leahy Scale: Good Sitting balance - Comments: standing at sink to wash hands, standing in bathroom with S after toileting     Standing balance-Leahy Scale: Fair                              Cognition Arousal/Alertness: Awake/alert Behavior During Therapy: Flat affect Overall Cognitive Status: Impaired/Different from baseline Area of Impairment: Safety/judgement;Awareness;Problem solving                         Safety/Judgement: Decreased awareness of safety;Decreased awareness of deficits Awareness: Intellectual Problem Solving: Slow processing;Requires verbal cues;Requires tactile cues        Exercises      General Comments General comments (skin integrity, edema, etc.): feeling ill due to frequent stolls today; reports only taking Ensure today, encouraged solid food for lunch, sitter in room and aware      Pertinent Vitals/Pain Pain Assessment: Faces Faces Pain Scale: Hurts little more Pain Location: abdomen Pain Descriptors / Indicators: Discomfort;Cramping Pain Intervention(s): Limited activity within patient's tolerance;Repositioned    Home Living                      Prior Function            PT Goals (current goals can now be found in the care plan section) Progress towards PT goals: Progressing toward goals    Frequency    Min 3X/week      PT Plan Discharge plan needs to be updated;Frequency needs to be updated;Equipment recommendations need  to be updated    Co-evaluation              AM-PAC PT "6 Clicks" Mobility   Outcome Measure  Help needed turning from your back to your side while in a flat bed without using bedrails?: A Little Help needed moving from lying on your back to sitting on the side of a flat bed without using bedrails?: A Little Help needed moving to and from a bed to a chair (including a wheelchair)?: A Little Help needed standing up from a chair using your arms (e.g.,  wheelchair or bedside chair)?: A Little Help needed to walk in hospital room?: A Little Help needed climbing 3-5 steps with a railing? : A Little 6 Click Score: 18    End of Session   Activity Tolerance: Treatment limited secondary to medical complications (Comment)(frequent stools) Patient left: in bed;with call bell/phone within reach;with nursing/sitter in room   PT Visit Diagnosis: Muscle weakness (generalized) (M62.81);Other abnormalities of gait and mobility (R26.89)     Time: 1100-1115 PT Time Calculation (min) (ACUTE ONLY): 15 min  Charges:  $Gait Training: 8-22 mins                     Magda Kiel, [place] [phone] 02/02/2018    Reginia Naas 02/02/2018, 11:57 AM

## 2018-02-02 NOTE — Progress Notes (Signed)
[facility] still reviewing referral. CSW expanded search.   Percell Locus Makaylee Spielberg LCSW [phone]

## 2018-02-02 NOTE — Progress Notes (Signed)
Occupational Therapy Treatment Patient Details Name: Benjamin [name] MRN: [id number] DOB: July 20, 1970 Today's Date: 02/02/2018    History of present illness 48 y/o male admitted with encephalopathy with Sz. Intubated 12/30-1/4. PMhx: polysubstance abuse, depression, GERD, solitary kidney, bad back with pain   OT comments  Pt progressing towards acute OT goals. Remains with flat affect and decreased initiation though was agreeable to walk across room, stand at the window a minute, then sat up in recliner. Pt was close min guard to navigate obstacles/turns in the room, no AD utilized. D/c plan updated to inpatient psych.   Follow Up Recommendations  Other (comment)(Inpatient psychiatric admission)    Equipment Recommendations  None recommended by OT    Recommendations for Other Services      Precautions / Restrictions Precautions Precautions: Fall Precaution Comments: sitter Restrictions Weight Bearing Restrictions: No       Mobility Bed Mobility Overal bed mobility: Needs Assistance Bed Mobility: Sidelying to Sit   Sidelying to sit: Supervision Supine to sit: Supervision     General bed mobility comments: OOB upon arrival of OT  Transfers Overall transfer level: Needs assistance Equipment used: None Transfers: Sit to/from Stand Sit to Stand: Supervision;Min guard         General transfer comment: increased time, but no physical assist at times, from bed minguard for balance given    Balance Overall balance assessment: Needs assistance Sitting-balance support: Feet supported Sitting balance-Leahy Scale: Good Sitting balance - Comments: standing at sink to wash hands, standing in bathroom with S after toileting   Standing balance support: No upper extremity supported Standing balance-Leahy Scale: Fair Standing balance comment: fair static, occasional reaching for single extremity support during dynamic tasks                           ADL either  performed or assessed with clinical judgement   ADL Overall ADL's : Needs assistance/impaired   Eating/Feeding Details (indicate cue type and reason): pt reports decreased appetite. states he was able to drink some Ensure earlier. Discussed strategies for helping to build appetite                                 Functional mobility during ADLs: Min guard General ADL Comments: Pt standing up in room upon OT arrival. Agreeable to walk in the room a bit. Up in recliner with lunch tray in front at end of session.      Vision       Perception     Praxis      Cognition Arousal/Alertness: Awake/alert Behavior During Therapy: Flat affect Overall Cognitive Status: Impaired/Different from baseline Area of Impairment: Safety/judgement;Awareness;Problem solving                   Current Attention Level: Sustained Memory: Decreased short-term memory Following Commands: Follows one step commands consistently Safety/Judgement: Decreased awareness of safety;Decreased awareness of deficits Awareness: Intellectual Problem Solving: Slow processing;Requires verbal cues;Requires tactile cues          Exercises     Shoulder Instructions       General Comments discussed having lid off of lunch tray to stimulate appetite and positioned tray in front of pt at end of session    Pertinent Vitals/ Pain       Pain Assessment: Faces Faces Pain Scale: Hurts even more Pain Location: low back, L shoulder Pain  Descriptors / Indicators: Aching;Discomfort Pain Intervention(s): Limited activity within patient's tolerance;Monitored during session;Repositioned  Home Living                                          Prior Functioning/Environment              Frequency  Min 2X/week        Progress Toward Goals  OT Goals(current goals can now be found in the care plan section)  Progress towards OT goals: Progressing toward goals  Acute Rehab OT  Goals Patient Stated Goal: unable to state today OT Goal Formulation: With family Time For Goal Achievement: 02/07/18 Potential to Achieve Goals: Fair ADL Goals Pt Will Perform Grooming: with min assist;sitting(2 tasks; EOB or recliner) Pt Will Transfer to Toilet: with min assist;stand pivot transfer;bedside commode Additional ADL Goal #1: Pt will follow 1 step commands 50% of time  Plan Discharge plan needs to be updated    Co-evaluation                 AM-PAC OT "6 Clicks" Daily Activity     Outcome Measure   Help from another person eating meals?: A Little Help from another person taking care of personal grooming?: A Lot Help from another person toileting, which includes using toliet, bedpan, or urinal?: A Lot Help from another person bathing (including washing, rinsing, drying)?: A Lot Help from another person to put on and taking off regular upper body clothing?: A Lot Help from another person to put on and taking off regular lower body clothing?: A Lot 6 Click Score: 13    End of Session    OT Visit Diagnosis: Unsteadiness on feet (R26.81);Cognitive communication deficit (R41.841)   Activity Tolerance Patient tolerated treatment well   Patient Left in chair;with call bell/phone within reach;with nursing/sitter in room   Nurse Communication          Time: 2683-4196 OT Time Calculation (min): 10 min  Charges: OT General Charges $OT Visit: 1 Visit OT Treatments $Self Care/Home Management : 8-22 mins  Tyrone Schimke, OT Acute Rehabilitation Services Pager: [phone] Office: [phone]    Hortencia Pilar 02/02/2018, 1:44 PM

## 2018-02-02 NOTE — Progress Notes (Signed)
Patient ID: Benjamin [name], male   DOB: Dec 26, 1970, 48 y.o.   MRN: [id number]  PROGRESS NOTE    KRRISH FREUND  JAS:[id number] DOB: [birth year] DOA: 01/17/2018 PCP: Antony Contras, MD   Brief Narrative:  48 year old male with history of chronic pain and polysubstance abuse presented on 01/17/2018 with altered mental status from outside hospital.  Patient was very altered and unarousable so was brought to [facility] where he was obtunded, hypotensive and had a seizure for which he was subsequently intubated and transferred to [facility].  He required intubation for a few days along with initial IV pressors and IV fluids.  He was subsequently extubated and transferred to [facility] service on 01/30/2018.   Assessment & Plan:   Principal Problem:   Suicide attempt [facility]) Active Problems:   Acute respiratory insufficiency   Altered mental state   Seizure ([place])   Acute metabolic encephalopathy   Hypovolemic shock (HCC)   Septic shock (HCC)   AKI (acute kidney injury) ([place])   Acute respiratory failure with hypoxemia (HCC)   Acute sinusitis   Acute toxic encephalopathy caused by intentional baclofen overdose -Mental status is much improved.  Continue Engineer, materials.  CT head, MRI brain was negative for any abnormality. -Did have seizure activity on EEG.  Was initially started on Vimpat and Keppra along with CIWA protocol.  Vimpat has been discontinued.  Keppra was discontinued on 02/01/2018.  Attempted suicide with underlying depression -Awaiting inpatient psychiatric hospitalization as per psychiatry recommendations.  Continue suicide precautions with sitter.  Continue buspirone and clonazepam  Dysphagia  -Improved.  Tolerating regular diet  Hypertension -Blood pressure is extremely elevated today.  Will start amlodipine and metoprolol.  Monitor blood pressure.  Acute hypoxic respiratory failure along with shock/unclear if the patient had sepsis: Present on  admission -Resolved.  Respiratory status stable.  On room air.  Hemodynamically stable.   DVT prophylaxis: Heparin Code Status: Full Family Communication: None at bedside Disposition Plan: Inpatient psychiatric facility once bed is available  Consultants: PCCM/neurology/psychiatry  Procedures:  Intubated 01/17/2018, extubated 01/22/2018 01/18/2018: Lumbar puncture with no pleocytosis  Antimicrobials: None   Subjective: Patient seen and examined at bedside.  He is awake but a poor historian.  No overnight fever or vomiting reported.  Objective: Vitals:   02/01/18 1952 02/02/18 0414 02/02/18 0503 02/02/18 0702  BP: (!) 152/95  (!) 143/111 (!) 151/100  Pulse: 98  (!) 102 (!) 126  Resp: 18  18   Temp: 98.1 F (36.7 C)  98.1 F (36.7 C) 98.1 F (36.7 C)  TempSrc: Oral  Oral Oral  SpO2: 96%  94% 96%  Weight:  76.7 kg    Height:        Intake/Output Summary (Last 24 hours) at 02/02/2018 1134 Last data filed at 02/02/2018 1000 Gross per 24 hour  Intake 360 ml  Output -  Net 360 ml   Filed Weights   01/29/18 0500 01/31/18 0525 02/02/18 0414  Weight: 86.4 kg 77.3 kg 76.7 kg    Examination:  General exam: Appears calm and comfortable.  Looks older than stated age.  Poor historian. Respiratory system: Bilateral decreased breath sounds at bases Cardiovascular system: S1 & S2 heard, Rate controlled Gastrointestinal system: Abdomen is nondistended, soft and nontender. Normal bowel sounds heard. Extremities: No cyanosis, clubbing, edema    Data Reviewed: I have personally reviewed following labs and imaging studies  CBC: Recent Labs  Lab 01/31/18 0422 02/02/18 0350  WBC 12.8* 13.2*  HGB 15.9 16.1  HCT 50.1 50.8  MCV 89.5 91.4  PLT 369 329   Basic Metabolic Panel: Recent Labs  Lab 01/27/18 0600 01/28/18 0536 01/29/18 0303 01/31/18 0422 02/02/18 0350  NA 139 138 142 138 138  K 3.5 3.2* 3.4* 4.0 3.8  CL 106 101 109 102 102  CO2 25 26 23 23  21*  GLUCOSE 115*  106* 90 122* 110*  BUN <5* 6 11 9  22*  CREATININE 0.77 0.87 0.91 1.05 1.36*  CALCIUM 8.6* 8.6* 7.8* 9.6 9.3  MG 1.9 2.0 1.7 2.3  --   PHOS 2.9 3.4 3.6 3.9  --    GFR: Estimated Creatinine Clearance: 65 mL/min (A) (by C-G formula based on SCr of 1.36 mg/dL (H)). Liver Function Tests: Recent Labs  Lab 01/31/18 0422  AST 23  ALT 44  ALKPHOS 60  BILITOT 0.9  PROT 7.8  ALBUMIN 4.0   No results for input(s): LIPASE, AMYLASE in the last 168 hours. No results for input(s): AMMONIA in the last 168 hours. Coagulation Profile: No results for input(s): INR, PROTIME in the last 168 hours. Cardiac Enzymes: No results for input(s): CKTOTAL, CKMB, CKMBINDEX, TROPONINI in the last 168 hours. BNP (last 3 results) No results for input(s): PROBNP in the last 8760 hours. HbA1C: No results for input(s): HGBA1C in the last 72 hours. CBG: Recent Labs  Lab 02/01/18 1652 02/01/18 2120 02/02/18 0020 02/02/18 0417 02/02/18 0801  GLUCAP 126* 98 112* 110* 104*   Lipid Profile: No results for input(s): CHOL, HDL, LDLCALC, TRIG, CHOLHDL, LDLDIRECT in the last 72 hours. Thyroid Function Tests: No results for input(s): TSH, T4TOTAL, FREET4, T3FREE, THYROIDAB in the last 72 hours. Anemia Panel: No results for input(s): VITAMINB12, FOLATE, FERRITIN, TIBC, IRON, RETICCTPCT in the last 72 hours. Sepsis Labs: No results for input(s): PROCALCITON, LATICACIDVEN in the last 168 hours.  No results found for this or any previous visit (from the past 240 hour(s)).       Radiology Studies: No results found.      Scheduled Meds: . amLODipine  10 mg Oral Daily  . busPIRone  10 mg Per Tube TID  . citalopram  20 mg Oral Daily  . clonazePAM  0.5 mg Oral BID  . feeding supplement (ENSURE ENLIVE)  237 mL Oral BID BM  . folic acid  1 mg Per Tube Daily  . heparin injection (subcutaneous)  5,000 Units Subcutaneous Q8H  . lidocaine  1 patch Transdermal Q24H  . mouth rinse  15 mL Mouth Rinse BID  .  metoprolol tartrate  12.5 mg Oral BID  . multivitamin with minerals  1 tablet Oral Daily  . nicotine  14 mg Transdermal QHS  . senna-docusate  1 tablet Per Tube BID  . thiamine  100 mg Per Tube Daily   Continuous Infusions:   LOS: 16 days        Aline August, MD [facility] Pager [phone]  If 7PM-7AM, please contact night-coverage [email or web address] Password [facility] 02/02/2018, 11:34 AM

## 2018-02-02 NOTE — Tx Team (Signed)
Initial Treatment Plan 02/02/2018 11:00 PM Benjamin [name] VKP:[id number]    PATIENT STRESSORS: Financial difficulties Substance abuse   PATIENT STRENGTHS: Ability for insight Capable of independent living Communication skills Supportive family/friends   PATIENT IDENTIFIED PROBLEMS:   " Feeling depressed"   "Took medication to overdose"   "I've been using cocaine every other day"               DISCHARGE CRITERIA:  Ability to meet basic life and health needs Adequate post-discharge living arrangements Improved stabilization in mood, thinking, and/or behavior Reduction of life-threatening or endangering symptoms to within safe limits  PRELIMINARY DISCHARGE PLAN: Outpatient therapy Return to previous living arrangement  PATIENT/FAMILY INVOLVEMENT: This treatment plan has been presented to and reviewed with the patient, Benjamin [name], and/or family member, .  The patient and family have been given the opportunity to ask questions and make suggestions.  Franciso Bend, RN 02/02/2018, 11:00 PM

## 2018-02-02 NOTE — Progress Notes (Addendum)
Patient will DC to: [facility] Anticipated DC date: 02/02/2018 Family notified: Mother Transport by: [place]   Room 305 Bed 1, Dr. Mallie Darting.   Per MD patient ready for DC to [facility]. RN, patient, patient's family, and facility notified of DC. Voluntary form sent to facility. RN to call report prior to discharge [phone]).  [place] transport requested for patient.   CSW will sign off for now as social work intervention is no longer needed. Please consult [place] again if new needs arise.  Cedric Fishman, LCSW Clinical Social Worker [phone]

## 2018-02-02 NOTE — Progress Notes (Signed)
Attempted to call report to [facility]. Was told they are not allowed to take report for a patient that was not supposed to arrive until 2000. Was instructed to call back around 1945.

## 2018-02-03 DIAGNOSIS — F322 Major depressive disorder, single episode, severe without psychotic features: Secondary | ICD-10-CM

## 2018-02-03 DIAGNOSIS — G4700 Insomnia, unspecified: Secondary | ICD-10-CM

## 2018-02-03 MED ORDER — METOPROLOL TARTRATE 25 MG PO TABS
25.0000 mg | ORAL_TABLET | Freq: Two times a day (BID) | ORAL | Status: DC
  Administered 2018-02-03 – 2018-02-07 (×8): 25 mg via ORAL
  Filled 2018-02-03 (×13): qty 1

## 2018-02-03 MED ORDER — TEMAZEPAM 15 MG PO CAPS
30.0000 mg | ORAL_CAPSULE | Freq: Every day | ORAL | Status: DC
  Administered 2018-02-03 – 2018-02-06 (×4): 30 mg via ORAL
  Filled 2018-02-03 (×4): qty 2

## 2018-02-03 MED ORDER — CARBAMAZEPINE 100 MG PO CHEW
100.0000 mg | CHEWABLE_TABLET | Freq: Three times a day (TID) | ORAL | Status: DC
  Administered 2018-02-03 – 2018-02-04 (×3): 100 mg via ORAL
  Filled 2018-02-03 (×7): qty 1

## 2018-02-03 MED ORDER — CITALOPRAM HYDROBROMIDE 10 MG PO TABS
10.0000 mg | ORAL_TABLET | Freq: Every day | ORAL | Status: DC
  Administered 2018-02-04: 10 mg via ORAL
  Filled 2018-02-03 (×2): qty 1

## 2018-02-03 MED ORDER — CLONAZEPAM 0.5 MG PO TABS
0.5000 mg | ORAL_TABLET | Freq: Two times a day (BID) | ORAL | Status: DC
  Administered 2018-02-03 – 2018-02-04 (×2): 0.5 mg via ORAL
  Filled 2018-02-03 (×2): qty 1
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
## 2018-02-03 NOTE — Progress Notes (Signed)
Psychoeducational Group Note  Date:  02/03/2018 Time:  2211  Group Topic/Focus:  Wrap-Up Group:   The focus of this group is to help patients review their daily goal of treatment and discuss progress on daily workbooks.  Participation Level: Did Not Attend  Participation Quality:  Not Applicable  Affect:  Not Applicable  Cognitive:  Not Applicable  Insight:  Not Applicable  Engagement in Group: Not Applicable  Additional Comments:  The patient didn't attend group this evening since he was asleep in his bed.   Archie Balboa S 02/03/2018, 10:11 PM

## 2018-02-03 NOTE — Progress Notes (Signed)
Patient ID: [name], male   DOB: [birth year], 48 y.o.   MRN: [id number]   Benjamin [name] is a 48 year old voluntary admission from a [facility] medical floor.He was first taken to [facility] with altered mental status after an overdose of baclofen. He was obtunded, hypotensive, and had a seizure there and was intubated. He was transferred to [facility] and was in ICU and was extubated on 1/12. He was cleared to transfer to [facility] today. Hx of polysubstance abuse and chronic back pain with surgeries in the past. He admits to depression and the overdose but denies any suicidal thoughts at present. He worked with PT and OT and is currently walking well with a walker. Fall precautions due to weakness at this time. He never would give any specific reasons for suicide attempt except that he has not had a vehicle in a long time. Currently lives with parents. He denies alcohol and other substance abuse except cocaine. He reports that he does not abuse pain pills but takes hydrocodone for pain. He has a hx of back surgeries (chronic pain), HTN, active sinusitis. Cooperative with admission process.

## 2018-02-03 NOTE — Progress Notes (Signed)
Patient ID: Benjamin [name], male   DOB: [birth year], 48 y.o.   MRN: [id number]  Nursing Progress Note [id number]  Data: On initial approach, patient is resting in bed. Per night shift report, patient had disrobbed throughout the night and was observed masturbating several times by MHT. Patient restricted from roommates at this time. This morning, patient presents with a flat, bizarre affect. Patient appears minimal/guarded during interactions and does not speak much. Patient is compliant with scheduled medications and denies concerns. Patient is observed by MHT to be masturbating at times in his room throughout the day. Patient is isolative to his room. Patient currently denies SI/HI/AVH.   Action: Patient is educated about and provided medication per provider's orders. Patient safety maintained with q15 min safety checks and frequent rounding. Low fall risk precautions in place. Emotional support given. 1:1 interaction and active listening provided. Patient encouraged to attend meals, groups, and work on treatment plan and goals. Labs, vital signs and patient behavior monitored throughout shift.   Response: Patient remains safe on the unit at this time and agrees to come to staff with any issues/concerns. Will continue to support and monitor.

## 2018-02-03 NOTE — BHH Suicide Risk Assessment (Signed)
[facility] Admission Suicide Risk Assessment   Nursing information obtained from:  Patient Demographic factors:  Male, Caucasian Current Mental Status:  Self-harm thoughts Loss Factors:  NA Historical Factors:  NA Risk Reduction Factors:  Living with another person, especially a relative, Positive social support  Total Time spent with patient: 30 minutes Principal Problem: Overdose/denies current suicidal thinking plans or intent but drug-seeking Diagnosis:  Active Problems:   MDD (major depressive disorder), severe ([facility])  Subjective Data: Substance abuse status post overdose  Continued Clinical Symptoms:  Alcohol Use Disorder Identification Test Final Score (AUDIT): 0 The "Alcohol Use Disorders Identification Test", Guidelines for Use in Primary Care, Second Edition.  World [employer] [facility]). Score between 0-7:  no or low risk or alcohol related problems. Score between 8-15:  moderate risk of alcohol related problems. Score between 16-19:  high risk of alcohol related problems. Score 20 or above:  warrants further diagnostic evaluation for alcohol dependence and treatment.   CLINICAL FACTORS:   Alcohol/Substance Abuse/Dependencies   COGNITIVE FEATURES THAT CONTRIBUTE TO RISK:  Loss of executive function    SUICIDE RISK:   Mild:  Suicidal ideation of limited frequency, intensity, duration, and specificity.  There are no identifiable plans, no associated intent, mild dysphoria and related symptoms, good self-control (both objective and subjective assessment), few other risk factors, and identifiable protective factors, including available and accessible social support.  PLAN OF CARE: Multiple med adjustments continue current precautions and groups  I certify that inpatient services furnished can reasonably be expected to improve the patient's condition.   Johnn Hai, MD 02/03/2018, 10:38 AM

## 2018-02-03 NOTE — Progress Notes (Signed)
DAR. Pt has in upon initial interaction seated beside the eating snacks. Pt stated he had a good day, denied pain, denied SI/HI/AVH at this time. Pt observed on his feet, not very steady, uses walker as needed and compliant. Pt given all his medications as scheduled, did not take a repeat trazodone as pt was fast as asleep. Pt's safety ensured with 15 minute and environmental checks. Pt verbally agrees to seek staff if SI/HI or A/VH occurs and to consult with staff before acting on any harmful thoughts. Will continue POC.

## 2018-02-03 NOTE — Progress Notes (Signed)
NUTRITION ASSESSMENT  Pt identified as at risk on the Malnutrition Screen Tool  INTERVENTION: - Will order Ensure Enlive po BID, each supplement provides 350 kcal and 20 grams of protein - Will order daily multivitamin with minerals. - Continue to encourage PO intakes.    NUTRITION DIAGNOSIS: Unintentional weight loss related to sub-optimal intake as evidenced by pt report.   Goal: Pt to meet >/= 90% of their estimated nutrition needs.  Monitor:  PO intake  Assessment:  Patient admitted following OD on baclofen. He was originally at [facility] and was intubated, subsequently transferred to [facility]. He was extubated on 1/12 and then transferred to [facility] on 1/15.   Per chart review, patient currently weighs 173 lb. Weight had trended up from 11/2016-10/2017. On 10/7 patient weighed 205 lb. Based on current weight, this indicates 32 lb weight loss (15.6% body weight) in the past 3 months; significant for time frame.    48 y.o. male  Height: Ht Readings from Last 1 Encounters:  02/02/18 5\' 7"  (1.702 m)    Weight: Wt Readings from Last 1 Encounters:  02/02/18 78.5 kg    Weight Hx: Wt Readings from Last 10 Encounters:  02/02/18 78.5 kg  02/02/18 76.7 kg  10/25/17 93 kg  09/28/17 92.1 kg  07/13/17 88.9 kg  06/01/17 88.5 kg  12/01/16 76.4 kg  11/21/16 68 kg  11/17/16 81.6 kg  10/15/16 81.6 kg    BMI:  Body mass index is 27.1 kg/m. Pt meets criteria for overweight based on current BMI.  Estimated Nutritional Needs: Kcal: 25-30 kcal/kg Protein: > 1 gram protein/kg Fluid: 1 ml/kcal  Diet Order:  Diet Order            Diet regular Room service appropriate? Yes; Fluid consistency: Thin  Diet effective now             Pt is also offered choice of unit snacks mid-morning and mid-afternoon.  Pt is eating as desired.   Lab results and medications reviewed.     Jarome Matin, MS, RD, LDN, [facility] Inpatient Clinical Dietitian Pager # [phone] After hours/weekend  pager # [phone]

## 2018-02-03 NOTE — Plan of Care (Signed)
  Problem: Activity: Goal: Sleeping patterns will improve Outcome: Progressing   Problem: Coping: Goal: Ability to verbalize frustrations and anger appropriately will improve Outcome: Progressing   

## 2018-02-03 NOTE — BHH Counselor (Signed)
CSW attempted to meet with pt 2x this morning to complete psychosocial assessment. Pt difficult to rouse, immediately closed eyes and went back to sleep both times. CSW will attempt assessment again this afternoon.  Agata Lucente S. Ouida Sills, MSW, LCSW Clinical Social Worker 02/03/2018 10:48 AM

## 2018-02-03 NOTE — [facility] Group Notes (Signed)
LCSW Group Therapy Note 02/03/2018 2:36 PM  Type of Therapy and Topic: Group Therapy: DBT House  Participation Level: Did Not Attend   Description of Group:  In this group patients will be encouraged to explore  their values, behaviors they want to change, emotions they wish to increase, protective factors, supports, coping skills, and motivational factors. They will be guided to discuss their thoughts, feelings, and behaviors related to these obstacles. The group will be asked to individually process the activity and share their insights with the group. This group will be process-oriented, with patients participating in exploration of their own experiences as well as giving and receiving support and challenge from other group members.   Therapeutic Goals: 1. Patient will identify their values 2. Patient will identify behaviors they wish to modify  3. Patient will identify feelings and emotions they wish to increase 4. Patient will identify strengths, supports, protective factors 5. Patient will identify coping skills 6. Patient will identify goals and motivating factors for change   Summary of Patient Progress Invited, chose not to attend.  Therapeutic Modalities:  Dialectical Behavioral Therapy  Motivational Interviewing Relapse Prevention Therapy

## 2018-02-04 MED ORDER — CLONAZEPAM 0.5 MG PO TABS
0.2500 mg | ORAL_TABLET | Freq: Two times a day (BID) | ORAL | Status: DC
  Administered 2018-02-04 – 2018-02-07 (×6): 0.25 mg via ORAL
  Filled 2018-02-04 (×7): qty 1

## 2018-02-04 MED ORDER — CARBAMAZEPINE 100 MG PO CHEW
50.0000 mg | CHEWABLE_TABLET | Freq: Three times a day (TID) | ORAL | Status: DC
  Administered 2018-02-04 – 2018-02-07 (×10): 50 mg via ORAL
  Filled 2018-02-04 (×4): qty 0.5
  Filled 2018-02-04: qty 1
  Filled 2018-02-04 (×8): qty 0.5
  Filled 2018-02-04: qty 1
  Filled 2018-02-04 (×3): qty 0.5

## 2018-02-04 MED ORDER — VORTIOXETINE HBR 10 MG PO TABS
10.0000 mg | ORAL_TABLET | Freq: Every day | ORAL | Status: DC
  Administered 2018-02-04 – 2018-02-07 (×4): 10 mg via ORAL
  Filled 2018-02-04 (×7): qty 1

## 2018-02-04 NOTE — Tx Team (Signed)
Interdisciplinary Treatment and Diagnostic Plan Update  02/04/2018 Time of Session: 9:00am Benjamin [name] MRN: [id number]  Principal Diagnosis: <principal problem not specified>  Secondary Diagnoses: Active Problems:   MDD (major depressive disorder), severe (HCC)   Current Medications:  Current Facility-Administered Medications  Medication Dose Route Frequency Provider Last Rate Last Dose  . albuterol (PROVENTIL HFA;VENTOLIN HFA) 108 (90 Base) MCG/ACT inhaler 1-2 puff  1-2 puff Inhalation Q6H PRN Patriciaann Clan E, PA-C      . amLODipine (NORVASC) tablet 10 mg  10 mg Oral Daily Patriciaann Clan E, PA-C   10 mg at 02/04/18 0744  . carbamazepine (TEGRETOL) chewable tablet 50 mg  50 mg Oral TID Johnn Hai, MD      . clonazePAM Bobbye [place]) tablet 0.25 mg  0.25 mg Oral BID Johnn Hai, MD      . feeding supplement (ENSURE ENLIVE) (ENSURE ENLIVE) liquid 237 mL  237 mL Oral BID BM Patriciaann Clan E, PA-C   237 mL at 02/03/18 0939  . folic acid (FOLVITE) tablet 1 mg  1 mg Oral Daily Patriciaann Clan E, PA-C   1 mg at 02/04/18 0745  . hydrOXYzine (ATARAX/VISTARIL) tablet 25 mg  25 mg Oral Q6H PRN Laverle Hobby, PA-C   25 mg at 02/03/18 8588  . metoprolol tartrate (LOPRESSOR) tablet 25 mg  25 mg Oral BID Johnn Hai, MD   25 mg at 02/04/18 0744  . multivitamin with minerals tablet 1 tablet  1 tablet Oral Daily Laverle Hobby, PA-C   1 tablet at 02/04/18 0745  . pantoprazole (PROTONIX) EC tablet 40 mg  40 mg Oral Daily Laverle Hobby, PA-C   40 mg at 02/04/18 0744  . sucralfate (CARAFATE) 1 GM/10ML suspension 1 g  1 g Oral TID WC & HS Patriciaann Clan E, PA-C   1 g at 02/04/18 0745  . temazepam (RESTORIL) capsule 30 mg  30 mg Oral QHS Johnn Hai, MD   30 mg at 02/03/18 2120  . thiamine (VITAMIN B-1) tablet 100 mg  100 mg Oral Daily Patriciaann Clan E, PA-C   100 mg at 02/04/18 0745  . traZODone (DESYREL) tablet 50 mg  50 mg Oral QHS,MR X 1 Laverle Hobby, PA-C   50 mg at 02/03/18 2120  .  vortioxetine HBr (TRINTELLIX) tablet 10 mg  10 mg Oral Daily Johnn Hai, MD       PTA Medications: Medications Prior to Admission  Medication Sig Dispense Refill Last Dose  . albuterol (PROVENTIL HFA;VENTOLIN HFA) 108 (90 Base) MCG/ACT inhaler Inhale 3 puffs into the lungs every 4 (four) hours as needed for wheezing or shortness of breath.    Not Taking at Unknown time  . amLODipine (NORVASC) 10 MG tablet Take 1 tablet (10 mg total) by mouth daily.     . busPIRone (BUSPAR) 10 MG tablet Take 10 mg by mouth 3 (three) times daily.   Not Taking at Unknown time  . CARAFATE 1 GM/10ML suspension TAKE 10 MLS BY MOUTH FOUR TIMES DAILY. (Patient taking differently: Take 1 g by mouth 4 (four) times daily. ) 420 mL 1 unknown  . citalopram (CELEXA) 20 MG tablet Take 20 mg by mouth daily.   unknown  . clonazePAM (KLONOPIN) 0.5 MG tablet Take 1 tablet (0.5 mg total) by mouth 2 (two) times daily.     Marland Kitchen DEXILANT 60 MG capsule TAKE ONE CAPSULE BY MOUTH ONCE DAILY. (Patient taking differently: Take 60 mg by mouth daily. ) 30 capsule 3  unknown  . folic acid (FOLVITE) 1 MG tablet Take 1 tablet (1 mg total) by mouth daily.     . metoprolol tartrate (LOPRESSOR) 25 MG tablet Take 0.5 tablets (12.5 mg total) by mouth 2 (two) times daily.     . Multiple Vitamin (MULTIVITAMIN WITH MINERALS) TABS tablet Take 1 tablet by mouth daily.     Marland Kitchen thiamine 100 MG tablet Take 1 tablet (100 mg total) by mouth daily.       Patient Stressors: Financial difficulties Substance abuse  Patient Strengths: Ability for insight Capable of independent living Communication skills Supportive family/friends  Treatment Modalities: Medication Management, Group therapy, Case management,  1 to 1 session with clinician, Psychoeducation, Recreational therapy.   Physician Treatment Plan for Primary Diagnosis: <principal problem not specified> Long Term Goal(s): Improvement in symptoms so as ready for discharge Improvement in symptoms so as  ready for discharge   Short Term Goals: Ability to verbalize feelings will improve Ability to demonstrate self-control will improve Ability to maintain clinical measurements within normal limits will improve  Medication Management: Evaluate patient's response, side effects, and tolerance of medication regimen.  Therapeutic Interventions: 1 to 1 sessions, Unit Group sessions and Medication administration.  Evaluation of Outcomes: Not Met  Physician Treatment Plan for Secondary Diagnosis: Active Problems:   MDD (major depressive disorder), severe ([place])  Long Term Goal(s): Improvement in symptoms so as ready for discharge Improvement in symptoms so as ready for discharge   Short Term Goals: Ability to verbalize feelings will improve Ability to demonstrate self-control will improve Ability to maintain clinical measurements within normal limits will improve     Medication Management: Evaluate patient's response, side effects, and tolerance of medication regimen.  Therapeutic Interventions: 1 to 1 sessions, Unit Group sessions and Medication administration.  Evaluation of Outcomes: Not Met   RN Treatment Plan for Primary Diagnosis: <principal problem not specified> Long Term Goal(s): Knowledge of disease and therapeutic regimen to maintain health will improve  Short Term Goals: Ability to participate in decision making will improve, Ability to verbalize feelings will improve, Ability to disclose and discuss suicidal ideas and Ability to identify and develop effective coping behaviors will improve  Medication Management: RN will administer medications as ordered by provider, will assess and evaluate patient's response and provide education to patient for prescribed medication. RN will report any adverse and/or side effects to prescribing provider.  Therapeutic Interventions: 1 on 1 counseling sessions, Psychoeducation, Medication administration, Evaluate responses to treatment, Monitor  vital signs and CBGs as ordered, Perform/monitor CIWA, COWS, AIMS and Fall Risk screenings as ordered, Perform wound care treatments as ordered.  Evaluation of Outcomes: Not Met   LCSW Treatment Plan for Primary Diagnosis: <principal problem not specified> Long Term Goal(s): Safe transition to appropriate next level of care at discharge, Engage patient in therapeutic group addressing interpersonal concerns.  Short Term Goals: Engage patient in aftercare planning with referrals and resources, Increase social support, Increase emotional regulation, Facilitate patient progression through stages of change regarding substance use diagnoses and concerns, Identify triggers associated with mental health/substance abuse issues and Increase skills for wellness and recovery  Therapeutic Interventions: Assess for all discharge needs, 1 to 1 time with Social worker, Explore available resources and support systems, Assess for adequacy in community support network, Educate family and significant other(s) on suicide prevention, Complete Psychosocial Assessment, Interpersonal group therapy.  Evaluation of Outcomes: Not Met   Progress in Treatment: Attending groups: No. Participating in groups: No. Taking medication as prescribed: Yes.  Toleration medication: Yes. Family/Significant other contact made: No, will contact:  Supports if consent is granted Patient understands diagnosis: Yes. Discussing patient identified problems/goals with staff: No. Medical problems stabilized or resolved: No. Denies suicidal/homicidal ideation: Yes. Issues/concerns per patient self-inventory: No.  New problem(s) identified: No, Describe:  CSW continuing to assess  New Short Term/Long Term Goal(s): detox, medication management for mood stabilization; elimination of SI thoughts; development of comprehensive mental wellness/sobriety plan.  Patient Goals:  Chose not to attend [place] team, stated to [place] his goals are "get myself  together."  Discharge Plan or Barriers: Continuing to assess. [place] pamphlet, Mobile Crisis information, and AA/NA information provided to patient for additional community support and resources.   Reason for Continuation of Hospitalization: Anxiety Depression Medical Issues Withdrawal symptoms  Estimated Length of Stay: 02/07/2018  Attendees: Patient: Benjamin [name] 02/04/2018 9:28 AM  Physician:  02/04/2018 9:28 AM  Nursing:  02/04/2018 9:28 AM  RN Care Manager: 02/04/2018 9:28 AM  Social Worker: Stephanie Acre, [place] 02/04/2018 9:28 AM  Recreational Therapist:  02/04/2018 9:28 AM  Other:  02/04/2018 9:28 AM  Other:  02/04/2018 9:28 AM  Other: 02/04/2018 9:28 AM    Scribe for Treatment Team: Joellen [place], [place] 02/04/2018 9:28 AM

## 2018-02-04 NOTE — [facility] Suicide Risk Assessment (Signed)
[place] INPATIENT:  Family/Significant Other Suicide Prevention Education  Suicide Prevention Education:  Patient Refusal for Family/Significant Other Suicide Prevention Education: The patient Benjamin [name] has refused to provide written consent for family/significant other to be provided Family/Significant Other Suicide Prevention Education during admission and/or prior to discharge.  Physician notified.  SPE completed with pt, as pt refused to consent to family contact. SPI pamphlet provided to pt and pt was encouraged to share information with support network, ask questions, and talk about any concerns relating to SPE. Pt denies access to guns/firearms and verbalized understanding of information provided. Mobile Crisis information also provided to pt.   Avelina Laine LCSW 02/04/2018, 10:53 AM

## 2018-02-04 NOTE — [facility] Group Notes (Signed)
LCSW Group Therapy Note   02/04/2018 1:15pm   Type of Therapy and Topic:  Group Therapy:  Overcoming Obstacles   Participation Level:  Did Not Attend--pt invtied. Chose to remain in bed to rest.   Description of Group:    In this group patients will be encouraged to explore what they see as obstacles to their own wellness and recovery. They will be guided to discuss their thoughts, feelings, and behaviors related to these obstacles. The group will process together ways to cope with barriers, with attention given to specific choices patients can make. Each patient will be challenged to identify changes they are motivated to make in order to overcome their obstacles. This group will be process-oriented, with patients participating in exploration of their own experiences as well as giving and receiving support and challenge from other group members.   Therapeutic Goals: 1. Patient will identify personal and current obstacles as they relate to admission. 2. Patient will identify barriers that currently interfere with their wellness or overcoming obstacles.  3. Patient will identify feelings, thought process and behaviors related to these barriers. 4. Patient will identify two changes they are willing to make to overcome these obstacles:      Summary of Patient Progress   x   Therapeutic Modalities:   Cognitive Behavioral Therapy Solution Focused Therapy Motivational Interviewing Relapse Prevention Therapy  Benjamin [name], [place] 02/04/2018 2:26 PM

## 2018-02-04 NOTE — Plan of Care (Signed)
  Problem: Education: Goal: Emotional status will improve Outcome: Progressing Goal: Mental status will improve Outcome: Progressing Goal: Verbalization of understanding the information provided will improve Outcome: Progressing   Problem: Activity: Goal: Interest or engagement in activities will improve Outcome: Progressing   
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
## 2018-02-04 NOTE — Progress Notes (Signed)
Recreation Therapy Notes  Date: 1.17.20 Time: 0930 Location: [address] Dayroom  Group Topic: Stress Management  Goal Area(s) Addresses:  Patient will identify stress management techniques. Patient will identify benefit of using stress management post d/c.  Intervention:  Stress Management  Activity :  Progressive Muscle Relaxation.  LRT introduced the stress management technique of progressive muscle relaxation.  LRT lead the group in tensing and relaxing each muscle individually.  Patients were to follow along as scrip was read to engage in activity.  Education:  Stress Management, Discharge Planning.   Education Outcome: Acknowledges Education  Clinical Observations/Feedback: Pt did not attend group.     Victorino Sparrow, LRT/CTRS         Victorino Sparrow A 02/04/2018 11:37 AM
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
## 2018-02-05 NOTE — Progress Notes (Signed)
DAR: Pt has been isolative all evening, pt stated he does not participate in activities due to his back problems. Pt has been up all night long even after taking trazodone with a repeat. Pt uses walker to ambulate due to unsteady gait, denies SI/HI/AVH, kept on Q 15 min checks for safety, pt remains safe on the unit will continue to monitor.

## 2018-02-05 NOTE — Progress Notes (Signed)
Patient did not attend the evening speaker [place] meeting. Pt was notified that group was beginning but remained in room.

## 2018-02-05 NOTE — BHH Group Notes (Signed)
[facility] Group Notes: (Clinical Social Work)   02/05/2018      Type of Therapy:  Group Therapy   Participation Level:  Did Not Attend despite MHT prompting   Selmer Dominion, LCSW 02/05/2018, 11:16 AM
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
## 2018-02-05 NOTE — Plan of Care (Addendum)
  Problem: Activity: Goal: Interest or engagement in activities will improve Outcome: Not Progressing  D: Pt alert and oriented on the unit. Pt denies SI/HI, A/VH. Pt isolated in his room and in his bed most of the day and did not participate during group activities. Pt is cooperative. A: Education, support and encouragement provided, q15 minute safety checks remain in effect. Medications administered per MD orders. R: No reactions/side effects to medicine noted. Pt denies any concerns at this time, and verbally contracts for safety. Pt ambulating on the unit with no issues. Pt remains safe on and off the unit.

## 2018-02-06 MED ORDER — LIDOCAINE 5 % EX PTCH
1.0000 | MEDICATED_PATCH | CUTANEOUS | Status: DC
  Administered 2018-02-06 – 2018-02-07 (×2): 1 via TRANSDERMAL
  Filled 2018-02-06 (×4): qty 1

## 2018-02-06 NOTE — BHH Group Notes (Signed)
[place] [facility] Group Notes: (Clinical Social Work)   02/06/2018      Type of Therapy:  Group Therapy   Participation Level:  Did Not Attend despite MHT prompting   Selmer Dominion, LCSW 02/06/2018, 1:29 PM

## 2018-02-06 NOTE — Progress Notes (Signed)
Psychoeducational Group Note  Date:  02/06/2018 Time: 2357  Group Topic/Focus:  Wrap-Up Group:   The focus of this group is to help patients review their daily goal of treatment and discuss progress on daily workbooks.  Participation Level: Did Not Attend  Participation Quality:  Not Applicable  Affect:  Not Applicable  Cognitive:  Not Applicable  Insight:  Not Applicable  Engagement in Group: Not Applicable  Additional Comments:  The patient did not attend group since he refused to attend.   Archie Balboa S 02/06/2018, 11:57 PM
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
## 2018-02-06 NOTE — Plan of Care (Signed)
  Problem: Activity: Goal: Interest or engagement in activities will improve Outcome: Not Progressing   D: Pt alert and oriented on the unit. Pt engaging with RN staff and other pts. Pt denies SI/HI, A/VH. Pt's affect was flat and mood depressed. Pt isolated in his room and did not attend any groups or participate in any unit activities. Pt is cooperative. A: Education, support and encouragement provided, q15 minute safety checks remain in effect. Medications administered per MD orders. R: No reactions/side effects to medicine noted. Pt denies any concerns at this time, and verbally contracts for safety. Pt remains safe on the unit.

## 2018-02-06 NOTE — Progress Notes (Signed)
D: Patient observed isolative to room this evening. Did not attend group. Patient asking for meds early. States, "I don't like it here. I'm ready to go. That's why I asked them today about discharging me." Patient's affect flat, mood pleasant. Denies pain, physical complaints.   A: Medicated per orders, no prns requested or required. Medication education provided. Level III obs in place for safety. Emotional support offered. Patient encouraged to complete Suicide Safety Plan before discharge. Encouraged to attend and participate in unit programming.  Fall prevention plan in place and reviewed with patient as pt is a high fall risk.   R: Patient verbalizes understanding of POC, falls prevention education. Patient denies SI/HI/AVH and remains safe on level III obs. Will continue to monitor throughout the night.

## 2018-02-07 DIAGNOSIS — F332 Major depressive disorder, recurrent severe without psychotic features: Principal | ICD-10-CM

## 2018-02-07 MED ORDER — ADULT MULTIVITAMIN W/MINERALS CH: 1.0000 | ORAL_TABLET | Freq: Every day | ORAL | Status: DC

## 2018-02-07 MED ORDER — HYDROXYZINE HCL 25 MG PO TABS: 25.0000 mg | ORAL_TABLET | Freq: Four times a day (QID) | ORAL | 0 refills | Status: DC | PRN

## 2018-02-07 MED ORDER — FOLIC ACID 1 MG PO TABS: 1.0000 mg | ORAL_TABLET | Freq: Every day | ORAL | 0 refills | Status: DC

## 2018-02-07 MED ORDER — TEMAZEPAM 30 MG PO CAPS: 30.0000 mg | ORAL_CAPSULE | Freq: Every day | ORAL | 0 refills | Status: DC

## 2018-02-07 MED ORDER — ALBUTEROL SULFATE HFA 108 (90 BASE) MCG/ACT IN AERS: 1.0000 | INHALATION_SPRAY | Freq: Four times a day (QID) | RESPIRATORY_TRACT | Status: DC | PRN

## 2018-02-07 MED ORDER — CARBAMAZEPINE 100 MG PO CHEW: 50.0000 mg | CHEWABLE_TABLET | Freq: Three times a day (TID) | ORAL | 0 refills | Status: DC

## 2018-02-07 MED ORDER — THIAMINE HCL 100 MG PO TABS: 100.0000 mg | ORAL_TABLET | Freq: Every day | ORAL | 0 refills | Status: DC

## 2018-02-07 MED ORDER — AMLODIPINE BESYLATE 10 MG PO TABS: 10.0000 mg | ORAL_TABLET | Freq: Every day | ORAL | 0 refills | Status: DC

## 2018-02-07 MED ORDER — LIDOCAINE 5 % EX PTCH: 1.0000 | MEDICATED_PATCH | CUTANEOUS | 0 refills | Status: DC

## 2018-02-07 MED ORDER — PANTOPRAZOLE SODIUM 40 MG PO TBEC: 40.0000 mg | DELAYED_RELEASE_TABLET | Freq: Every day | ORAL | 0 refills | Status: DC

## 2018-02-07 MED ORDER — SUCRALFATE 1 GM/10ML PO SUSP: 1.0000 g | Freq: Three times a day (TID) | ORAL | 0 refills | Status: DC

## 2018-02-07 MED ORDER — TRAZODONE HCL 50 MG PO TABS: 50.0000 mg | ORAL_TABLET | Freq: Every evening | ORAL | 0 refills | Status: DC | PRN

## 2018-02-07 MED ORDER — CLONAZEPAM 0.5 MG PO TABS: 0.2500 mg | ORAL_TABLET | Freq: Two times a day (BID) | ORAL | 0 refills | Status: DC

## 2018-02-07 MED ORDER — METOPROLOL TARTRATE 25 MG PO TABS: 25.0000 mg | ORAL_TABLET | Freq: Two times a day (BID) | ORAL | 0 refills | Status: DC

## 2018-02-07 MED ORDER — VORTIOXETINE HBR 10 MG PO TABS: 10.0000 mg | ORAL_TABLET | Freq: Every day | ORAL | 0 refills | Status: DC

## 2018-02-07 NOTE — Progress Notes (Signed)
Recreation Therapy Notes  Date: 1.20.20   Time: 0930 Location: 300 Hall Dayroom  Group Topic: Stress Management  Goal Area(s) Addresses:  Patient will identify stress management techniques. Patient will identify benefit of using stress management post d/c.   Behavioral Response: Engaged  Intervention: Stress Management  Activity : Guided Imagery.  LRT introduced the stress management technique of guided imagery.  LRT read a script that let patients envision laying outside in a meadow at summer time.  Patients were to listen and follow along as script was read to engage in activity.  Education:  Stress Management, Discharge Planning.   Education Outcome: Acknowledges Education  Clinical Observations/Feedback: Pt attended and participated group.    Victorino Sparrow, LRT/CTRS    Victorino Sparrow A 02/07/2018 11:55 AM

## 2018-02-07 NOTE — [facility] Suicide Risk Assessment (Signed)
[facility] Discharge Suicide Risk Assessment   Principal Problem: <principal problem not specified> Discharge Diagnoses: Active Problems:   MDD (major depressive disorder), severe ([place])   Total Time spent with patient: 15 minutes  Musculoskeletal: Strength & Muscle Tone: within normal limits Gait & Station: normal Patient leans: N/A  Psychiatric Specialty Exam: Review of Systems  All other systems reviewed and are negative.   Blood pressure (!) 128/93, pulse (!) 102, temperature 98 F (36.7 C), resp. rate 16, height 5\' 7"  (1.702 m), weight 78.5 kg.Body mass index is 27.1 kg/m.  General Appearance: Casual  Eye Contact::  Fair  Speech:  Normal Rate409  Volume:  Normal  Mood:  Euthymic  Affect:  Congruent  Thought Process:  Coherent and Descriptions of Associations: Intact  Orientation:  Full (Time, Place, and Person)  Thought Content:  Logical  Suicidal Thoughts:  No  Homicidal Thoughts:  No  Memory:  Immediate;   Fair Recent;   Fair Remote;   Fair  Judgement:  Intact  Insight:  Fair  Psychomotor Activity:  Normal  Concentration:  Good  Recall:  Good  Fund of Knowledge:Good  Language: Good  Akathisia:  Negative  Handed:  Right  AIMS (if indicated):     Assets:  Communication Skills Desire for Improvement Housing Physical Health Resilience  Sleep:  Number of Hours: 6.75  Cognition: WNL  ADL's:  Intact   Mental Status Per Nursing Assessment::   On Admission:  Self-harm thoughts  Demographic Factors:  Male, Caucasian, Low socioeconomic status and Unemployed  Loss Factors: Financial problems/change in socioeconomic status  Historical Factors: Impulsivity  Risk Reduction Factors:   Living with another person, especially a relative and Positive social support  Continued Clinical Symptoms:  Depression:   Comorbid alcohol abuse/dependence Impulsivity Alcohol/Substance Abuse/Dependencies  Cognitive Features That Contribute To Risk:  None    Suicide Risk:   Minimal: No identifiable suicidal ideation.  Patients presenting with no risk factors but with morbid ruminations; may be classified as minimal risk based on the severity of the depressive symptoms  [place], Youth Follow up on 02/21/2018.   Why:  Therapy appt on Monday, 2/3 at 10:00AM. Medication management on Tuesday 2/4 at 2:00PM. Thank you.  Contact information: [address] [place] [address] [phone]           Plan Of Care/Follow-up recommendations:  Activity:  ad lib  Sharma Covert, MD 02/07/2018, 11:28 AM

## 2018-02-07 NOTE — Progress Notes (Signed)
Patient ID: Benjamin [name], male   DOB: [birth year], 48 y.o.   MRN: [id number]  Pt discharged to lobby. Pt was stable and appreciative at that time. All papers and prescriptions were given and valuables returned. Verbal understanding expressed. Denies SI/HI and A/VH. Pt given opportunity to express concerns and ask questions.

## 2018-02-07 NOTE — Progress Notes (Signed)
Patient ID: Benjamin [name], male   DOB: April 23, 1970, 48 y.o.   MRN: [id number]  Pt currently presents with an anxious affect and behavior.  Pt reports ongoing anxiety. Pt reports to writer that their goal is to "stay positive and keep calm." Pt expresses that he would like to be discharged to his parents house. Pt reports fair sleep with current medication regimen.   Pt provided with medications per providers orders. Pt's labs and vitals were monitored throughout the night. Pt supported emotionally and encouraged to express concerns and questions. Pt educated on medications and suicide prevention precautions.  Pt's safety ensured with 15 minute and environmental checks. Pt currently denies SI/HI and A/V hallucinations. Pt verbally agrees to seek staff if SI/HI or A/VH occurs and to consult with staff before acting on any harmful thoughts. Will continue POC.
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
## 2018-02-07 NOTE — Progress Notes (Addendum)
  [facility] Adult Case Management Discharge Plan :  Will you be returning to the same living situation after discharge:  Yes,  home At discharge, do you have transportation home?: Yes,  family member Do you have the ability to pay for your medications: Yes,  cardinal medicaid  Release of information consent forms completed and in the chart; submitted to medical records by CSW  Patient to Follow up at: [facility] Follow up on 02/21/2018.   Why:  Therapy appt on Monday, 2/3 at 10:00AM. Medication management on Tuesday 2/4 at 2:00PM. Thank you.  Contact information: [address] [place] [address] [phone]           Next level of care provider has access to [place] and Suicide Prevention discussed: Yes,  SPE completed with pt, pt declined to consent to collateral contact; SPI pamphlet provided.  Have you used any form of tobacco in the last 30 days? (Cigarettes, Smokeless Tobacco, Cigars, and/or Pipes): Yes  Has patient been referred to the Quitline?: Patient refused referral  Patient has been referred for addiction treatment: Yes  Avelina Laine, LCSW 02/07/2018, 9:12 AM

## 2018-02-07 NOTE — Progress Notes (Signed)
D: Patient observed isolative to room much of the evening. Comes out for short stints when he needs something. Interaction with peers is minimal. Did not attend AA. Remains focused on discharge. Patient states, "I should be leaving tomorrow. I don't really have any concerns." Patient's affect flat, mood pleasant. Denies pain, physical complaints.   A: Medicated per orders, no prns requested or required. Medication education provided. Level III obs in place for safety. Emotional support offered. Patient encouraged to complete Suicide Safety Plan before discharge. Encouraged to attend and participate in unit programming.  Fall prevention plan in place and reviewed with patient as pt is a high fall risk. Using walker prn.   R: Patient verbalizes understanding of POC, falls prevention education. Patient denies SI/HI/AVH and remains safe on level III obs. Will continue to monitor throughout the night.

## 2018-02-15 ENCOUNTER — Ambulatory Visit: Payer: Medicaid Other | Admitting: Gastroenterology

## 2018-02-17 LAB — FUNGUS CULTURE WITH STAIN

## 2018-02-17 LAB — FUNGAL ORGANISM REFLEX

## 2018-02-17 LAB — FUNGUS CULTURE RESULT

## 2018-04-16 ENCOUNTER — Other Ambulatory Visit: Payer: Self-pay | Admitting: Nurse Practitioner

## 2018-04-20 ENCOUNTER — Encounter: Payer: Self-pay | Admitting: Gastroenterology

## 2018-04-20 ENCOUNTER — Ambulatory Visit (INDEPENDENT_AMBULATORY_CARE_PROVIDER_SITE_OTHER): Payer: Medicaid Other | Admitting: Gastroenterology

## 2018-04-20 ENCOUNTER — Other Ambulatory Visit: Payer: Self-pay

## 2018-04-20 DIAGNOSIS — K219 Gastro-esophageal reflux disease without esophagitis: Secondary | ICD-10-CM

## 2018-04-20 DIAGNOSIS — K5900 Constipation, unspecified: Secondary | ICD-10-CM

## 2018-04-20 MED ORDER — LUBIPROSTONE 24 MCG PO CAPS: ORAL_CAPSULE | ORAL | 5 refills | Status: DC

## 2018-04-20 MED ORDER — DEXLANSOPRAZOLE 60 MG PO CPDR: 60.0000 mg | DELAYED_RELEASE_CAPSULE | Freq: Every day | ORAL | 11 refills | Status: DC
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
## 2018-04-20 NOTE — Patient Instructions (Signed)
1. Prescription for Dexilant 60 mg daily before breakfast sent to your pharmacy. 2. Prescription for Amitiza 24 mcg to take 1 tab once to twice daily for constipation.  3. Return to the office in 6 months or call sooner if needed.

## 2018-04-25 ENCOUNTER — Encounter: Payer: Self-pay | Admitting: Internal Medicine

## 2018-04-25 ENCOUNTER — Telehealth: Payer: Self-pay | Admitting: Internal Medicine

## 2018-04-25 NOTE — Progress Notes (Signed)
No pcp per patient 

## 2018-04-25 NOTE — Telephone Encounter (Signed)
Pt called to say that [place] said it would be quicker for him if we did the PA for his Dexilant. I asked him was the pharmacy going to fax [place] the forms and he didn't know. Please advise. [phone]

## 2018-04-25 NOTE — Telephone Encounter (Signed)
PA for Dexilant 60 mg was submitted by phone for [place] [facility]. Medication approval number is [id number]. Lmom, pt is aware of approval.

## 2018-06-03 ENCOUNTER — Other Ambulatory Visit: Payer: Self-pay

## 2018-06-03 ENCOUNTER — Encounter (HOSPITAL_COMMUNITY): Payer: Self-pay | Admitting: Emergency Medicine

## 2018-06-03 ENCOUNTER — Emergency Department (HOSPITAL_COMMUNITY)
Admission: EM | Admit: 2018-06-03 | Discharge: 2018-06-03 | Disposition: A | Discharge status: Home / Self Care | Payer: Medicaid Other | Attending: Emergency Medicine | Admitting: Emergency Medicine

## 2018-06-03 DIAGNOSIS — I10 Essential (primary) hypertension: Secondary | ICD-10-CM | POA: Diagnosis not present

## 2018-06-03 DIAGNOSIS — Q600 Renal agenesis, unilateral: Secondary | ICD-10-CM | POA: Diagnosis not present

## 2018-06-03 DIAGNOSIS — L739 Follicular disorder, unspecified: Secondary | ICD-10-CM | POA: Diagnosis not present

## 2018-06-03 DIAGNOSIS — F17210 Nicotine dependence, cigarettes, uncomplicated: Secondary | ICD-10-CM | POA: Insufficient documentation

## 2018-06-03 DIAGNOSIS — Z79899 Other long term (current) drug therapy: Secondary | ICD-10-CM | POA: Insufficient documentation

## 2018-06-03 DIAGNOSIS — R21 Rash and other nonspecific skin eruption: Secondary | ICD-10-CM | POA: Diagnosis present

## 2018-06-03 MED ORDER — DOXYCYCLINE HYCLATE 100 MG PO CAPS: 100.0000 mg | ORAL_CAPSULE | Freq: Two times a day (BID) | ORAL | 0 refills | Status: DC

## 2018-06-03 MED ORDER — DOXYCYCLINE HYCLATE 100 MG PO TABS
100.0000 mg | ORAL_TABLET | Freq: Once | ORAL | Status: AC
  Administered 2018-06-03: 100 mg via ORAL
  Filled 2018-06-03: qty 1

## 2018-06-03 NOTE — ED Notes (Signed)
ED Provider at bedside. 
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
## 2018-06-03 NOTE — ED Triage Notes (Signed)
Pt C/O "red bites all over my body." Pt states that he did get bit by a tick 1 month ago. Pt denies fevers at home.

## 2018-06-05 ENCOUNTER — Emergency Department (HOSPITAL_COMMUNITY)
Admission: EM | Admit: 2018-06-05 | Discharge: 2018-06-05 | Disposition: A | Discharge status: Home / Self Care | Payer: Medicaid Other | Attending: Emergency Medicine | Admitting: Emergency Medicine

## 2018-06-05 ENCOUNTER — Encounter (HOSPITAL_COMMUNITY): Payer: Self-pay

## 2018-06-05 ENCOUNTER — Other Ambulatory Visit: Payer: Self-pay

## 2018-06-05 DIAGNOSIS — R52 Pain, unspecified: Secondary | ICD-10-CM | POA: Diagnosis present

## 2018-06-05 DIAGNOSIS — I10 Essential (primary) hypertension: Secondary | ICD-10-CM | POA: Insufficient documentation

## 2018-06-05 DIAGNOSIS — G629 Polyneuropathy, unspecified: Secondary | ICD-10-CM | POA: Diagnosis not present

## 2018-06-05 DIAGNOSIS — R51 Headache: Secondary | ICD-10-CM | POA: Diagnosis not present

## 2018-06-05 LAB — CBC
HCT: 48 % (ref 39.0–52.0)
Hemoglobin: 16.3 g/dL (ref 13.0–17.0)
MCH: 29.9 pg (ref 26.0–34.0)
MCHC: 34 g/dL (ref 30.0–36.0)
MCV: 88.1 fL (ref 80.0–100.0)
Platelets: 205 10*3/uL (ref 150–400)
RBC: 5.45 MIL/uL (ref 4.22–5.81)
RDW: 12.6 % (ref 11.5–15.5)
WBC: 9.6 10*3/uL (ref 4.0–10.5)
nRBC: 0 % (ref 0.0–0.2)

## 2018-06-05 LAB — BASIC METABOLIC PANEL
Anion gap: 10 (ref 5–15)
BUN: 12 mg/dL (ref 6–20)
CO2: 24 mmol/L (ref 22–32)
Calcium: 9.3 mg/dL (ref 8.9–10.3)
Chloride: 106 mmol/L (ref 98–111)
Creatinine, Ser: 1.1 mg/dL (ref 0.61–1.24)
GFR calc Af Amer: >60 mL/min (ref >60)
GFR calc non Af Amer: >60 mL/min (ref >60)
Glucose, Bld: 105 mg/dL — ABNORMAL HIGH (ref 70–99)
Potassium: 3.9 mmol/L (ref 3.5–5.1)
Sodium: 140 mmol/L (ref 135–145)

## 2018-06-05 MED ORDER — PREGABALIN 200 MG PO CAPS: 200.0000 mg | ORAL_CAPSULE | Freq: Two times a day (BID) | ORAL | 1 refills | Status: DC

## 2018-06-05 NOTE — Discharge Instructions (Addendum)
Resume taking the Lyrica.  Follow-up with your doctor to see if you should continue that but they might consider tapering it off if they want to discontinue it again.

## 2018-06-05 NOTE — ED Triage Notes (Addendum)
Pt reports tingling and burning that is traveling up and down legs and arms and even in scalp. This has been occurring 1 week ago. Reports that he was on lyrica and was taken off of it 3 weeks ago due to getting percocet due to back problems also restarted on trintelix for depression 3 weeks ago
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
## 2018-06-08 ENCOUNTER — Other Ambulatory Visit: Payer: Self-pay

## 2018-06-08 ENCOUNTER — Ambulatory Visit (HOSPITAL_COMMUNITY)
Admission: RE | Admit: 2018-06-08 | Discharge: 2018-06-08 | Disposition: A | Discharge status: Home / Self Care | Payer: Medicaid Other | Attending: Psychiatry | Admitting: Psychiatry

## 2018-06-08 ENCOUNTER — Encounter (HOSPITAL_COMMUNITY): Payer: Self-pay

## 2018-06-08 ENCOUNTER — Emergency Department (HOSPITAL_COMMUNITY)
Admission: EM | Admit: 2018-06-08 | Discharge: 2018-06-13 | Disposition: A | Discharge status: Home / Self Care | Payer: Medicaid Other | Attending: Emergency Medicine | Admitting: Emergency Medicine

## 2018-06-08 DIAGNOSIS — Z79899 Other long term (current) drug therapy: Secondary | ICD-10-CM | POA: Insufficient documentation

## 2018-06-08 DIAGNOSIS — F17210 Nicotine dependence, cigarettes, uncomplicated: Secondary | ICD-10-CM | POA: Diagnosis not present

## 2018-06-08 DIAGNOSIS — F918 Other conduct disorders: Secondary | ICD-10-CM | POA: Diagnosis not present

## 2018-06-08 DIAGNOSIS — R4689 Other symptoms and signs involving appearance and behavior: Secondary | ICD-10-CM | POA: Diagnosis not present

## 2018-06-08 DIAGNOSIS — F333 Major depressive disorder, recurrent, severe with psychotic symptoms: Secondary | ICD-10-CM | POA: Diagnosis not present

## 2018-06-08 DIAGNOSIS — R456 Violent behavior: Secondary | ICD-10-CM | POA: Diagnosis not present

## 2018-06-08 DIAGNOSIS — Z046 Encounter for general psychiatric examination, requested by authority: Secondary | ICD-10-CM | POA: Diagnosis present

## 2018-06-08 LAB — CBC WITH DIFFERENTIAL/PLATELET
Abs Immature Granulocytes: 0.03 10*3/uL (ref 0.00–0.07)
Basophils Absolute: 0 10*3/uL (ref 0.0–0.1)
Basophils Relative: 0 %
Eosinophils Absolute: 0.1 10*3/uL (ref 0.0–0.5)
Eosinophils Relative: 1 %
HCT: 48.7 % (ref 39.0–52.0)
Hemoglobin: 15.9 g/dL (ref 13.0–17.0)
Immature Granulocytes: 0 %
Lymphocytes Relative: 36 %
Lymphs Abs: 3.2 10*3/uL (ref 0.7–4.0)
MCH: 29.8 pg (ref 26.0–34.0)
MCHC: 32.6 g/dL (ref 30.0–36.0)
MCV: 91.2 fL (ref 80.0–100.0)
Monocytes Absolute: 0.4 10*3/uL (ref 0.1–1.0)
Monocytes Relative: 5 %
Neutro Abs: 5.1 10*3/uL (ref 1.7–7.7)
Neutrophils Relative %: 58 %
Platelets: 185 10*3/uL (ref 150–400)
RBC: 5.34 MIL/uL (ref 4.22–5.81)
RDW: 12.8 % (ref 11.5–15.5)
WBC: 8.9 10*3/uL (ref 4.0–10.5)
nRBC: 0 % (ref 0.0–0.2)

## 2018-06-08 LAB — RAPID URINE DRUG SCREEN, HOSP PERFORMED
Amphetamines: NOT DETECTED
Barbiturates: NOT DETECTED
Benzodiazepines: POSITIVE — AB
Cocaine: NOT DETECTED
Opiates: POSITIVE — AB
Tetrahydrocannabinol: NOT DETECTED

## 2018-06-08 LAB — COMPREHENSIVE METABOLIC PANEL
ALT: 17 U/L (ref 0–44)
AST: 20 U/L (ref 15–41)
Albumin: 4 g/dL (ref 3.5–5.0)
Alkaline Phosphatase: 52 U/L (ref 38–126)
Anion gap: 11 (ref 5–15)
BUN: 17 mg/dL (ref 6–20)
CO2: 24 mmol/L (ref 22–32)
Calcium: 9.2 mg/dL (ref 8.9–10.3)
Chloride: 108 mmol/L (ref 98–111)
Creatinine, Ser: 0.91 mg/dL (ref 0.61–1.24)
GFR calc Af Amer: >60 mL/min (ref >60)
GFR calc non Af Amer: >60 mL/min (ref >60)
Glucose, Bld: 104 mg/dL — ABNORMAL HIGH (ref 70–99)
Potassium: 4 mmol/L (ref 3.5–5.1)
Sodium: 143 mmol/L (ref 135–145)
Total Bilirubin: 0.5 mg/dL (ref 0.3–1.2)
Total Protein: 6.7 g/dL (ref 6.5–8.1)

## 2018-06-08 LAB — ETHANOL: Alcohol, Ethyl (B): <10 mg/dL (ref <10)

## 2018-06-08 MED ORDER — OXYCODONE-ACETAMINOPHEN 5-325 MG PO TABS
1.0000 | ORAL_TABLET | Freq: Four times a day (QID) | ORAL | Status: DC | PRN
  Administered 2018-06-08 – 2018-06-12 (×12): 1 via ORAL
  Filled 2018-06-08 (×13): qty 1

## 2018-06-08 MED ORDER — ONDANSETRON HCL 4 MG PO TABS: 4.0000 mg | ORAL_TABLET | Freq: Three times a day (TID) | ORAL | Status: DC | PRN

## 2018-06-08 MED ORDER — TEMAZEPAM 15 MG PO CAPS
30.0000 mg | ORAL_CAPSULE | Freq: Every day | ORAL | Status: DC
  Administered 2018-06-08 – 2018-06-12 (×5): 30 mg via ORAL
  Filled 2018-06-08 (×4): qty 2

## 2018-06-08 MED ORDER — OXYCODONE HCL 5 MG PO TABS
5.0000 mg | ORAL_TABLET | Freq: Four times a day (QID) | ORAL | Status: DC | PRN
  Administered 2018-06-12 – 2018-06-13 (×4): 5 mg via ORAL
  Filled 2018-06-08 (×4): qty 1

## 2018-06-08 MED ORDER — PREGABALIN 75 MG PO CAPS
200.0000 mg | ORAL_CAPSULE | Freq: Two times a day (BID) | ORAL | Status: DC
  Administered 2018-06-08 – 2018-06-13 (×10): 200 mg via ORAL
  Filled 2018-06-08 (×10): qty 1

## 2018-06-08 MED ORDER — TRAZODONE HCL 50 MG PO TABS
50.0000 mg | ORAL_TABLET | Freq: Every evening | ORAL | Status: DC | PRN
  Administered 2018-06-09 – 2018-06-11 (×3): 50 mg via ORAL
  Filled 2018-06-08 (×3): qty 1

## 2018-06-08 MED ORDER — ZIPRASIDONE MESYLATE 20 MG IM SOLR
INTRAMUSCULAR | Status: AC
  Filled 2018-06-08: qty 20

## 2018-06-08 MED ORDER — CARBAMAZEPINE 100 MG PO CHEW
50.0000 mg | CHEWABLE_TABLET | Freq: Three times a day (TID) | ORAL | Status: DC
  Administered 2018-06-08 – 2018-06-13 (×14): 50 mg via ORAL
  Filled 2018-06-08 (×27): qty 0.5

## 2018-06-08 MED ORDER — ZIPRASIDONE MESYLATE 20 MG IM SOLR
10.0000 mg | Freq: Once | INTRAMUSCULAR | Status: AC
  Administered 2018-06-08: 10 mg via INTRAMUSCULAR

## 2018-06-08 MED ORDER — PANTOPRAZOLE SODIUM 40 MG PO TBEC
40.0000 mg | DELAYED_RELEASE_TABLET | Freq: Every day | ORAL | Status: DC
  Administered 2018-06-08 – 2018-06-13 (×6): 40 mg via ORAL
  Filled 2018-06-08 (×6): qty 1

## 2018-06-08 MED ORDER — STERILE WATER FOR INJECTION IJ SOLN
INTRAMUSCULAR | Status: AC
  Filled 2018-06-08: qty 10
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
## 2018-06-08 NOTE — ED Notes (Addendum)
Pt asleep with even and non labored respirations at present
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
## 2018-06-08 NOTE — BH Assessment (Signed)
[facility] Assessment Progress Note   Clinician informed by nurse Baldo Ash that patient had been given geodon around 23:00.  Pt unable to be assessed at this time.  TTS to see patient when he is alert and oriented.
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
## 2018-06-08 NOTE — ED Notes (Signed)
Pt started beating on the garage door in the room, pt yelling "I shouldn't even be here" pt continued to beat the garage door, kicked the bedside table over.  Pt upset also due to wait for [facility] evaluation.

## 2018-06-08 NOTE — ED Notes (Signed)
Pt cussing, yelling at staff- attempted to redirect and talk with pt- attempt unsuccessful, pt continues to yell, cuss, becoming more agitated-  security called,[employer] called-Dr Rogene Houston notified- new orders received.

## 2018-06-08 NOTE — ED Notes (Signed)
Benjamin [name] pt's mother [phone] Reported that mother and father are scared of pt coming back and requests to be notified when pt leaves, mother does not want pt back at home due to pt's behavior.  Also reported that mother willing to talk with [facility] concerning pt's behavior as well.

## 2018-06-08 NOTE — ED Notes (Signed)
Pt was smoking a cigarette in room, security called and had cigarettes and lighter removed from room and pt

## 2018-06-08 NOTE — ED Notes (Signed)
Security wanded pt ?

## 2018-06-08 NOTE — ED Notes (Signed)
security and [employer] called at bedside

## 2018-06-09 MED ORDER — ZIPRASIDONE MESYLATE 20 MG IM SOLR: 20.0000 mg | Freq: Once | INTRAMUSCULAR | Status: DC

## 2018-06-09 MED ORDER — ZIPRASIDONE MESYLATE 20 MG IM SOLR
20.0000 mg | Freq: Once | INTRAMUSCULAR | Status: AC
  Administered 2018-06-09: 20 mg via INTRAMUSCULAR
  Filled 2018-06-09: qty 20

## 2018-06-09 NOTE — ED Notes (Signed)
Call light noted to be out from under garage door. Pt asleep at this time. Sitter notified to watch closely. Will remove when patient wakes up.

## 2018-06-09 NOTE — Progress Notes (Signed)
This patient continues to meet inpatient criteria. CSW faxed information to the following facilities:   [facility]  Patient is under IVC. If accepted to a facility, he will require sheriff's transportation.  Stephanie Acre, [place] Social Worker [phone]

## 2018-06-09 NOTE — ED Notes (Signed)
Patient sit on the floor RN notified

## 2018-06-09 NOTE — BHH Counselor (Signed)
  [place] ASSESSMENT DISPOSITION  [facility] discussed case with [place] provider, Mordecai Maes, NP who recommends inpatient treatment. TTS will look for inpatient placement.  Truxton Stupka L. [place], [place], [facility], [facility] Therapeutic Triage Specialist  [phone]
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
## 2018-06-10 ENCOUNTER — Encounter (HOSPITAL_COMMUNITY): Payer: Self-pay | Admitting: Registered Nurse

## 2018-06-10 MED ORDER — NICOTINE 21 MG/24HR TD PT24
21.0000 mg | MEDICATED_PATCH | Freq: Once | TRANSDERMAL | Status: AC
  Administered 2018-06-10: 15:00:00 21 mg via TRANSDERMAL
  Filled 2018-06-10: qty 1

## 2018-06-10 MED ORDER — ZIPRASIDONE MESYLATE 20 MG IM SOLR
20.0000 mg | Freq: Once | INTRAMUSCULAR | Status: AC
  Administered 2018-06-10: 20 mg via INTRAMUSCULAR
  Filled 2018-06-10: qty 20
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
## 2018-06-10 NOTE — ED Notes (Signed)
Pt provided phone for final 3rd phone call of the day.

## 2018-06-10 NOTE — Progress Notes (Signed)
After conferring with patient's family, who report that patient has been acting in a threatening and bizarre manner and were very upset, it was decided to keep patient under IVC and seek placement for him at [facility].   Areatha Keas. Judi Cong, MSW, [place] Disposition Clinical Social Work [phone] (cell) [phone] (office)
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
## 2018-06-11 ENCOUNTER — Encounter (HOSPITAL_COMMUNITY): Payer: Self-pay | Admitting: Emergency Medicine

## 2018-06-11 MED ORDER — ZIPRASIDONE MESYLATE 20 MG IM SOLR
20.0000 mg | Freq: Once | INTRAMUSCULAR | Status: AC
  Administered 2018-06-11: 20 mg via INTRAMUSCULAR

## 2018-06-11 MED ORDER — ZOLPIDEM TARTRATE 5 MG PO TABS
10.0000 mg | ORAL_TABLET | Freq: Once | ORAL | Status: AC
  Administered 2018-06-11: 10 mg via ORAL

## 2018-06-11 MED ORDER — ZIPRASIDONE MESYLATE 20 MG IM SOLR
INTRAMUSCULAR | Status: AC
  Filled 2018-06-11: qty 20

## 2018-06-11 MED ORDER — ZOLPIDEM TARTRATE 5 MG PO TABS
ORAL_TABLET | ORAL | Status: AC
  Filled 2018-06-11: qty 2

## 2018-06-11 NOTE — Progress Notes (Signed)
CSW contacted referral facilities with the following results:  Still reviewing: [facility] (resent per request) [facility] (resent per request) [facility] (voicemail left) [facility] (still reviewing) [facility] (voicemail left) [facility] (voicemail left) [facility]  Declined: [facility] (waitlisted due to no high acuity bed today) [facility] (no high acuity bed today)  TTS will continue to seek placement.  Chalmers Guest. Guerry Bruin, MSW, [place] Work/Disposition Phone: [phone] Fax: [phone]

## 2018-06-11 NOTE — ED Notes (Signed)
Call to pharm re: late med  Second call

## 2018-06-11 NOTE — ED Notes (Signed)
Pt agitated and asking why he is here   He is advised that he is under an involuntary commitment  He berates the nurse and demands an "update"   Reality therapy [name])- pt reminded that nursing staff trying to help That his chosen behaviors resulted in his being in the department That he would be reevaluated by [facility] and could discuss his [place] concerns with them  Meanwhile we would keep him safe while [facility] attempted placement

## 2018-06-11 NOTE — ED Notes (Signed)
Out of bed to br

## 2018-06-11 NOTE — ED Notes (Signed)
Out of bed to bathroom 

## 2018-06-11 NOTE — ED Notes (Signed)
Mother called to check on pt.  Mother aware pt still in ED waiting for bed.  Pt sleeping at this time.

## 2018-06-11 NOTE — ED Notes (Signed)
Pt to desk shouting and threatening staff   Laid upon the floor and would not get up   Security and physician attempts to calm patient unsuccessful  Pt to room and geodon given   Pt manipulative behaviors in trying to bargain using the phone again - lights dimmed security and sitter encouraged to distance from pt to decrease his opportunity to verbalize his discontent

## 2018-06-11 NOTE — ED Notes (Signed)
Pt shouting  Fritz Pickerel, Security, in to request pt lower his tone of voice

## 2018-06-11 NOTE — ED Notes (Signed)
Pt given night meds  They are delivered and pt reports "where is my percocet?" Attempt to explain that percocet is a as needed order and pt needs to report pain to RN to receive meds   "I am always in pain" pt retorts   Reality therapy with recommendation that pt dry to rest to reset his dopamine receptors, raise his serotonin, and allow others to rest in rooms around home\[phone]\

## 2018-06-11 NOTE — ED Notes (Signed)
Pt assisted to bed by staff and security  Pt told he was going to be given med for assisting his behaviors and calming  Patient elected to allow injection and has been given med  Call to [facility] placement to inquire as to placement- Informed that pt has notbeen submitted today as poc has been "trying to place the new patients" - asked if this patient who is disruptive to this department will be attempted to place today - was assured that pt will be   Security x 2, [employer] x 2 encouraged to leave and will call if need be

## 2018-06-11 NOTE — ED Notes (Signed)
Pt continues asleep

## 2018-06-11 NOTE — ED Notes (Signed)
Pt request pain meds for back pain   Reports pain 11/10  He is provided PRN per request

## 2018-06-11 NOTE — ED Notes (Signed)
Pt continues sleeping with even, unlabored respirations

## 2018-06-11 NOTE — ED Notes (Signed)
The behavioral disturbance in rm 15 awakened pt in 17 and he was encourager to try to rest   He asks for sprite and is denied with the explaination that

## 2018-06-11 NOTE — ED Notes (Signed)
Pt has had three phone calls

## 2018-06-11 NOTE — ED Notes (Signed)
Pt shouting regarding being in ED   Security called   Pt encouraged to try to control his current behaviors   He is demanding the phone- says his mother is responsible for his being here He is encouraged to stay in room, eat his lunch and he can use phone after his lunch

## 2018-06-11 NOTE — Progress Notes (Signed)
This sitter gave patient zero spirit and phone call per request.

## 2018-06-11 NOTE — ED Notes (Signed)
Pt at door attempting to gain attention by speaking loudly and looking to see if he is having an impact with his behaviors.  He request to all who pass by his door and wil speak with him to explain his IVC and how it is a mistake and he wants to go home and "they will come pick me up".  His lights are dimmed and pt  Is encouraged to rest quietly

## 2018-06-11 NOTE — ED Notes (Signed)
Pt continues manipulative vehaviors asking security guard to explain IVC  It has been explained to him several times   Pt standing in doorway of his room talking loudly

## 2018-06-11 NOTE — ED Notes (Addendum)
Pt awakened for his tegretol, meds given and pt resumed rest with eyes closed and even, unlabored respirations  Per Magda Paganini, CN, pt mother called and reported that earlier today pt reported that he is not being given his pain meds- pt at no time during this RN's care has reported that he was in pain, mild, moderate, or severe- Magda Paganini CN informed- Sitter asked regarding report of pain and she denies she has been informed of pain from pt

## 2018-06-11 NOTE — ED Notes (Signed)
Pt complains of back pain   Request pain meds   provided

## 2018-06-11 NOTE — ED Notes (Signed)
Meds provided for pt by pharmacy   Pharm contacted x 3 for meds - med due at 1000

## 2018-06-11 NOTE — ED Notes (Signed)
Pt resting, eyes closed, with even, unlabored respirations

## 2018-06-11 NOTE — ED Notes (Signed)
Pt behaved well throughout evening-shift 7pm-7am and listened, cooperated and did not bother any other staff or patients. Pt requests if he is sleeping and his medications are due, to please wake him up so he does not miss any scheduled medications and can stay on track with his medications.

## 2018-06-12 MED ORDER — NICOTINE 21 MG/24HR TD PT24
21.0000 mg | MEDICATED_PATCH | Freq: Every day | TRANSDERMAL | Status: DC
  Administered 2018-06-12 – 2018-06-13 (×3): 21 mg via TRANSDERMAL
  Filled 2018-06-12 (×3): qty 1

## 2018-06-12 NOTE — ED Notes (Signed)
Pt awake, cooperative at present,

## 2018-06-12 NOTE — Progress Notes (Signed)
CSW contacted referral facilities with the following results:  Still reviewing: [facility] (voicemail left) [facility] (resent per request) [facility] (voicemail left) [facility] (try tomorrow)  Declined: [facility] (at capacity) [facility] (at capacity) [facility] (too aggressive) [facility] (at capacity but keep for review later) [facility] (waitlisted due to high acuity) [facility] (no high acuity beds currently)  TTS will continue to seek placement.   Chalmers Guest. Guerry Bruin, MSW, [place] Work/Disposition Phone: [phone] Fax: [phone]

## 2018-06-12 NOTE — ED Notes (Signed)
Pt moved to rm 8 dues to escalation of behaviors of holding patients for [facility]  Pt removed from area to decrease stimuli as well as to facilitate rest

## 2018-06-12 NOTE — ED Notes (Signed)
Pt requesting nicotine patch, patch given per orders, states that the old one fell off, one patch removed from room and disposed off, pt continues to ramble in conversation, instruction to attempt to get some sleep, sitter remains at bedside,

## 2018-06-12 NOTE — ED Notes (Signed)
Pt has been quiet and cooperative since being moved to rm 8   He has attempted to manipulate staff by staff splitting, requesting pain meds prior to admission times  Behavioral boundaries have been maintained by both sitter and RN and pt has remained calm and cooperative

## 2018-06-12 NOTE — ED Notes (Addendum)
Pt upset with being in the department, states " I will be able to leave tomorrow" process explained to pt that he was IVC"d and that a lot of his care would depend on his behavior, pt states " I should not have done that earlier but I was mad" comfort measure provided, pt expressed understanding, sitter remains at bedside,
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
## 2018-06-12 NOTE — ED Notes (Signed)
Pt starting to get loud regarding his home medications. States he is supposed to get Lyrica three times daily and not 2. Pt began yelling "this is fucking bullshit, ya'll wrote the prescription here and don't even know what it says."  Review of prior prescriptions shows twice daily was written. This explained to patient along with the fact his behavior would not be tolerated and I would work with him if he would work with me.  Pt deescalated and apologized.

## 2018-06-13 DIAGNOSIS — R456 Violent behavior: Secondary | ICD-10-CM

## 2018-06-13 DIAGNOSIS — F918 Other conduct disorders: Secondary | ICD-10-CM

## 2018-06-13 DIAGNOSIS — R4689 Other symptoms and signs involving appearance and behavior: Secondary | ICD-10-CM

## 2018-06-13 MED ORDER — TRAZODONE HCL 50 MG PO TABS: 50.0000 mg | ORAL_TABLET | Freq: Every evening | ORAL | 0 refills | Status: DC | PRN

## 2018-06-13 MED ORDER — PREGABALIN 200 MG PO CAPS: 200.0000 mg | ORAL_CAPSULE | Freq: Two times a day (BID) | ORAL | 1 refills | Status: DC

## 2018-06-13 MED ORDER — CARBAMAZEPINE 100 MG PO CHEW: 50.0000 mg | CHEWABLE_TABLET | Freq: Three times a day (TID) | ORAL | 0 refills | Status: DC

## 2018-06-13 MED ORDER — VORTIOXETINE HBR 10 MG PO TABS: 10.0000 mg | ORAL_TABLET | Freq: Every day | ORAL | 0 refills | Status: DC

## 2018-06-13 NOTE — Discharge Instructions (Signed)
Continue medications as previously prescribed.  Follow-up with outpatient counseling as recommended by TTS.
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
## 2018-06-13 NOTE — ED Provider Notes (Signed)
Patient cleared by TTS for discharge.   Veryl Speak, MD 06/13/18 1119

## 2018-06-13 NOTE — ED Notes (Signed)
Pt given breakfast tray

## 2018-10-25 ENCOUNTER — Telehealth: Payer: Self-pay | Admitting: Internal Medicine

## 2018-10-25 ENCOUNTER — Ambulatory Visit: Payer: Self-pay | Admitting: Gastroenterology

## 2018-10-25 ENCOUNTER — Encounter: Payer: Self-pay | Admitting: Internal Medicine

## 2018-10-25 NOTE — Telephone Encounter (Signed)
Patient was a no show and letter sent  °

## 2018-10-25 NOTE — Progress Notes (Deleted)
Primary Care Physician: Patient, No Pcp Per  Primary Gastroenterologist:  Garfield Cornea, MD   No chief complaint on file.   HPI: Benjamin [name] is a 48 y.o. male here for follow-up.  He had a virtual visit in April 2020.  Chronic GERD and constipation.  History of chronic abdominal pain, EGD in October 2019 unremarkable.  Up-to-date on colonoscopy, last performed October 2019, 2 benign polyps removed.  Next colonoscopy planned for October 2024 due to prior history of adenomatous colon polyps.  History of UDS in December 2019+ for cocaine, THC, opiates.  At time of last office visit we started him on Amitiza 24 mcg 1-2 times daily.  Advised to continue Dexilant 60 mg daily.  Current Outpatient Medications  Medication Sig Dispense Refill  . albuterol (PROVENTIL HFA;VENTOLIN HFA) 108 (90 Base) MCG/ACT inhaler Inhale 1-2 puffs into the lungs every 6 (six) hours as needed for wheezing or shortness of breath.    . carbamazepine (TEGRETOL) 100 MG chewable tablet Chew 0.5 tablets (50 mg total) by mouth 3 (three) times daily. For mood stabilization 10 tablet 0  . dexlansoprazole (DEXILANT) 60 MG capsule Take 1 capsule (60 mg total) by mouth daily. 30 capsule 11  . doxycycline (VIBRAMYCIN) 100 MG capsule Take 1 capsule (100 mg total) by mouth 2 (two) times daily. 20 capsule 0  . lidocaine (LIDODERM) 5 % Place 1 patch onto the skin daily. Remove & Discard patch within 12 hours or as directed by MD: For pain management 30 patch 0  . lubiprostone (AMITIZA) 24 MCG capsule Take 1 cap 1-2 times daily with food for constipation. (Patient taking differently: Take 24-48 mcg by mouth See admin instructions. Take 1 cap 1-2 times daily with food for constipation.) 60 capsule 5  . Multiple Vitamin (MULTIVITAMIN WITH MINERALS) TABS tablet Take 1 tablet by mouth daily. (May buy from over the counter): Vitamin supplementation    . pregabalin (LYRICA) 200 MG capsule Take 1 capsule (200 mg total) by mouth 2 (two)  times daily. 20 capsule 1  . temazepam (RESTORIL) 30 MG capsule Take 1 capsule (30 mg total) by mouth at bedtime. For sleep 15 capsule 0  . traZODone (DESYREL) 50 MG tablet Take 1 tablet (50 mg total) by mouth at bedtime as needed for sleep. 7 tablet 0  . umeclidinium-vilanterol (ANORO ELLIPTA) 62.5-25 MCG/INH AEPB Inhale 1 puff into the lungs daily.    Marland Kitchen vortioxetine HBr (TRINTELLIX) 10 MG TABS tablet Take 1 tablet (10 mg total) by mouth daily. 7 tablet 0   No current facility-administered medications for this visit.     Allergies as of 10/25/2018  . (No Known Allergies)    ROS:  General: Negative for anorexia, weight loss, fever, chills, fatigue, weakness. ENT: Negative for hoarseness, difficulty swallowing , nasal congestion. CV: Negative for chest pain, angina, palpitations, dyspnea on exertion, peripheral edema.  Respiratory: Negative for dyspnea at rest, dyspnea on exertion, cough, sputum, wheezing.  GI: See history of present illness. GU:  Negative for dysuria, hematuria, urinary incontinence, urinary frequency, nocturnal urination.  Endo: Negative for unusual weight change.    Physical Examination:   There were no vitals taken for this visit.  General: Well-nourished, well-developed in no acute distress.  Eyes: No icterus. Mouth: Oropharyngeal mucosa moist and pink , no lesions erythema or exudate. Lungs: Clear to auscultation bilaterally.  Heart: Regular rate and rhythm, no murmurs rubs or gallops.  Abdomen: Bowel sounds are normal, nontender, nondistended, no hepatosplenomegaly  or masses, no abdominal bruits or hernia , no rebound or guarding.   Extremities: No lower extremity edema. No clubbing or deformities. Neuro: Alert and oriented x 4   Skin: Warm and dry, no jaundice.   Psych: Alert and cooperative, normal mood and affect.  Labs:  ***  Imaging Studies: No results found.

## 2018-11-12 ENCOUNTER — Emergency Department (HOSPITAL_COMMUNITY)
Admission: EM | Admit: 2018-11-12 | Discharge: 2018-11-12 | Disposition: A | Discharge status: Home / Self Care | Payer: Medicaid Other | Attending: Emergency Medicine | Admitting: Emergency Medicine

## 2018-11-12 ENCOUNTER — Other Ambulatory Visit: Payer: Self-pay

## 2018-11-12 ENCOUNTER — Encounter (HOSPITAL_COMMUNITY): Payer: Self-pay

## 2018-11-12 DIAGNOSIS — R109 Unspecified abdominal pain: Secondary | ICD-10-CM | POA: Insufficient documentation

## 2018-11-12 DIAGNOSIS — Z5321 Procedure and treatment not carried out due to patient leaving prior to being seen by health care provider: Secondary | ICD-10-CM | POA: Insufficient documentation

## 2018-11-12 NOTE — ED Triage Notes (Signed)
Pt reports abdominal pain and "sweating" that started yesterday. Pt reports chills now. Reports having loose stool yesterday. Pt reports having ulcers in the past.

## 2018-11-26 ENCOUNTER — Other Ambulatory Visit: Payer: Self-pay | Admitting: Gastroenterology

## 2018-11-29 ENCOUNTER — Telehealth: Payer: Self-pay | Admitting: Internal Medicine

## 2018-11-29 ENCOUNTER — Encounter: Payer: Self-pay | Admitting: Internal Medicine

## 2018-11-29 ENCOUNTER — Ambulatory Visit: Payer: Medicaid Other | Admitting: Gastroenterology

## 2018-11-29 NOTE — Telephone Encounter (Signed)
Looks like patient had 1 years worth of Dexilant prescribed at visit in April 2020.

## 2018-11-29 NOTE — Telephone Encounter (Signed)
Patient was a no show and letter sent  °

## 2018-11-30 NOTE — Telephone Encounter (Signed)
Noted  

## 2018-12-01 ENCOUNTER — Other Ambulatory Visit: Payer: Self-pay | Admitting: Gastroenterology

## 2018-12-20 ENCOUNTER — Other Ambulatory Visit: Payer: Self-pay | Admitting: Gastroenterology

## 2018-12-21 ENCOUNTER — Encounter: Payer: Self-pay | Admitting: Gastroenterology

## 2018-12-21 ENCOUNTER — Other Ambulatory Visit: Payer: Self-pay

## 2018-12-21 ENCOUNTER — Ambulatory Visit: Payer: Medicaid Other | Admitting: Gastroenterology

## 2018-12-21 VITALS — BP 137/86 | HR 88 | Temp 96.9°F | Ht 67.0 in | Wt 147.4 lb

## 2018-12-21 DIAGNOSIS — K219 Gastro-esophageal reflux disease without esophagitis: Secondary | ICD-10-CM | POA: Diagnosis not present

## 2018-12-21 DIAGNOSIS — R143 Flatulence: Secondary | ICD-10-CM | POA: Diagnosis not present

## 2018-12-21 NOTE — Patient Instructions (Addendum)
1. Continue Dexilant once daily before breakfast.  2. We did not have any samples of probiotics. I would recommend [place] for one month. Per label instructions. No prescription required.  3. Avoid foods on high fodmap list. They produce more gas.  4. Return to the office in six months or call sooner if needed.    Low-FODMAP Eating Plan  FODMAPs (fermentable oligosaccharides, disaccharides, monosaccharides, and polyols) are sugars that are hard for some people to digest. A low-FODMAP eating plan may help some people who have bowel (intestinal) diseases to manage their symptoms. This meal plan can be complicated to follow. Work with a diet and nutrition specialist (dietitian) to make a low-FODMAP eating plan that is right for you. A dietitian can make sure that you get enough nutrition from this diet. What are tips for following this plan? Reading food labels  Check labels for hidden FODMAPs such as: ? High-fructose syrup. ? Honey. ? Agave. ? Natural fruit flavors. ? Onion or garlic powder.  Choose low-FODMAP foods that contain 3-4 grams of fiber per serving.  Check food labels for serving sizes. Eat only one serving at a time to make sure FODMAP levels stay low. Meal planning  Follow a low-FODMAP eating plan for up to 6 weeks, or as told by your health care provider or dietitian.  To follow the eating plan: 1. Eliminate high-FODMAP foods from your diet completely. 2. Gradually reintroduce high-FODMAP foods into your diet one at a time. Most people should wait a few days after introducing one high-FODMAP food before they introduce the next high-FODMAP food. Your dietitian can recommend how quickly you may reintroduce foods. 3. Keep a daily record of what you eat and drink, and make note of any symptoms that you have after eating. 4. Review your daily record with a dietitian regularly. Your dietitian can help you identify which foods you can eat and which foods you should  avoid. General tips  Drink enough fluid each day to keep your urine pale yellow.  Avoid processed foods. These often have added sugar and may be high in FODMAPs.  Avoid most dairy products, whole grains, and sweeteners.  Work with a dietitian to make sure you get enough fiber in your diet. Recommended foods Grains  Gluten-free grains, such as rice, oats, buckwheat, quinoa, corn, polenta, and millet. Gluten-free pasta, bread, or cereal. Rice noodles. Corn tortillas. Vegetables  Eggplant, zucchini, cucumber, peppers, green beans, Brussels sprouts, bean sprouts, lettuce, arugula, kale, Swiss chard, spinach, collard greens, bok choy, summer squash, potato, and tomato. Limited amounts of corn, carrot, and sweet potato. Green parts of scallions. Fruits  Bananas, oranges, lemons, limes, blueberries, raspberries, strawberries, grapes, cantaloupe, honeydew melon, kiwi, papaya, passion fruit, and pineapple. Limited amounts of dried cranberries, banana chips, and shredded coconut. Dairy  Lactose-free milk, yogurt, and kefir. Lactose-free cottage cheese and ice cream. Non-dairy milks, such as almond, coconut, hemp, and rice milk. Yogurts made of non-dairy milks. Limited amounts of goat cheese, brie, mozzarella, parmesan, swiss, and other hard cheeses. Meats and other protein foods  Unseasoned beef, pork, poultry, or fish. Eggs. Berniece Salines. Tofu (firm) and tempeh. Limited amounts of nuts and seeds, such as almonds, walnuts, [place] nuts, pecans, peanuts, pumpkin seeds, chia seeds, and sunflower seeds. Fats and oils  Butter-free spreads. Vegetable oils, such as olive, canola, and sunflower oil. Seasoning and other foods  Artificial sweeteners with names that do not end in "ol" such as aspartame, saccharine, and stevia. Maple syrup, white table sugar,  raw sugar, brown sugar, and molasses. Fresh basil, coriander, parsley, rosemary, and thyme. Beverages  Water and mineral water. Sugar-sweetened soft  drinks. Small amounts of orange juice or cranberry juice. Black and green tea. Most dry wines. Coffee. This may not be a complete list of low-FODMAP foods. Talk with your dietitian for more information. Foods to avoid Grains  Wheat, including kamut, durum, and semolina. Barley and bulgur. Couscous. Wheat-based cereals. Wheat noodles, bread, crackers, and pastries. Vegetables  Chicory root, artichoke, asparagus, cabbage, snow peas, sugar snap peas, mushrooms, and cauliflower. Onions, garlic, leeks, and the white part of scallions. Fruits  Fresh, dried, and juiced forms of apple, pear, watermelon, peach, plum, cherries, apricots, blackberries, boysenberries, figs, nectarines, and mango. Avocado. Dairy  Milk, yogurt, ice cream, and soft cheese. Cream and sour cream. Milk-based sauces. Custard. Meats and other protein foods  Fried or fatty meat. Sausage. Cashews and pistachios. Soybeans, baked beans, black beans, chickpeas, kidney beans, fava beans, navy beans, lentils, and split peas. Seasoning and other foods  Any sugar-free gum or candy. Foods that contain artificial sweeteners such as sorbitol, mannitol, isomalt, or xylitol. Foods that contain honey, high-fructose corn syrup, or agave. Bouillon, vegetable stock, beef stock, and chicken stock. Garlic and onion powder. Condiments made with onion, such as hummus, chutney, pickles, relish, salad dressing, and salsa. Tomato paste. Beverages  Chicory-based drinks. Coffee substitutes. Chamomile tea. Fennel tea. Sweet or fortified wines such as port or sherry. Diet soft drinks made with isomalt, mannitol, maltitol, sorbitol, or xylitol. Apple, pear, and mango juice. Juices with high-fructose corn syrup. This may not be a complete list of high-FODMAP foods. Talk with your dietitian to discuss what dietary choices are best for you.  Summary  A low-FODMAP eating plan is a short-term diet that eliminates FODMAPs from your diet to help ease symptoms of  certain bowel diseases.  The eating plan usually lasts up to 6 weeks. After that, high-FODMAP foods are restarted gradually, one at a time, so you can find out which may be causing symptoms.  A low-FODMAP eating plan can be complicated. It is best to work with a dietitian who has experience with this type of plan. This information is not intended to replace advice given to you by your health care provider. Make sure you discuss any questions you have with your health care provider. Document Released: 09/01/2016 Document Revised: 12/18/2016 Document Reviewed: 09/01/2016 Elsevier Patient Education  2020 [employer].

## 2018-12-21 NOTE — Assessment & Plan Note (Signed)
Doing well on Dexilant.  Reinforced antireflux measures.

## 2018-12-21 NOTE — Assessment & Plan Note (Signed)
Complains of excessive flatulence.  No associated abdominal pain or diarrhea.  Avoid foods on the high FODMAP diet.  Start probiotic such as Hardin Negus colon health.  Call with any persisting concerns.  Otherwise see you back in 6 months.

## 2018-12-21 NOTE — Progress Notes (Signed)
Primary Care Physician: Patient, No Pcp Per  Primary Gastroenterologist:  Garfield Cornea, MD   Chief Complaint  Patient presents with  . Gastroesophageal Reflux    had been out of Dexilant but got refill yesterday    HPI: Benjamin [name] is a 48 y.o. male here for follow-up.  He no showed his last 2 appointments.  He was last seen in April during a phone visit.  He has a history of ulcerative esophagitis, duodenal ulcers in 2014.  EGD back in October 2019 showed small hiatal hernia but otherwise unremarkable exam.  Colonoscopy at same time showed 2 benign polyps but given history of prior adenomatous colon polyps he was advised to have another colonoscopy in 5 years.  Patient admitted in January 2020 with intentional baclofen overdose.  Weight has fluctuated anywhere from 150 to 200 pounds in the past couple of years.  150 pounds in November 2018.  Weight 205 pounds in October 2019.  Down to 160 pounds in May 2020.  150 pounds on October 24.  147 pounds today.  Patient contributes his weight loss to his illness back in January as well as cutting back on sugary drinks.  His concern is that of chronic back pain.  He was declined disability which is frustrating to him.  He states he has nothing to take for his back pain.  In the mornings he has no appetite because of his pain.  Generally starts eating later in the day.  No nausea or vomiting.  Heartburn well managed on Dexilant.  He was out of medication for about a week and had terrible reflux which is not managed with over-the-counter antacids.  Bowels are moving fairly good.  Rarely takes Amitiza.  No melena or rectal bleeding.  A lot of gas.    Current Outpatient Medications  Medication Sig Dispense Refill  . albuterol (PROVENTIL HFA;VENTOLIN HFA) 108 (90 Base) MCG/ACT inhaler Inhale 1-2 puffs into the lungs every 6 (six) hours as needed for wheezing or shortness of breath.    . carbamazepine (TEGRETOL) 100 MG chewable tablet Chew  0.5 tablets (50 mg total) by mouth 3 (three) times daily. For mood stabilization 10 tablet 0  . DEXILANT 60 MG capsule TAKE ONE CAPSULE BY MOUTH ONCE DAILY. 30 capsule 5  . lidocaine (LIDODERM) 5 % Place 1 patch onto the skin daily. Remove & Discard patch within 12 hours or as directed by MD: For pain management 30 patch 0  . lubiprostone (AMITIZA) 24 MCG capsule Take 1 cap 1-2 times daily with food for constipation. (Patient taking differently: Take 24-48 mcg by mouth See admin instructions. Take 1 cap 1-2 times daily with food for constipation.) 60 capsule 5  . Multiple Vitamin (MULTIVITAMIN WITH MINERALS) TABS tablet Take 1 tablet by mouth daily. (May buy from over the counter): Vitamin supplementation    . pregabalin (LYRICA) 200 MG capsule Take 1 capsule (200 mg total) by mouth 2 (two) times daily. 20 capsule 1  . temazepam (RESTORIL) 30 MG capsule Take 1 capsule (30 mg total) by mouth at bedtime. For sleep (Patient taking differently: Take 30 mg by mouth at bedtime as needed. For sleep) 15 capsule 0  . traZODone (DESYREL) 50 MG tablet Take 1 tablet (50 mg total) by mouth at bedtime as needed for sleep. 7 tablet 0  . umeclidinium-vilanterol (ANORO ELLIPTA) 62.5-25 MCG/INH AEPB Inhale 1 puff into the lungs daily.    Marland Kitchen vortioxetine HBr (TRINTELLIX) 10 MG TABS tablet  Take 1 tablet (10 mg total) by mouth daily. 7 tablet 0   No current facility-administered medications for this visit.     Allergies as of 12/21/2018  . (No Known Allergies)    ROS:  General: Negative for anorexia, fever, chills, fatigue, weakness.  See HPI ENT: Negative for hoarseness, difficulty swallowing , nasal congestion. CV: Negative for chest pain, angina, palpitations, dyspnea on exertion, peripheral edema.  Respiratory: Negative for dyspnea at rest, dyspnea on exertion, cough, sputum, wheezing.  GI: See history of present illness. GU:  Negative for dysuria, hematuria, urinary incontinence, urinary frequency, nocturnal  urination.  Endo: Negative for unusual weight change.    Physical Examination:   BP 137/86   Pulse 88   Temp (!) 96.9 F (36.1 C) (Temporal)   Ht 5\' 7"  (1.702 m)   Wt 147 lb 6.4 oz (66.9 kg)   BMI 23.09 kg/m   General: Well-nourished, well-developed in no acute distress.  Eyes: No icterus. Mouth: Oropharyngeal mucosa moist and pink , no lesions erythema or exudate. Lungs: Clear to auscultation bilaterally.  Heart: Regular rate and rhythm, no murmurs rubs or gallops.  Abdomen: Bowel sounds are normal, nontender, nondistended, no hepatosplenomegaly or masses, no abdominal bruits or hernia , no rebound or guarding.  Patient examined while in the chair, refused to get on the exam table. Extremities: No lower extremity edema. No clubbing or deformities. Neuro: Alert and oriented x 4   Skin: Warm and dry, no jaundice.   Psych: Alert and cooperative, normal mood and affect.  Labs:  Lab Results  Component Value Date   CREATININE 0.91 06/08/2018   BUN 17 06/08/2018   NA 143 06/08/2018   K 4.0 06/08/2018   CL 108 06/08/2018   CO2 24 06/08/2018   Lab Results  Component Value Date   ALT 17 06/08/2018   AST 20 06/08/2018   ALKPHOS 52 06/08/2018   BILITOT 0.5 06/08/2018   Lab Results  Component Value Date   WBC 8.9 06/08/2018   HGB 15.9 06/08/2018   HCT 48.7 06/08/2018   MCV 91.2 06/08/2018   PLT 185 06/08/2018     Imaging Studies: No results found.

## 2018-12-21 NOTE — Progress Notes (Signed)
NO PCP PER PATIENT °

## 2019-04-27 ENCOUNTER — Encounter (HOSPITAL_COMMUNITY): Payer: Self-pay | Admitting: *Deleted

## 2019-04-27 ENCOUNTER — Emergency Department (HOSPITAL_COMMUNITY)
Admission: EM | Admit: 2019-04-27 | Discharge: 2019-04-27 | Disposition: A | Discharge status: Home / Self Care | Payer: Medicaid Other | Attending: Emergency Medicine | Admitting: Emergency Medicine

## 2019-04-27 ENCOUNTER — Other Ambulatory Visit: Payer: Self-pay

## 2019-04-27 DIAGNOSIS — F17210 Nicotine dependence, cigarettes, uncomplicated: Secondary | ICD-10-CM | POA: Insufficient documentation

## 2019-04-27 DIAGNOSIS — Z79899 Other long term (current) drug therapy: Secondary | ICD-10-CM | POA: Diagnosis not present

## 2019-04-27 DIAGNOSIS — F22 Delusional disorders: Secondary | ICD-10-CM | POA: Diagnosis not present

## 2019-04-27 DIAGNOSIS — I10 Essential (primary) hypertension: Secondary | ICD-10-CM | POA: Diagnosis not present

## 2019-04-27 DIAGNOSIS — R079 Chest pain, unspecified: Secondary | ICD-10-CM | POA: Diagnosis not present

## 2019-04-27 LAB — CBC
HCT: 51.6 % (ref 39.0–52.0)
Hemoglobin: 16.8 g/dL (ref 13.0–17.0)
MCH: 29.8 pg (ref 26.0–34.0)
MCHC: 32.6 g/dL (ref 30.0–36.0)
MCV: 91.7 fL (ref 80.0–100.0)
Platelets: 309 10*3/uL (ref 150–400)
RBC: 5.63 MIL/uL (ref 4.22–5.81)
RDW: 12.8 % (ref 11.5–15.5)
WBC: 11.7 10*3/uL — ABNORMAL HIGH (ref 4.0–10.5)
nRBC: 0 % (ref 0.0–0.2)

## 2019-04-27 LAB — BASIC METABOLIC PANEL
Anion gap: 11 (ref 5–15)
BUN: 8 mg/dL (ref 6–20)
CO2: 21 mmol/L — ABNORMAL LOW (ref 22–32)
Calcium: 9.4 mg/dL (ref 8.9–10.3)
Chloride: 106 mmol/L (ref 98–111)
Creatinine, Ser: 1.23 mg/dL (ref 0.61–1.24)
GFR calc Af Amer: >60 mL/min (ref >60)
GFR calc non Af Amer: >60 mL/min (ref >60)
Glucose, Bld: 150 mg/dL — ABNORMAL HIGH (ref 70–99)
Potassium: 3.3 mmol/L — ABNORMAL LOW (ref 3.5–5.1)
Sodium: 138 mmol/L (ref 135–145)

## 2019-04-27 LAB — TROPONIN I (HIGH SENSITIVITY): Troponin I (High Sensitivity): 8 ng/L (ref <18)

## 2019-04-27 LAB — ETHANOL: Alcohol, Ethyl (B): <10 mg/dL (ref <10)

## 2019-04-27 MED ORDER — SODIUM CHLORIDE 0.9% FLUSH: 3.0000 mL | Freq: Once | INTRAVENOUS | Status: DC

## 2019-04-27 NOTE — ED Triage Notes (Signed)
Pt with chest pain off and on since yesterday, sob earlier but denies at present. EMS reported that has some foil pt holding to his chest.  Pt states that the foil "helps with the shock from going in his body".  Pt denies SI or HI.
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
## 2019-06-21 ENCOUNTER — Ambulatory Visit: Payer: Medicaid Other | Admitting: Gastroenterology
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
## 2019-08-19 IMAGING — DX DG ABD PORTABLE 1V
1 series · 1 of 1 positions shown · non-contrast
Comparison: None.

CLINICAL DATA: Feeding tube placement

EXAM:
PORTABLE ABDOMEN - 1 VIEW

[abdomen]
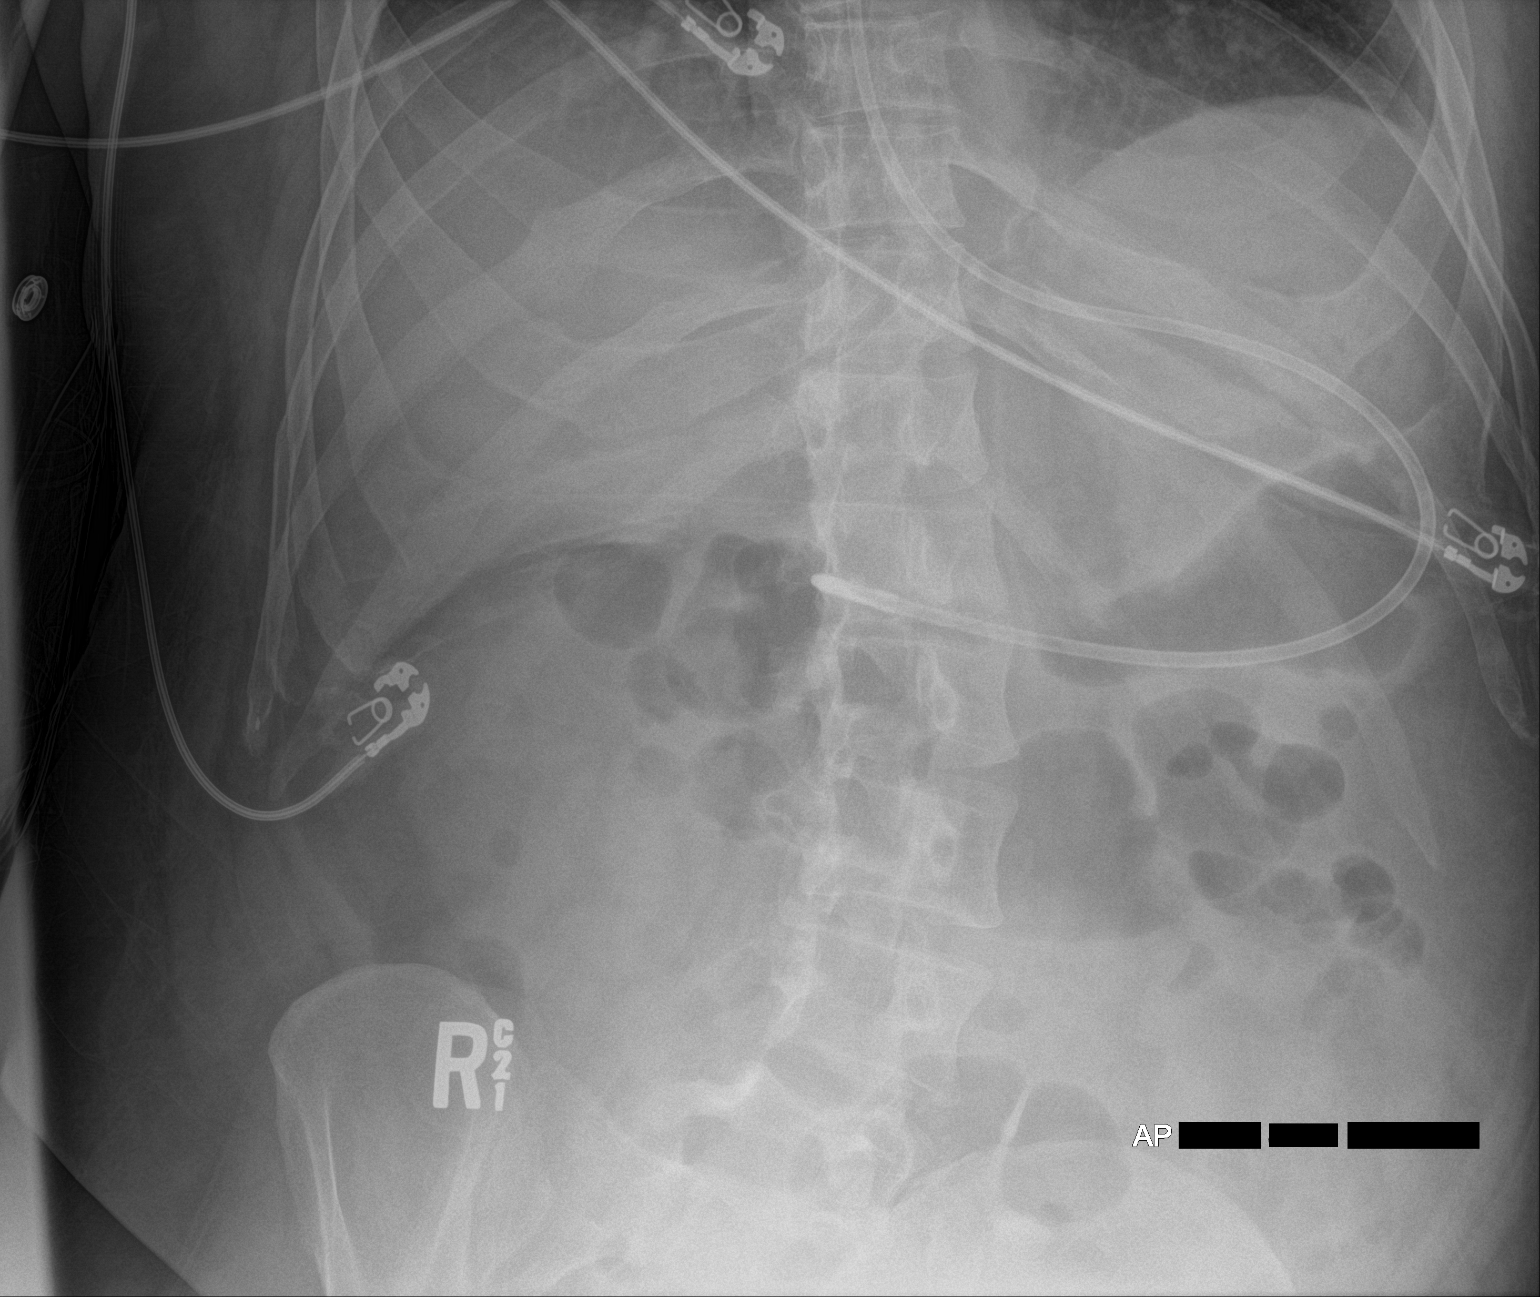

[1 of 1 positions shown; findings below may reference images not displayed]

FINDINGS: There is a feeding tube with the tip projecting over the antrum of
the stomach. There is no bowel dilatation to suggest obstruction.
There is no evidence of pneumoperitoneum, portal venous gas or
pneumatosis.

There are no pathologic calcifications along the expected course of
the ureters.

The osseous structures are unremarkable.
IMPRESSION: There is a feeding tube with the tip projecting over the antrum of
the stomach.

## 2019-11-04 ENCOUNTER — Emergency Department (HOSPITAL_COMMUNITY): Payer: No Typology Code available for payment source

## 2019-11-04 ENCOUNTER — Encounter (HOSPITAL_COMMUNITY): Payer: Self-pay

## 2019-11-04 ENCOUNTER — Emergency Department (HOSPITAL_COMMUNITY)
Admission: EM | Admit: 2019-11-04 | Discharge: 2019-11-04 | Disposition: A | Discharge status: Home / Self Care | Payer: No Typology Code available for payment source | Source: Home / Self Care | Attending: Emergency Medicine | Admitting: Emergency Medicine

## 2019-11-04 ENCOUNTER — Encounter (HOSPITAL_COMMUNITY): Payer: Self-pay | Admitting: Emergency Medicine

## 2019-11-04 ENCOUNTER — Emergency Department (HOSPITAL_COMMUNITY)
Admission: EM | Admit: 2019-11-04 | Discharge: 2019-11-04 | Disposition: A | Discharge status: Home / Self Care | Payer: No Typology Code available for payment source | Attending: Emergency Medicine | Admitting: Emergency Medicine

## 2019-11-04 ENCOUNTER — Other Ambulatory Visit: Payer: Self-pay

## 2019-11-04 DIAGNOSIS — S0083XA Contusion of other part of head, initial encounter: Secondary | ICD-10-CM | POA: Diagnosis not present

## 2019-11-04 DIAGNOSIS — S0012XA Contusion of left eyelid and periocular area, initial encounter: Secondary | ICD-10-CM | POA: Diagnosis not present

## 2019-11-04 DIAGNOSIS — S8001XA Contusion of right knee, initial encounter: Secondary | ICD-10-CM | POA: Insufficient documentation

## 2019-11-04 DIAGNOSIS — F17210 Nicotine dependence, cigarettes, uncomplicated: Secondary | ICD-10-CM | POA: Insufficient documentation

## 2019-11-04 DIAGNOSIS — R079 Chest pain, unspecified: Secondary | ICD-10-CM | POA: Insufficient documentation

## 2019-11-04 DIAGNOSIS — M546 Pain in thoracic spine: Secondary | ICD-10-CM | POA: Insufficient documentation

## 2019-11-04 DIAGNOSIS — I10 Essential (primary) hypertension: Secondary | ICD-10-CM | POA: Insufficient documentation

## 2019-11-04 DIAGNOSIS — S0990XA Unspecified injury of head, initial encounter: Secondary | ICD-10-CM | POA: Diagnosis present

## 2019-11-04 DIAGNOSIS — R1013 Epigastric pain: Secondary | ICD-10-CM | POA: Insufficient documentation

## 2019-11-04 DIAGNOSIS — E876 Hypokalemia: Secondary | ICD-10-CM

## 2019-11-04 DIAGNOSIS — M25511 Pain in right shoulder: Secondary | ICD-10-CM | POA: Diagnosis not present

## 2019-11-04 DIAGNOSIS — S80812A Abrasion, left lower leg, initial encounter: Secondary | ICD-10-CM | POA: Insufficient documentation

## 2019-11-04 DIAGNOSIS — R682 Dry mouth, unspecified: Secondary | ICD-10-CM | POA: Diagnosis not present

## 2019-11-04 DIAGNOSIS — Y92410 Unspecified street and highway as the place of occurrence of the external cause: Secondary | ICD-10-CM | POA: Diagnosis not present

## 2019-11-04 LAB — CBC WITH DIFFERENTIAL/PLATELET
Abs Immature Granulocytes: 0.11 10*3/uL — ABNORMAL HIGH (ref 0.00–0.07)
Basophils Absolute: 0 10*3/uL (ref 0.0–0.1)
Basophils Relative: 0 %
Eosinophils Absolute: 0.1 10*3/uL (ref 0.0–0.5)
Eosinophils Relative: 0 %
HCT: 44.7 % (ref 39.0–52.0)
Hemoglobin: 15.2 g/dL (ref 13.0–17.0)
Immature Granulocytes: 1 %
Lymphocytes Relative: 13 %
Lymphs Abs: 1.9 10*3/uL (ref 0.7–4.0)
MCH: 30.8 pg (ref 26.0–34.0)
MCHC: 34 g/dL (ref 30.0–36.0)
MCV: 90.5 fL (ref 80.0–100.0)
Monocytes Absolute: 0.8 10*3/uL (ref 0.1–1.0)
Monocytes Relative: 5 %
Neutro Abs: 12.1 10*3/uL — ABNORMAL HIGH (ref 1.7–7.7)
Neutrophils Relative %: 81 %
Platelets: 245 10*3/uL (ref 150–400)
RBC: 4.94 MIL/uL (ref 4.22–5.81)
RDW: 12.9 % (ref 11.5–15.5)
WBC: 15 10*3/uL — ABNORMAL HIGH (ref 4.0–10.5)
nRBC: 0 % (ref 0.0–0.2)

## 2019-11-04 LAB — LIPASE, BLOOD: Lipase: 21 U/L (ref 11–51)

## 2019-11-04 LAB — COMPREHENSIVE METABOLIC PANEL
ALT: 22 U/L (ref 0–44)
AST: 22 U/L (ref 15–41)
Albumin: 4.5 g/dL (ref 3.5–5.0)
Alkaline Phosphatase: 55 U/L (ref 38–126)
Anion gap: 15 (ref 5–15)
BUN: 6 mg/dL (ref 6–20)
CO2: 25 mmol/L (ref 22–32)
Calcium: 9.6 mg/dL (ref 8.9–10.3)
Chloride: 101 mmol/L (ref 98–111)
Creatinine, Ser: 1.07 mg/dL (ref 0.61–1.24)
GFR, Estimated: >60 mL/min (ref >60)
Glucose, Bld: 113 mg/dL — ABNORMAL HIGH (ref 70–99)
Potassium: 2.9 mmol/L — ABNORMAL LOW (ref 3.5–5.1)
Sodium: 141 mmol/L (ref 135–145)
Total Bilirubin: 1.1 mg/dL (ref 0.3–1.2)
Total Protein: 8.1 g/dL (ref 6.5–8.1)

## 2019-11-04 LAB — ETHANOL: Alcohol, Ethyl (B): <10 mg/dL (ref <10)

## 2019-11-04 LAB — TROPONIN I (HIGH SENSITIVITY)
Troponin I (High Sensitivity): 14 ng/L (ref <18)
Troponin I (High Sensitivity): 12 ng/L (ref <18)

## 2019-11-04 MED ORDER — POTASSIUM CHLORIDE CRYS ER 20 MEQ PO TBCR
40.0000 meq | EXTENDED_RELEASE_TABLET | Freq: Once | ORAL | Status: DC
  Filled 2019-11-04: qty 2

## 2019-11-04 MED ORDER — IOHEXOL 300 MG/ML  SOLN
100.0000 mL | Freq: Once | INTRAMUSCULAR | Status: AC | PRN
  Administered 2019-11-04: 80 mL via INTRAVENOUS

## 2019-11-04 MED ORDER — POTASSIUM CHLORIDE CRYS ER 20 MEQ PO TBCR: 20.0000 meq | EXTENDED_RELEASE_TABLET | Freq: Two times a day (BID) | ORAL | 0 refills | Status: DC

## 2019-11-04 MED ORDER — KETOROLAC TROMETHAMINE 30 MG/ML IJ SOLN
15.0000 mg | Freq: Once | INTRAMUSCULAR | Status: DC
  Filled 2019-11-04: qty 1

## 2019-11-04 MED ORDER — SODIUM CHLORIDE 0.9 % IV BOLUS
500.0000 mL | Freq: Once | INTRAVENOUS | Status: AC
  Administered 2019-11-04: 500 mL via INTRAVENOUS

## 2019-11-04 MED ORDER — METHOCARBAMOL 750 MG PO TABS: ORAL_TABLET | ORAL | 0 refills | Status: DC

## 2019-11-04 MED ORDER — HYDROCODONE-ACETAMINOPHEN 5-325 MG PO TABS
1.0000 | ORAL_TABLET | ORAL | Status: DC
  Filled 2019-11-04: qty 1

## 2019-11-04 MED ORDER — METHOCARBAMOL 500 MG PO TABS: ORAL_TABLET | ORAL | 0 refills | Status: DC

## 2019-11-04 MED ORDER — NAPROXEN 375 MG PO TABS: 375.0000 mg | ORAL_TABLET | Freq: Two times a day (BID) | ORAL | 0 refills | Status: DC
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
## 2019-11-04 NOTE — ED Notes (Signed)
Pt refusing pain medication.
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
## 2019-11-04 NOTE — Discharge Instructions (Addendum)
Ice packs to the injured or sore muscles for the next several days then start using heat. Take the medications for muscle soreness with acetaminophen 650 mg every 6 hrs. take the potassium pills until gone.  Keep the abrasion clean and dry.  You can use triple antibiotic ointment on it to prevent infection.  Return to the ED for any problems listed on the head injury sheet. Recheck if you aren't improving in the next week.
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
## 2019-11-04 NOTE — ED Notes (Signed)
Pt refused to sign discharge 
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
## 2021-03-08 ENCOUNTER — Emergency Department (HOSPITAL_COMMUNITY)
Admission: EM | Admit: 2021-03-08 | Discharge: 2021-03-11 | Disposition: A | Discharge status: Home / Self Care | Payer: Medicaid Other | Attending: Emergency Medicine | Admitting: Emergency Medicine

## 2021-03-08 ENCOUNTER — Encounter (HOSPITAL_COMMUNITY): Payer: Self-pay | Admitting: *Deleted

## 2021-03-08 ENCOUNTER — Other Ambulatory Visit: Payer: Self-pay

## 2021-03-08 DIAGNOSIS — R45850 Homicidal ideations: Secondary | ICD-10-CM | POA: Diagnosis not present

## 2021-03-08 DIAGNOSIS — F17210 Nicotine dependence, cigarettes, uncomplicated: Secondary | ICD-10-CM | POA: Insufficient documentation

## 2021-03-08 DIAGNOSIS — F333 Major depressive disorder, recurrent, severe with psychotic symptoms: Secondary | ICD-10-CM | POA: Diagnosis present

## 2021-03-08 DIAGNOSIS — Z20822 Contact with and (suspected) exposure to covid-19: Secondary | ICD-10-CM | POA: Diagnosis not present

## 2021-03-08 DIAGNOSIS — F22 Delusional disorders: Secondary | ICD-10-CM | POA: Insufficient documentation

## 2021-03-08 DIAGNOSIS — F209 Schizophrenia, unspecified: Secondary | ICD-10-CM | POA: Insufficient documentation

## 2021-03-08 DIAGNOSIS — I10 Essential (primary) hypertension: Secondary | ICD-10-CM | POA: Diagnosis not present

## 2021-03-08 LAB — COMPREHENSIVE METABOLIC PANEL
ALT: 23 U/L (ref 0–44)
AST: 20 U/L (ref 15–41)
Albumin: 4.2 g/dL (ref 3.5–5.0)
Alkaline Phosphatase: 66 U/L (ref 38–126)
Anion gap: 9 (ref 5–15)
BUN: 9 mg/dL (ref 6–20)
CO2: 22 mmol/L (ref 22–32)
Calcium: 8.8 mg/dL — ABNORMAL LOW (ref 8.9–10.3)
Chloride: 108 mmol/L (ref 98–111)
Creatinine, Ser: 1.11 mg/dL (ref 0.61–1.24)
GFR, Estimated: >60 mL/min (ref >60)
Glucose, Bld: 103 mg/dL — ABNORMAL HIGH (ref 70–99)
Potassium: 3.9 mmol/L (ref 3.5–5.1)
Sodium: 139 mmol/L (ref 135–145)
Total Bilirubin: 0.8 mg/dL (ref 0.3–1.2)
Total Protein: 7.6 g/dL (ref 6.5–8.1)

## 2021-03-08 LAB — CBC WITH DIFFERENTIAL/PLATELET
Abs Immature Granulocytes: 0.04 10*3/uL (ref 0.00–0.07)
Basophils Absolute: 0.1 10*3/uL (ref 0.0–0.1)
Basophils Relative: 1 %
Eosinophils Absolute: 0.1 10*3/uL (ref 0.0–0.5)
Eosinophils Relative: 2 %
HCT: 52.6 % — ABNORMAL HIGH (ref 39.0–52.0)
Hemoglobin: 17.6 g/dL — ABNORMAL HIGH (ref 13.0–17.0)
Immature Granulocytes: 1 %
Lymphocytes Relative: 27 %
Lymphs Abs: 2.1 10*3/uL (ref 0.7–4.0)
MCH: 29.4 pg (ref 26.0–34.0)
MCHC: 33.5 g/dL (ref 30.0–36.0)
MCV: 87.8 fL (ref 80.0–100.0)
Monocytes Absolute: 0.5 10*3/uL (ref 0.1–1.0)
Monocytes Relative: 6 %
Neutro Abs: 5 10*3/uL (ref 1.7–7.7)
Neutrophils Relative %: 63 %
Platelets: 255 10*3/uL (ref 150–400)
RBC: 5.99 MIL/uL — ABNORMAL HIGH (ref 4.22–5.81)
RDW: 12.6 % (ref 11.5–15.5)
WBC: 7.8 10*3/uL (ref 4.0–10.5)
nRBC: 0 % (ref 0.0–0.2)

## 2021-03-08 LAB — RAPID URINE DRUG SCREEN, HOSP PERFORMED
Amphetamines: NOT DETECTED
Barbiturates: NOT DETECTED
Benzodiazepines: NOT DETECTED
Cocaine: NOT DETECTED
Opiates: NOT DETECTED
Tetrahydrocannabinol: NOT DETECTED

## 2021-03-08 LAB — ETHANOL: Alcohol, Ethyl (B): <10 mg/dL (ref <10)

## 2021-03-08 NOTE — ED Triage Notes (Signed)
Pt brought in with [employer] with IVC papers-pt with schizophrenia and acting bizarre, yelling at family member.  Per papers pt with hallucinations but pt is denying in triage. Pt denies any HI or SI.  Pt denies any drug or ETOH use.

## 2021-03-08 NOTE — ED Notes (Signed)
IVC paperwork faxed to [phone]

## 2021-03-08 NOTE — ED Notes (Signed)
Patient eating dinner.

## 2021-03-08 NOTE — ED Notes (Signed)
Patient up to the bathroom.
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
## 2021-03-08 NOTE — ED Notes (Signed)
Lab at bedside
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
## 2021-03-08 NOTE — ED Notes (Signed)
Patient ambulated to the bathroom with assistance.
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
## 2021-03-09 DIAGNOSIS — F209 Schizophrenia, unspecified: Secondary | ICD-10-CM | POA: Diagnosis present

## 2021-03-09 NOTE — ED Notes (Signed)
Pt is having TTS

## 2021-03-09 NOTE — ED Notes (Signed)
Pt taken to the bathroom.

## 2021-03-09 NOTE — ED Notes (Signed)
NP recommends inpatient psychiatric treatment. [facility] is at capacity and other facilities will be contacted for placement

## 2021-03-09 NOTE — ED Notes (Signed)
Telepsych at the bedside at this time.
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
## 2021-03-09 NOTE — Progress Notes (Signed)
Per Benjamin [name], patient meets criteria for inpatient treatment. There are no available or appropriate beds at [facility] today. CSW faxed referrals to the following facilities for review:  [facility]  Pending - No Request Sent N/A [address], [place] [address] [phone] --  [place] No Request Sent N/A [address], [place] [address] [phone] --  [place]  Pending - No Request Sent N/A [address] [place] [address] [phone] --  [facility]  Pending - No Request Sent N/A [address] [place] [address] [phone] --  [place] [address] [place] [address] [phone] --  [place]  Pending - No Request Sent N/A [address] [place] [address] [phone] --  [facility]  Pending - No Request Sent N/A [address] [place], [place] [address] [phone] --  [facility]  Pending - No Request Sent N/A [address] [place] [address] [place] --  [facility]  Pending - No Request Sent N/A [address] [place] [address] [facility]  Pending - No Request Sent N/A [address] [place] [address] [phone] --  [facility]  Pending - No Request Sent N/A [address] [place] [address] [phone] --  [place]  Pending - No Request Sent N/A [address] [place] [address] [phone] --  [place]  Pending - No Request Sent N/A [address], [place] [address] [phone] --  [facility]  Pending - No  Request Sent N/A [address] [place] [address] [phone] --  [place]  Pending - No Request Sent N/A [address] [place] [address] [phone] --  [facility]  Pending - No Request Sent N/A [address], [place] [address] [phone] --  [facility]  Pending - No Request Sent N/A [address] [place]  [address] [phone] --    TTS will continue to seek bed placement.  [place]ie [place], MSW, [place], LCAS-A Phone: [phone] Disposition/TOC

## 2021-03-10 LAB — RESP PANEL BY RT-PCR (FLU A&B, COVID) ARPGX2
Influenza A by PCR: NEGATIVE
Influenza B by PCR: NEGATIVE
SARS Coronavirus 2 by RT PCR: NEGATIVE

## 2021-03-10 MED ORDER — OLANZAPINE 5 MG PO TABS
5.0000 mg | ORAL_TABLET | Freq: Every day | ORAL | Status: DC
  Administered 2021-03-10 – 2021-03-11 (×2): 5 mg via ORAL
  Filled 2021-03-10 (×2): qty 1

## 2021-03-10 MED ORDER — OLANZAPINE 5 MG PO TABS: 5.0000 mg | ORAL_TABLET | Freq: Every day | ORAL | Status: DC

## 2021-03-10 MED ORDER — TRAZODONE HCL 50 MG PO TABS
50.0000 mg | ORAL_TABLET | Freq: Every day | ORAL | Status: DC
  Filled 2021-03-10: qty 1

## 2021-03-10 NOTE — ED Notes (Signed)
Pt remains asleep
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
## 2021-03-10 NOTE — ED Provider Notes (Signed)
°  Physical Exam  BP 101/78 (BP Location: Left Arm)    Pulse 77    Temp 98.1 F (36.7 C) (Oral)    Resp 20    Ht 5\' 7"  (1.702 m)    Wt 72.6 kg    SpO2 97%    BMI 25.06 kg/m   Physical Exam  Procedures  Procedures  ED Course / MDM   Clinical Course as of 03/10/21 2111  Sat Mar 08, 2021  1735 IVC petition has been upheld with the first examination for involuntary commitment paperwork by me. [EW]    Clinical Course User Index [EW] Daleen Bo, MD   Medical Decision Making Amount and/or Complexity of Data Reviewed Labs: ordered.   Patient presents under IVC.  Currently schizophrenic and is remaining worrisome statements.  Inpatient treatment recommended.  Psychiatry appears as if they will adjust medicines and evaluate daily.  Placement being sought.       Davonna Belling, MD 03/10/21 [phone]

## 2021-03-10 NOTE — Progress Notes (Signed)
Pt accepted at [facility] PENDING negative COVID-19 for 03/11/21 after 9:30am.   CSW attempted to contact [facility] via phone [phone], [phone], [phone] and was not successful.  CSW was informed that pt has a bed offer PENDING negative COVID-19. Accepting provider Dr. Jonelle Sports. CSW was notified via nursing: Meyer Russel, RN, and Tana Coast, RN. Communication update included the following care team members: Meyer Russel, RN, Tana Coast, RN, and Ethelene Hal, NP.   Benjaman Kindler, MSW, [place] [facility] 03/10/2021 4:41 PM

## 2021-03-10 NOTE — ED Notes (Signed)
Covid results faxed to [facility]. Confirmation verified.

## 2021-03-10 NOTE — ED Notes (Addendum)
[facility] called and given update. Will hold bed pending covid/flu results. Accepting Dr Duffy Bruce. May arrive after 0930 pending covid/flu results.

## 2021-03-10 NOTE — Progress Notes (Signed)
Patient has been faxed out due to no appropriate beds available at [facility]. Patient meets [place] inpatient criteria per Leandro Reasoner, NP. Patient has been faxed out to the following facilities:   [facility]  [address], [place] [address] [phone]  [address] [place] [address] [phone]  [facility]  [address], [place] [address] [place]  [facility]  [address] [place] [address] [place]  [facility]  [address], [place] [address] [phone]  [place], [place] [address] [phone] [facility]  [address] [place], [place] [address] [phone] [facility]  [address] [place] [address] [phone]  [facility]  [address] [place] [address] [place]  [facility]  [address], [place] [address] [phone]  [facility]  [address], [place] [address] [phone] [place]  [address] [place] [address] [phone] [place]  [address] [place] [address] [phone] [facility]  [address], [place] [address] [phone]  [facility]  [address] [place] [address] [phone] [facility]  [address] [place] [address] [phone]  [facility]  [address] [place] [address] [place]   Mariea Clonts, MSW, LCSW-A  3:23 PM 03/10/2021

## 2021-03-10 NOTE — ED Notes (Signed)
Pt remains asleep at this time. Equal rise and fall of the chest noted.

## 2021-03-10 NOTE — ED Notes (Signed)
Pt breakfast at bedside

## 2021-03-11 NOTE — Progress Notes (Signed)
[facility] LCSW Progress Note   03/11/2021    12:38 PM  Benjamin [name]   [id number]   Type of Contact and Topic:  Psychiatric Bed Placement   Pt accepted to [facility]     Patient meets inpatient criteria per Leandro Reasoner, NP   The attending provider will be Jonelle Sports, MD  Call report to [phone]  Aliene Beams, RN @ [facility] notified.     Pt scheduled  to arrive at [facility] ANYTIME. Please fax IVC paperwork prior to transporting the Pt to the facility.    Mariea Clonts, MSW, LCSW-A  12:40 PM 03/11/2021

## 2021-03-11 NOTE — ED Notes (Signed)
Escorted pt to bathroom

## 2021-03-11 NOTE — ED Provider Notes (Signed)
Emergency Medicine Observation Re-evaluation Note  Benjamin [name] is a 51 y.o. male, seen on rounds today.  Pt initially presented to the ED for complaints of V70.1 Currently, the patient is sitting in room.  Physical Exam  BP 120/87    Pulse 66    Temp 98.4 F (36.9 C)    Resp 18    Ht 5\' 7"  (1.702 m)    Wt 72.6 kg    SpO2 97%    BMI 25.06 kg/m  Physical Exam General: resting comfortably, NAD Lungs: normal WOB Psych: currently calm and resting  ED Course / MDM  EKG:EKG Interpretation  Date/Time:  Sunday March 09 2021 12:09:58 EST Ventricular Rate:  82 PR Interval:  164 QRS Duration: 90 QT Interval:  384 QTC Calculation: 448 R Axis:   120 Text Interpretation: Suspect arm lead reversal, interpretation assumes no reversal Normal sinus rhythm Left posterior fascicular block Nonspecific ST abnormality Abnormal ECG When compared with ECG of 04-Nov-2019 02:05, PREVIOUS ECG IS PRESENT since last tracing no significant change Confirmed by Malvin Johns [phone]) on 03/10/2021 10:19:51 AM  I have reviewed the labs performed to date as well as medications administered while in observation.  Recent changes in the last 24 hours include none.  Plan  Current plan is for inpatient treatment. Benjamin [name] is under involuntary commitment.      Lorelle Gibbs, [place] 03/11/21 [phone]

## 2021-03-11 NOTE — ED Notes (Signed)
Have tried to contact [facility] multiple times this am for report without success. Tried all three numbers listed in SW note.

## 2021-03-11 NOTE — ED Provider Notes (Signed)
Patient has been accepted to [facility], will complete EMTALA.   Lorelle Gibbs, DO 03/11/21 1252

## 2021-03-11 NOTE — ED Notes (Signed)
Pt breakfast at bedside

## 2021-03-11 NOTE — ED Notes (Addendum)
Spoke with admissions department whom states that patient is in queue there and they have not approved him for admission yet and will call us when they have a bed assignment/offer for placement.

## 2022-09-30 ENCOUNTER — Encounter: Payer: Self-pay | Admitting: *Deleted

## 2023-12-31 ENCOUNTER — Other Ambulatory Visit: Payer: Self-pay

## 2023-12-31 ENCOUNTER — Encounter (HOSPITAL_COMMUNITY): Payer: Self-pay

## 2023-12-31 ENCOUNTER — Emergency Department (HOSPITAL_COMMUNITY)
Admission: EM | Admit: 2023-12-31 | Discharge: 2024-01-01 | Payer: MEDICAID | Attending: Emergency Medicine | Admitting: Emergency Medicine

## 2023-12-31 DIAGNOSIS — Z91199 Patient's noncompliance with other medical treatment and regimen due to unspecified reason: Secondary | ICD-10-CM | POA: Diagnosis not present

## 2023-12-31 DIAGNOSIS — F2089 Other schizophrenia: Secondary | ICD-10-CM | POA: Diagnosis not present

## 2023-12-31 DIAGNOSIS — I10 Essential (primary) hypertension: Secondary | ICD-10-CM | POA: Insufficient documentation

## 2023-12-31 DIAGNOSIS — R45850 Homicidal ideations: Secondary | ICD-10-CM

## 2023-12-31 DIAGNOSIS — F17210 Nicotine dependence, cigarettes, uncomplicated: Secondary | ICD-10-CM | POA: Insufficient documentation

## 2023-12-31 DIAGNOSIS — F209 Schizophrenia, unspecified: Secondary | ICD-10-CM | POA: Insufficient documentation

## 2023-12-31 HISTORY — DX: Schizophrenia, unspecified: F20.9

## 2023-12-31 LAB — COMPREHENSIVE METABOLIC PANEL WITH GFR
ALT: 19 U/L (ref 0–44)
AST: 26 U/L (ref 15–41)
Albumin: 4.7 g/dL (ref 3.5–5.0)
Alkaline Phosphatase: 74 U/L (ref 38–126)
Anion gap: 15 (ref 5–15)
BUN: 5 mg/dL — ABNORMAL LOW (ref 6–20)
CO2: 24 mmol/L (ref 22–32)
Calcium: 9.4 mg/dL (ref 8.9–10.3)
Chloride: 101 mmol/L (ref 98–111)
Creatinine, Ser: 0.99 mg/dL (ref 0.61–1.24)
GFR, Estimated: >60 mL/min (ref >60)
Glucose, Bld: 100 mg/dL — ABNORMAL HIGH (ref 70–99)
Potassium: 3.8 mmol/L (ref 3.5–5.1)
Sodium: 140 mmol/L (ref 135–145)
Total Bilirubin: 0.7 mg/dL (ref 0.0–1.2)
Total Protein: 7.6 g/dL (ref 6.5–8.1)

## 2023-12-31 LAB — URINE DRUG SCREEN
Amphetamines: NEGATIVE
Barbiturates: NEGATIVE
Benzodiazepines: NEGATIVE
Cocaine: NEGATIVE
Fentanyl: NEGATIVE
Methadone Scn, Ur: NEGATIVE
Opiates: NEGATIVE
Tetrahydrocannabinol: NEGATIVE

## 2023-12-31 LAB — CBC
HCT: 51.7 % (ref 39.0–52.0)
Hemoglobin: 17.6 g/dL — ABNORMAL HIGH (ref 13.0–17.0)
MCH: 30.4 pg (ref 26.0–34.0)
MCHC: 34 g/dL (ref 30.0–36.0)
MCV: 89.4 fL (ref 80.0–100.0)
Platelets: 259 K/uL (ref 150–400)
RBC: 5.78 MIL/uL (ref 4.22–5.81)
RDW: 12.3 % (ref 11.5–15.5)
WBC: 11.7 K/uL — ABNORMAL HIGH (ref 4.0–10.5)
nRBC: 0 % (ref 0.0–0.2)

## 2023-12-31 LAB — ETHANOL: Alcohol, Ethyl (B): <15 mg/dL (ref <15)

## 2023-12-31 MED ORDER — TRAZODONE HCL 50 MG PO TABS
50.0000 mg | ORAL_TABLET | Freq: Every evening | ORAL | Status: DC | PRN
  Administered 2023-12-31: 50 mg via ORAL

## 2023-12-31 MED ORDER — OLANZAPINE 5 MG PO TBDP
5.0000 mg | ORAL_TABLET | Freq: Two times a day (BID) | ORAL | Status: DC
  Administered 2023-12-31 – 2024-01-01 (×2): 5 mg via ORAL
  Filled 2023-12-31 (×2): qty 1

## 2023-12-31 MED ORDER — ACETAMINOPHEN 500 MG PO TABS
500.0000 mg | ORAL_TABLET | Freq: Once | ORAL | Status: AC
  Administered 2023-12-31: 500 mg via ORAL
  Filled 2023-12-31: qty 1

## 2023-12-31 MED ORDER — TRAZODONE HCL 50 MG PO TABS
50.0000 mg | ORAL_TABLET | Freq: Every day | ORAL | Status: DC
  Filled 2023-12-31: qty 1

## 2023-12-31 MED ORDER — TRAZODONE HCL 50 MG PO TABS
150.0000 mg | ORAL_TABLET | Freq: Every evening | ORAL | Status: DC | PRN
  Filled 2023-12-31: qty 3
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
## 2023-12-31 NOTE — ED Notes (Signed)
 Cart in room for tts
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
## 2023-12-31 NOTE — BH Assessment (Signed)
 Clinician spoke to Benjamin [name] with [facility] to complete pt's TTS assessment. Clinician provided pt's name, MRN, location, age, room number and provider's name. Secure message completed.    [facility] coordinator to update secure chat when assessment time and provider are assigned.  Benjamin JONETTA Broach, MS, [facility], [facility] Triage Specialist [phone]

## 2023-12-31 NOTE — ED Notes (Signed)
 ED Provider at bedside.

## 2023-12-31 NOTE — ED Notes (Signed)
 Pt stated he needs something to help him sleep. EDP made aware

## 2023-12-31 NOTE — ED Notes (Signed)
 Pt dressed out; shoes, jeans, and shirt in pts belongings bag in locker #1

## 2024-01-01 ENCOUNTER — Encounter (HOSPITAL_COMMUNITY): Payer: Self-pay | Admitting: Psychiatry

## 2024-01-01 ENCOUNTER — Inpatient Hospital Stay (HOSPITAL_COMMUNITY)
Admission: AD | Admit: 2024-01-01 | Discharge: 2024-01-07 | DRG: 885 | Disposition: A | Discharge status: Home / Self Care | Payer: MEDICAID | Source: Intra-hospital

## 2024-01-01 ENCOUNTER — Other Ambulatory Visit: Payer: Self-pay

## 2024-01-01 DIAGNOSIS — Z8249 Family history of ischemic heart disease and other diseases of the circulatory system: Secondary | ICD-10-CM

## 2024-01-01 DIAGNOSIS — F03911 Unspecified dementia, unspecified severity, with agitation: Secondary | ICD-10-CM | POA: Diagnosis present

## 2024-01-01 DIAGNOSIS — G8929 Other chronic pain: Secondary | ICD-10-CM | POA: Diagnosis present

## 2024-01-01 DIAGNOSIS — F200 Paranoid schizophrenia: Principal | ICD-10-CM | POA: Diagnosis present

## 2024-01-01 DIAGNOSIS — F0394 Unspecified dementia, unspecified severity, with anxiety: Secondary | ICD-10-CM | POA: Diagnosis present

## 2024-01-01 DIAGNOSIS — F17213 Nicotine dependence, cigarettes, with withdrawal: Secondary | ICD-10-CM | POA: Diagnosis present

## 2024-01-01 DIAGNOSIS — Z833 Family history of diabetes mellitus: Secondary | ICD-10-CM | POA: Diagnosis not present

## 2024-01-01 DIAGNOSIS — F0390 Unspecified dementia without behavioral disturbance: Secondary | ICD-10-CM | POA: Diagnosis not present

## 2024-01-01 DIAGNOSIS — F22 Delusional disorders: Secondary | ICD-10-CM | POA: Diagnosis present

## 2024-01-01 DIAGNOSIS — G4700 Insomnia, unspecified: Secondary | ICD-10-CM | POA: Diagnosis present

## 2024-01-01 DIAGNOSIS — H538 Other visual disturbances: Secondary | ICD-10-CM | POA: Diagnosis present

## 2024-01-01 DIAGNOSIS — F29 Unspecified psychosis not due to a substance or known physiological condition: Secondary | ICD-10-CM | POA: Diagnosis not present

## 2024-01-01 DIAGNOSIS — F209 Schizophrenia, unspecified: Secondary | ICD-10-CM | POA: Diagnosis not present

## 2024-01-01 DIAGNOSIS — K219 Gastro-esophageal reflux disease without esophagitis: Secondary | ICD-10-CM | POA: Diagnosis present

## 2024-01-01 DIAGNOSIS — R4586 Emotional lability: Secondary | ICD-10-CM | POA: Diagnosis present

## 2024-01-01 DIAGNOSIS — Z634 Disappearance and death of family member: Secondary | ICD-10-CM

## 2024-01-01 DIAGNOSIS — Z6372 Alcoholism and drug addiction in family: Secondary | ICD-10-CM | POA: Diagnosis not present

## 2024-01-01 DIAGNOSIS — H5789 Other specified disorders of eye and adnexa: Secondary | ICD-10-CM | POA: Diagnosis present

## 2024-01-01 DIAGNOSIS — Z79899 Other long term (current) drug therapy: Secondary | ICD-10-CM

## 2024-01-01 MED ORDER — ACETAMINOPHEN 325 MG PO TABS
650.0000 mg | ORAL_TABLET | Freq: Four times a day (QID) | ORAL | Status: DC | PRN
  Administered 2024-01-04 – 2024-01-06 (×2): 650 mg via ORAL
  Filled 2024-01-01 (×2): qty 2

## 2024-01-01 MED ORDER — DIPHENHYDRAMINE HCL 50 MG/ML IJ SOLN: 50.0000 mg | Freq: Three times a day (TID) | INTRAMUSCULAR | Status: DC | PRN

## 2024-01-01 MED ORDER — NICOTINE 21 MG/24HR TD PT24
21.0000 mg | MEDICATED_PATCH | Freq: Every day | TRANSDERMAL | Status: DC
  Administered 2024-01-02 – 2024-01-07 (×6): 21 mg via TRANSDERMAL
  Filled 2024-01-01 (×6): qty 1

## 2024-01-01 MED ORDER — TRAZODONE HCL 50 MG PO TABS
50.0000 mg | ORAL_TABLET | Freq: Every evening | ORAL | Status: DC | PRN
  Administered 2024-01-02 – 2024-01-05 (×4): 50 mg via ORAL
  Filled 2024-01-01 (×5): qty 1

## 2024-01-01 MED ORDER — HALOPERIDOL 5 MG PO TABS: 5.0000 mg | ORAL_TABLET | Freq: Three times a day (TID) | ORAL | Status: DC | PRN

## 2024-01-01 MED ORDER — DIPHENHYDRAMINE HCL 25 MG PO CAPS: 50.0000 mg | ORAL_CAPSULE | Freq: Three times a day (TID) | ORAL | Status: DC | PRN

## 2024-01-01 MED ORDER — ALUM & MAG HYDROXIDE-SIMETH 200-200-20 MG/5ML PO SUSP: 30.0000 mL | ORAL | Status: DC | PRN

## 2024-01-01 MED ORDER — MAGNESIUM HYDROXIDE 400 MG/5ML PO SUSP: 30.0000 mL | Freq: Every day | ORAL | Status: DC | PRN

## 2024-01-01 MED ORDER — HYDROXYZINE HCL 25 MG PO TABS
25.0000 mg | ORAL_TABLET | Freq: Three times a day (TID) | ORAL | Status: DC | PRN
  Administered 2024-01-02 – 2024-01-05 (×4): 25 mg via ORAL
  Filled 2024-01-01 (×5): qty 1

## 2024-01-01 MED ORDER — LORAZEPAM 2 MG/ML IJ SOLN: 2.0000 mg | Freq: Three times a day (TID) | INTRAMUSCULAR | Status: DC | PRN

## 2024-01-01 MED ORDER — HALOPERIDOL LACTATE 5 MG/ML IJ SOLN: 5.0000 mg | Freq: Three times a day (TID) | INTRAMUSCULAR | Status: DC | PRN

## 2024-01-01 MED ORDER — HALOPERIDOL LACTATE 5 MG/ML IJ SOLN: 10.0000 mg | Freq: Three times a day (TID) | INTRAMUSCULAR | Status: DC | PRN

## 2024-01-01 NOTE — Group Note (Signed)
 Date:  01/01/2024 Time:  7:40 PM  Group Topic/Focus:  Documentary on [place] and it's impact on mental health.     Participation Level:  Did Not Attend   Juliene CHRISTELLA Huddle 01/01/2024, 7:40 PM

## 2024-01-01 NOTE — ED Notes (Signed)
 Pt ate breakfast, went to bathroom and is back in room resting calmly.

## 2024-01-01 NOTE — Group Note (Signed)
 Date:  01/01/2024 Time:  9:19 PM  Group Topic/Focus:  Wrap-Up Group:   The focus of this group is to help patients review their daily goal of treatment and discuss progress on daily workbooks.    Additional Comments:  Pt was encouraged, but opted out of attending wrap up group this evening.   Rosella DELENA Pouch 01/01/2024, 9:19 PM

## 2024-01-01 NOTE — Tx Team (Signed)
 Initial Treatment Plan 01/01/2024 7:31 PM [name] [name] FMW:[id number]    PATIENT STRESSORS: Legal issue     PATIENT STRENGTHS: Education Administrator    PATIENT IDENTIFIED PROBLEMS: Family affairs                     DISCHARGE CRITERIA:  Ability to meet basic life and health needs  PRELIMINARY DISCHARGE PLAN:    PATIENT/FAMILY INVOLVEMENT: This treatment plan has been presented to and reviewed with the patient, Benjamin [name].  The patient and family have been given the opportunity to ask questions and make suggestions.  Elouise Wolm Morel, RN 01/01/2024, 7:31 PM

## 2024-01-01 NOTE — ED Notes (Signed)
 [facility] picked up patient and is transporting to [facility].

## 2024-01-01 NOTE — ED Notes (Signed)
 Patients belongings handed to [facility].
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
## 2024-01-01 NOTE — Progress Notes (Signed)
 D) Pt received calm, visible, participating in milieu, and in no acute distress. Pt A & O x4. Pt denies SI, HI, A/ V H, depression, anxiety and pain at this time. A) Pt encouraged to drink fluids. Pt encouraged to come to staff with needs. Pt encouraged to attend and participate in groups. Pt encouraged to set reachable goals.  R) Pt remained safe on unit, in no acute distress, will continue to assess.     01/01/24 2100  Psych Admission Type (Psych Patients Only)  Admission Status Involuntary  Psychosocial Assessment  Patient Complaints Sleep disturbance  Eye Contact Brief  Facial Expression Anxious  Affect Appropriate to circumstance  Speech Logical/coherent  Interaction Assertive  Motor Activity Slow  Appearance/Hygiene In scrubs  Behavior Characteristics Appropriate to situation  Mood Sad  Thought Process  Coherency WDL  Content Blaming others  Delusions None reported or observed  Perception WDL  Hallucination None reported or observed  Judgment Poor  Confusion None  Danger to Self  Current suicidal ideation? Denies  Agreement Not to Harm Self Yes  Description of Agreement verbal  Danger to Others  Danger to Others None reported or observed  Danger to Others Abnormal  Harmful Behavior to others No threats or harm toward other people  Destructive Behavior No threats or harm toward property

## 2024-01-01 NOTE — ED Provider Notes (Signed)
 Emergency Medicine Observation Re-evaluation Note  Benjamin [name] is a 52 y.o. male, seen on rounds today.  Pt initially presented to the ED for complaints of IVC Currently, the patient is awaiting psych placement.  Physical Exam  BP 111/84 (BP Location: Left Arm)   Pulse 96   Temp 97.9 F (36.6 C) (Oral)   Resp 20   Ht 5' 7 (1.702 m)   Wt 72.6 kg   SpO2 97%   BMI 25.06 kg/m  Physical Exam Resting in no acute distress  ED Course / MDM  EKG:EKG Interpretation Date/Time:  Saturday January 01 2024 01:33:01 EST Ventricular Rate:  80 PR Interval:  156 QRS Duration:  114 QT Interval:  400 QTC Calculation: 461 R Axis:   53  Text Interpretation: Normal sinus rhythm Right bundle branch block Abnormal ECG When compared with ECG of 09-Mar-2021 12:09, Arm lead reversal is no longer present Confirmed by Raford Lenis ([id number]) on 01/01/2024 5:57:01 AM  I have reviewed the labs performed to date as well as medications administered while in observation.  Recent changes in the last 24 hours include none.  Plan  Current plan is for psych placement.    Suzette Pac, MD 01/01/24 [id number]

## 2024-01-02 DIAGNOSIS — F209 Schizophrenia, unspecified: Secondary | ICD-10-CM

## 2024-01-02 DIAGNOSIS — F29 Unspecified psychosis not due to a substance or known physiological condition: Secondary | ICD-10-CM

## 2024-01-02 LAB — LIPID PANEL
Cholesterol: 153 mg/dL (ref 0–200)
HDL: 29 mg/dL — ABNORMAL LOW (ref >40)
LDL Cholesterol: 89 mg/dL (ref 0–99)
Total CHOL/HDL Ratio: 5.2 ratio
Triglycerides: 175 mg/dL — ABNORMAL HIGH (ref <150)
VLDL: 35 mg/dL (ref 0–40)

## 2024-01-02 LAB — HEMOGLOBIN A1C
Hgb A1c MFr Bld: 4.8 % (ref 4.8–5.6)
Mean Plasma Glucose: 91.06 mg/dL

## 2024-01-02 LAB — TSH: TSH: 1.33 u[IU]/mL (ref 0.350–4.500)

## 2024-01-02 MED ORDER — NAPHAZOLINE-GLYCERIN 0.012-0.25 % OP SOLN
1.0000 [drp] | Freq: Four times a day (QID) | OPHTHALMIC | Status: DC | PRN
  Administered 2024-01-04 – 2024-01-06 (×2): 2 [drp] via OPHTHALMIC
  Filled 2024-01-02: qty 15

## 2024-01-02 MED ORDER — OLANZAPINE 5 MG PO TABS
5.0000 mg | ORAL_TABLET | Freq: Every day | ORAL | Status: DC
  Administered 2024-01-02: 5 mg via ORAL
  Filled 2024-01-02: qty 1

## 2024-01-02 NOTE — Progress Notes (Signed)
(  Sleep Hours) - 9.25 (Any PRNs that were needed, meds refused, or side effects to meds)- none (Any disturbances and when (visitation, over night)- none (Concerns raised by the patient)- Pt reports being unable to see this morning. Pt reports this started at [facility] and has continued.  (SI/HI/AVH)- denies all
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
## 2024-01-02 NOTE — Plan of Care (Signed)

## 2024-01-02 NOTE — Group Note (Signed)
 Date:  01/02/2024 Time:  7:31 PM  Group Topic/Focus:  Self Care:   The focus of this group is to help patients understand the importance of self-care in order to improve or restore emotional, physical, spiritual, interpersonal, and financial health. Time management/priority setting.      Participation Level:  Did Not Attend   Benjamin [name] 01/02/2024, 7:31 PM
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
## 2024-01-02 NOTE — Group Note (Signed)
 Date:  01/02/2024 Time:  9:18 PM  Group Topic/Focus:  Coping With Mental Health Crisis:   The purpose of this group is to help patients identify strategies for coping with mental health crisis.  Group discusses possible causes of crisis and ways to manage them effectively.    Pt did not attend group.  Jalisha Enneking L 01/02/2024, 9:18 PM

## 2024-01-02 NOTE — Plan of Care (Signed)
  Problem: Activity: Goal: Sleeping patterns will improve Outcome: Progressing   

## 2024-01-03 ENCOUNTER — Encounter (HOSPITAL_COMMUNITY): Payer: Self-pay

## 2024-01-03 MED ORDER — OLANZAPINE 5 MG PO TABS
5.0000 mg | ORAL_TABLET | Freq: Two times a day (BID) | ORAL | Status: DC
  Administered 2024-01-03 – 2024-01-07 (×9): 5 mg via ORAL
  Filled 2024-01-03 (×9): qty 1

## 2024-01-03 NOTE — Group Note (Signed)
 Recreation Therapy Group Note   Group Topic:Team Building  Group Date: 01/03/2024 Start Time: 0932 End Time: 1007 Facilitators: Kash Mothershead-McCall, LRT,CTRS Location: [address] Day[address]   Group Topic: Communication, Team Building, Problem Solving  Goal Area(s) Addresses:  Patient will effectively work with peer towards shared goal.  Patient will identify skills used to make activity successful.  Patient will identify how skills used during activity can be used to reach post d/c goals.   Behavioral Response:   Intervention: STEM Activity  Activity: Landing Pad. In teams of 3-5, patients were given 12 plastic drinking straws and an equal length of masking tape. Using the materials provided, patients were asked to build a landing pad to catch a golf ball dropped from approximately 5 feet in the air. All materials were required to be used by the team in their design. LRT facilitated post-activity discussion.  Education: Pharmacist, Community, Scientist, Physiological, Discharge Planning   Education Outcome: Acknowledges education/In group clarification offered/Needs additional education.    Affect/Mood: N/A   Participation Level: Did not attend    Clinical Observations/Individualized Feedback:      Plan: Continue to engage patient in RT group sessions 2-3x/week.   Benjamin [name], LRT,CTRS 01/03/2024 11:55 AM

## 2024-01-03 NOTE — Plan of Care (Signed)
   Problem: Education: Goal: Emotional status will improve Outcome: Progressing   Problem: Education: Goal: Mental status will improve Outcome: Progressing   Problem: Activity: Goal: Interest or engagement in activities will improve Outcome: Progressing

## 2024-01-03 NOTE — [facility] Group Notes (Signed)
 [facility] Group Notes:  (Nursing/MHT/Case Management/Adjunct)  Date:  01/03/2024  Time:  9:02 PM  Type of Therapy:  AA Group  Participation Level:  Did Not Attend  Participation Quality:    Affect:    Cognitive:    Insight:    Engagement in Group:    Modes of Intervention:    Summary of Progress/Problems:  Benjamin [name] [place] 01/03/2024, 9:02 PM

## 2024-01-03 NOTE — Progress Notes (Signed)
°   01/02/24 2100  Psych Admission Type (Psych Patients Only)  Admission Status Involuntary  Psychosocial Assessment  Patient Complaints Anxiety  Eye Contact Fair  Facial Expression Anxious  Affect Appropriate to circumstance  Speech Logical/coherent  Interaction Assertive  Motor Activity Slow  Appearance/Hygiene Disheveled;Body odor  Behavior Characteristics Cooperative;Calm  Mood Preoccupied;Anxious  Thought Process  Coherency WDL  Content WDL  Delusions None reported or observed  Perception WDL  Hallucination None reported or observed  Judgment Poor  Confusion None  Danger to Self  Current suicidal ideation? Denies  Agreement Not to Harm Self Yes  Description of Agreement Verbal  Danger to Others  Danger to Others None reported or observed

## 2024-01-03 NOTE — Group Note (Signed)
 Date:  01/03/2024 Time:  3:52 PM  Group Topic/Focus: Occupational Therapy    Pt did not attend occupational therapy group  Benjamin [name] R [name] 01/03/2024, 3:52 PM

## 2024-01-03 NOTE — Progress Notes (Signed)
°   01/03/24 0823  Psych Admission Type (Psych Patients Only)  Admission Status Involuntary  Psychosocial Assessment  Patient Complaints Anxiety  Eye Contact Fair  Facial Expression Anxious  Affect Appropriate to circumstance  Speech Logical/coherent  Interaction Assertive  Motor Activity Slow  Appearance/Hygiene Disheveled  Behavior Characteristics Appropriate to situation;Cooperative  Mood Preoccupied  Aggressive Behavior  Targets Other (Comment) (na)  Type of Behavior Other (Comment) (na)  Effect No apparent injury  Thought Process  Coherency WDL  Content WDL  Delusions None reported or observed  Perception WDL  Hallucination None reported or observed  Judgment Poor  Confusion None  Danger to Self  Current suicidal ideation? Denies  Description of Suicide Plan na  Self-Injurious Behavior No self-injurious ideation or behavior indicators observed or expressed   Agreement Not to Harm Self Yes  Description of Agreement verbal  Danger to Others  Danger to Others None reported or observed  Danger to Others Abnormal  Harmful Behavior to others No threats or harm toward other people  Destructive Behavior No threats or harm toward property

## 2024-01-03 NOTE — Plan of Care (Signed)
   Problem: Education: Goal: Emotional status will improve Outcome: Progressing Goal: Mental status will improve Outcome: Progressing Goal: Verbalization of understanding the information provided will improve Outcome: Progressing

## 2024-01-03 NOTE — Group Note (Signed)
 Date:  01/03/2024 Time:  9:38 AM  Group Topic/Focus:  Goals Group:   The focus of this group is to help patients establish daily goals to achieve during treatment and discuss how the patient can incorporate goal setting into their daily lives to aide in recovery. Orientation:   The focus of this group is to educate the patient on the purpose and policies of crisis stabilization and provide a format to answer questions about their admission.  The group details unit policies and expectations of patients while admitted.    Participation Level:  Did Not Attend   Benjamin [name] 01/03/2024, 9:38 AM

## 2024-01-03 NOTE — Group Note (Signed)
 Date:  01/03/2024 Time:  10:12 AM  Group Topic/Focus: Recreational Therapy    Pt did not attend recreational therapy group  [name] R Benjamin [name] 01/03/2024, 10:12 AM
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
## 2024-01-03 NOTE — Group Note (Signed)
 Date:  01/03/2024 Time:  3:07 PM  Group Topic/Focus: Grief and Loss Group    Pt did not attend grief and loss group with the chaplain  Codee Bloodworth R Mattison Stuckey 01/03/2024, 3:07 PM

## 2024-01-03 NOTE — Progress Notes (Signed)
 Benjamin [name]   Type of Note: Shelter List   Pt was given list of shelters in the area and was encouraged to call. Pt stated I won't be going to a shelter. Pt also stating he is having trouble seeing and the staff is aware, mentioned again to RN.   Signed:  Glover Capano, LCSW-A 01/03/2024  1:32 PM

## 2024-01-03 NOTE — BH IP Treatment Plan (Signed)
 Interdisciplinary Treatment and Diagnostic Plan Update  01/03/2024 Time of Session: 11:05 AM Benjamin [name] MRN: [id number]  Principal Diagnosis: Psychoses (HCC)  Secondary Diagnoses: Principal Problem:   Psychoses (HCC)   Current Medications:  Current Facility-Administered Medications  Medication Dose Route Frequency Provider Last Rate Last Admin   acetaminophen  (TYLENOL ) tablet 650 mg  650 mg Oral Q6H PRN Bobbitt, Shalon E, NP       alum & mag hydroxide-simeth (MAALOX/MYLANTA) 200-200-20 MG/5ML suspension 30 mL  30 mL Oral Q4H PRN Bobbitt, Shalon E, NP       haloperidol  (HALDOL ) tablet 5 mg  5 mg Oral TID PRN Bobbitt, Shalon E, NP       And   diphenhydrAMINE  (BENADRYL ) capsule 50 mg  50 mg Oral TID PRN Bobbitt, Shalon E, NP       haloperidol  lactate (HALDOL ) injection 5 mg  5 mg Intramuscular TID PRN Bobbitt, Shalon E, NP       And   diphenhydrAMINE  (BENADRYL ) injection 50 mg  50 mg Intramuscular TID PRN Bobbitt, Shalon E, NP       And   LORazepam  (ATIVAN ) injection 2 mg  2 mg Intramuscular TID PRN Bobbitt, Shalon E, NP       haloperidol  lactate (HALDOL ) injection 10 mg  10 mg Intramuscular TID PRN Bobbitt, Shalon E, NP       And   diphenhydrAMINE  (BENADRYL ) injection 50 mg  50 mg Intramuscular TID PRN Bobbitt, Shalon E, NP       And   LORazepam  (ATIVAN ) injection 2 mg  2 mg Intramuscular TID PRN Bobbitt, Shalon E, NP       hydrOXYzine  (ATARAX ) tablet 25 mg  25 mg Oral TID PRN Bobbitt, Shalon E, NP   25 mg at 01/02/24 2100   magnesium  hydroxide (MILK OF MAGNESIA) suspension 30 mL  30 mL Oral Daily PRN Bobbitt, Shalon E, NP       naphazoline-glycerin  (CLEAR EYES REDNESS) ophth solution 1-2 drop  1-2 drop Both Eyes QID PRN Faunce, Alina, DO       nicotine  (NICODERM CQ  - dosed in mg/24 hours) patch 21 mg  21 mg Transdermal Daily White, Patrice L, NP   21 mg at 01/03/24 9061   OLANZapine  (ZYPREXA ) tablet 5 mg  5 mg Oral BID Faunce, Alina, DO   5 mg at 01/03/24 0938   traZODone   (DESYREL ) tablet 50 mg  50 mg Oral QHS PRN Bobbitt, Shalon E, NP   50 mg at 01/02/24 2100   PTA Medications: No medications prior to admission.    Patient Stressors: Legal issue    Patient Strengths: Education Administrator   Treatment Modalities: Medication Management, Group therapy, Case management,  1 to 1 session with clinician, Psychoeducation, Recreational therapy.   Physician Treatment Plan for Primary Diagnosis: Psychoses (HCC) Long Term Goal(s):     Short Term Goals: Ability to verbalize feelings will improve  Medication Management: Evaluate patient's response, side effects, and tolerance of medication regimen.  Therapeutic Interventions: 1 to 1 sessions, Unit Group sessions and Medication administration.  Evaluation of Outcomes: Not Progressing  Physician Treatment Plan for Secondary Diagnosis: Principal Problem:   Psychoses (HCC)  Long Term Goal(s):     Short Term Goals: Ability to verbalize feelings will improve     Medication Management: Evaluate patient's response, side effects, and tolerance of medication regimen.  Therapeutic Interventions: 1 to 1 sessions, Unit Group sessions and Medication administration.  Evaluation of Outcomes: Not Progressing  RN Treatment Plan for Primary Diagnosis: Psychoses (HCC) Long Term Goal(s): Knowledge of disease and therapeutic regimen to maintain health will improve  Short Term Goals: Ability to remain free from injury will improve, Ability to verbalize frustration and anger appropriately will improve, Ability to demonstrate self-control, Ability to participate in decision making will improve, Ability to verbalize feelings will improve, Ability to disclose and discuss suicidal ideas, Ability to identify and develop effective coping behaviors will improve, and Compliance with prescribed medications will improve  Medication Management: RN will administer medications as ordered by provider, will assess and  evaluate patient's response and provide education to patient for prescribed medication. RN will report any adverse and/or side effects to prescribing provider.  Therapeutic Interventions: 1 on 1 counseling sessions, Psychoeducation, Medication administration, Evaluate responses to treatment, Monitor vital signs and CBGs as ordered, Perform/monitor CIWA, COWS, AIMS and Fall Risk screenings as ordered, Perform wound care treatments as ordered.  Evaluation of Outcomes: Not Progressing   LCSW Treatment Plan for Primary Diagnosis: Psychoses (HCC) Long Term Goal(s): Safe transition to appropriate next level of care at discharge, Engage patient in therapeutic group addressing interpersonal concerns.  Short Term Goals: Engage patient in aftercare planning with referrals and resources, Increase social support, Increase ability to appropriately verbalize feelings, Increase emotional regulation, Facilitate acceptance of mental health diagnosis and concerns, Facilitate patient progression through stages of change regarding substance use diagnoses and concerns, Identify triggers associated with mental health/substance abuse issues, and Increase skills for wellness and recovery  Therapeutic Interventions: Assess for all discharge needs, 1 to 1 time with Social worker, Explore available resources and support systems, Assess for adequacy in community support network, Educate family and significant other(s) on suicide prevention, Complete Psychosocial Assessment, Interpersonal group therapy.  Evaluation of Outcomes: Not Progressing   Progress in Treatment: Attending groups: No. Participating in groups: No. Taking medication as prescribed: Yes. Toleration medication: Yes. Family/Significant other contact made: No, will contact:  patient declined consents. Patient understands diagnosis: No. Discussing patient identified problems/goals with staff: Yes. Medical problems stabilized or resolved: Yes. Denies  suicidal/homicidal ideation: Yes. Issues/concerns per patient self-inventory: No. None reported.  New problem(s) identified: No, Describe:  None identified.  New Short Term/Long Term Goal(s):medication stabilization, elimination of SI thoughts, development of comprehensive mental wellness plan.    Patient Goals:  I really don't know  Discharge Plan or Barriers: Patient recently admitted. CSW will continue to follow and assess for appropriate referrals and possible discharge planning.    Reason for Continuation of Hospitalization: Delusions  Medication stabilization  Estimated Length of Stay: 3-5 days.  Last 3 [place] Suicide Severity Risk Score: Flowsheet Row Admission (Current) [facility] from 12/31/2023 in [facility] Department at [facility] ED from 03/08/2021 in [facility] Emergency Department at [facility]  C-SSRS RISK CATEGORY No Risk No Risk No Risk    Last [facility] 2/9 Scores:     No data to display          Scribe for Treatment Team: Song Garris  Nunez-Uva, LCSWA 01/03/2024 3:03 PM
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
## 2024-01-03 NOTE — Progress Notes (Signed)
(  Sleep Hours) - 13.25 (Any PRNs that were needed, meds refused, or side effects to meds)- PRN vistaril  25 mg and trazodone  50 mg given at pt request, no meds refused.  (Any disturbances and when (visitation, over night)- None  (Concerns raised by the patient)- None  (SI/HI/AVH)- Denies SI/HI/AVH

## 2024-01-03 NOTE — Group Note (Signed)
 Date:  01/03/2024 Time:  12:49 PM  Group Topic/Focus:  Emotional Wellness: explore and understand the deeper layers of anger by using the anger iceberg model. This metaphor helps us  recognize that anger, like an iceberg, often only shows a small portion of what's truly beneath the surface. By delving into this concept, participants will develop a more nuanced view of their emotional experiences, enhancing self-awareness and promoting healthier emotional expression. Physical Wellness: educate participants on the vital role that sleep plays in overall physical wellness by watching a TED Talk on the science of sleep and engaging in a thoughtful discussion afterward. The session will focus on how sleep impacts various aspects of health, including cognitive function, physical recovery, immune health, and emotional well-being, and how participants can incorporate better sleep practices into their lives to improve their physical wellness.  Participation Level:  Did Not Attend  Jodeen Mclin R Tamika Nou 01/03/2024, 12:49 PM

## 2024-01-04 MED ORDER — SODIUM CHLORIDE 0.9 % IN NEBU
INHALATION_SOLUTION | RESPIRATORY_TRACT | Status: AC
  Administered 2024-01-04: 17:00:00 5 mL
  Filled 2024-01-04: qty 3

## 2024-01-04 MED ORDER — SODIUM CHLORIDE 0.9 % IN NEBU
INHALATION_SOLUTION | RESPIRATORY_TRACT | Status: AC
  Filled 2024-01-04: qty 3

## 2024-01-04 NOTE — Group Note (Signed)
 Date:  01/04/2024 Time:  9:58 AM  Group Topic/Focus: Goals orientation Goals Group:   The focus of this group is to help patients establish daily goals to achieve during treatment and discuss how the patient can incorporate goal setting into their daily lives to aide in recovery. Orientation:   The focus of this group is to educate the patient on the purpose and policies of crisis stabilization and provide a format to answer questions about their admission.  The group details unit policies and expectations of patients while admitted.    Participation Level:  Did Not Attend   Benjamin [name] 01/04/2024, 9:58 AM

## 2024-01-04 NOTE — [facility] Group Notes (Signed)
 Adult Psychoeducational Group Note  Date:  01/04/2024 Time:  9:01 PM  Group Topic/Focus:  Wrap-Up Group:   The focus of this group is to help patients review their daily goal of treatment and discuss progress on daily workbooks.  Participation Level:  Did Not Attend  Aisha Celestine Ruth 01/04/2024, 9:01 PM

## 2024-01-04 NOTE — Group Note (Signed)
 Date:  01/04/2024 Time:  4:07 PM  Group Topic/Focus: social work group on grief Social work with grieving adults in mental health settings focuses on supporting individuals as they cope with loss while addressing the emotional, psychological, and social impacts of grief. Social workers assess the nature of the loss, the clients coping abilities, support systems, and any co-occurring mental health concerns such as depression or anxiety. Using a strengths-based and culturally sensitive approach, they provide emotional support, normalize grief reactions, offer psychoeducation, and apply therapeutic interventions to help clients process their feelings and adjust to life changes. The goal is not to eliminate grief, but to promote healthy adaptation, resilience, and improved mental well-being.    Participation Level:  Did Not Attend   Benjamin [name] 01/04/2024, 4:07 PM

## 2024-01-04 NOTE — Progress Notes (Signed)
°   01/03/24 2115  Psych Admission Type (Psych Patients Only)  Admission Status Involuntary  Psychosocial Assessment  Patient Complaints None  Eye Contact Fair  Facial Expression Anxious  Affect Appropriate to circumstance  Speech Logical/coherent  Interaction Assertive  Motor Activity Slow  Appearance/Hygiene Disheveled;Body odor  Behavior Characteristics Cooperative  Mood Preoccupied  Thought Process  Coherency WDL  Content WDL  Delusions None reported or observed  Perception WDL  Hallucination None reported or observed  Judgment Poor  Confusion None  Danger to Self  Current suicidal ideation? Denies  Agreement Not to Harm Self Yes  Description of Agreement Verbal  Danger to Others  Danger to Others None reported or observed

## 2024-01-04 NOTE — Progress Notes (Signed)
 [facility] Inpatient Psychiatry Progress Note  Date: 01/04/2024 Patient: Benjamin [name] MRN: [id number]  ASSESSMENT: This patient's presentation is consistent with acute destabilization of schizophrenia, some other unspecified psychosis, or generalized disorganization from unknown cognitive impairment. Patient is not currently hallucinating or displaying negative symptoms, though he is exhibiting some paranoid delusions of helicopters circling his home, and could potentially be a high functioning, low symptomology case of schizophrenia. Patient will typically slide into disorganized thought content as conversation progresses. Patient continues to exhibits some disjointed, somewhat pressured irritability when discussing paranoid delusions.    12/16: Patient's presentation is unchanged from admission, not outwardly hallucinating or with negative symptoms though continues to express paranoid delusions. Given this patients consistent presentation despite titration of antipsychotics, currently considering major neurocognitive dysfunction highest on the differential.  CT scan from 2021 demonstrates some diffuse frontal and temporal lobe cortical atrophy and expansion of sulci, indicative of neurocognitive decline.  Will continue Zyprexa  as scheduled for management of ongoing paranoid and bizarre delusions though prognosis for patients psychosis is poor.  Patient will likely need dispo to shelter upon discharge from this hospitalization, likely towards the middle to end of the week.  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 Active Medical Issues #Nicotine  withdrawal - Patient in need of nicotine  replacement;  nicotine  patch 21 mg / 24 hours ordered. Smoking cessation encouraged  #eye irritation - Eyedrops as needed - Nursing order for contact lens removal and saline flush   Other as needed medications  Tylenol  650 mg every 6 hours as needed for pain Mylanta 30 mL every 4 hours as needed for indigestion Milk of magnesia 30 mL daily as needed for constipation  Risk Assessment: Patient continues to require inpatient hospitalization for safety and stabilization of acute psychosis.  Discharge Planning: Barriers to Discharge: Psychosis Estimated Length of Stay: 2-4 days Predicted Discharge Location: TBD, likely shelter  INTERVAL HISTORY: Chart reviewed, no new events overnight and patient's behaviors have been appropriate.  Patient continues to express paranoid and bizarre delusions, though these are not distressing to him and patient is more concerned with his somatic complaints.  Patient continues to endorse blurry vision, though denies pain, itching, irritation while wearing his contacts.  Patient has not been attending groups because his back hurts and he does not feel that he needs to be here.  Patient continues to deny SI/HI/AVH and has been medication compliant.  PRN's in the last 24 hours include Vistaril  and trazodone   Physical Examination:  Vitals and nursing note reviewed MSK: Normal gait and station Eyes: No injected conjunctiva noted, contact lenses appear dirty  MENTAL STATUS EXAM:  Appearance: Disheveled, appears older than stated age, poor dental hygiene, laying in bed with covers over himself  Behavior: No psychomotor agitation noted, poor eye contact  Attitude: calm  Speech: Normal rate, rhythm, tone  Mood: Irritable  Affect: UTA, covering face  Thought Process: linear, GD  Thought Content: bizarre, paranoid delusions  SI/HI: denies  Perceptions: denies AVH, does not appear to be responding to internal stimuli  Judgement: poor  Insight: poor  Fund of Knowledge: WNL    Lab Results:  Admission on 01/01/2024  Component Date Value Ref Range Status   TSH 01/02/2024  1.330  0.350 - 4.500 uIU/mL Final   Hgb A1c MFr Bld 01/02/2024 4.8  4.8 - 5.6 % Final   Mean Plasma Glucose 01/02/2024 91.06  mg/dL Final   Cholesterol 87/85/7974 153  0 - 200 mg/dL Final   Triglycerides 87/85/7974 175 (H)  <150 mg/dL Final   HDL 87/85/7974 29 (L)  >40 mg/dL Final   Total CHOL/HDL Ratio 01/02/2024 5.2  RATIO Final   VLDL 01/02/2024 35  0 - 40 mg/dL Final   LDL Cholesterol 01/02/2024 89  0 - 99 mg/dL Final     Vitals: Blood pressure 127/73, pulse 94, temperature 97.9 F (36.6 C), temperature source Oral, resp. rate 20, height 5' 7 (1.702 m), weight 61.2 kg, SpO2 99%.   Alfornia Light, DO PGY-1, Psychiatry Residency  01/04/2024, 7:23 AM

## 2024-01-04 NOTE — Group Note (Signed)
 Date:  01/04/2024 Time:  11:28 AM  Group Topic/Focus: Holiday Music Holiday music therapy supports adult mental health by reducing stress and anxiety, improving mood, fostering emotional expression, and easing loneliness during a season that can intensify both joy and distress. Through guided listening, gentle singing, lyric discussion, or simple instrument play, adults can process emotions, access positive memories, and experience grounding and connection in a nonverbal, low-pressure way. When used with choice and cultural sensitivity, holiday music therapy is especially helpful for adults coping with depression, anxiety, grief, trauma, or social isolation, making it a valuable, trauma-informed therapeutic approach during the holidays.    Participation Level:  Did Not Attend   Sharelle laws 01/04/2024, 11:28 AM

## 2024-01-04 NOTE — Plan of Care (Addendum)
 Pt reports he slept well last night with good appetite. Concrete on interaction. Denies SI, HI and AVH when assessed No, I don't have that problem. I'm not angry, I'm not sad. Received PRN Tylenol  for complain of back pain and eye pain 8/10. Left eye appears red, itchy but pt refused to take his contacts off no, I don't want to take them off. I can't see good without it. Per pt It started a week ago before I came in, I can't really see good. Unable to give information about optometrist information I don't know. Tolerates meals, fluids and medications well. Eyes cleaned with saline and drops applied as ordered. Emotional support, encouragement and reassurance offered. Safety maintained at Q 15 minutes intervals without self harm gestures.  Problem: Coping: Goal: Ability to verbalize frustrations and anger appropriately will improve Outcome: Progressing   Problem: Safety: Goal: Periods of time without injury will increase Outcome: Progressing

## 2024-01-04 NOTE — Plan of Care (Signed)
   Problem: Education: Goal: Emotional status will improve Outcome: Progressing Goal: Mental status will improve Outcome: Progressing Goal: Verbalization of understanding the information provided will improve Outcome: Progressing   Problem: Activity: Goal: Interest or engagement in activities will improve Outcome: Progressing

## 2024-01-04 NOTE — [facility] Suicide Risk Assessment (Signed)
 [facility] INPATIENT:  Family/Significant Other Suicide Prevention Education  Suicide Prevention Education:  Patient Refusal for Family/Significant Other Suicide Prevention Education: The patient Benjamin [name] has refused to provide written consent for family/significant other to be provided Family/Significant Other Suicide Prevention Education during admission and/or prior to discharge.  Physician notified.  [name] 01/04/2024, 11:24 AM

## 2024-01-04 NOTE — Group Note (Signed)
 Recreation Therapy Group Note   Group Topic:Animal Assisted Therapy   Group Date: 01/04/2024 Start Time: 0945 End Time: 1030 Facilitators: Pauline Trainer-McCall, LRT,CTRS Location: [address] [employer]   AAA/T Program Assumption of Risk Form signed by Patient/ or Parent Legal Guardian Yes  Patient understands his/her participation is voluntary Yes  Behavioral Response:    Education: Charity Fundraiser, Appropriate Animal Interaction   Education Outcome: Acknowledges education.    Clinical Observations/Individualized Feedback: Pet therapy didn't take place due medical situation with handlers older dog.      Plan: Continue to engage patient in RT group sessions 2-3x/week.   Benjamin [name], LRT,CTRS  01/04/2024 11:54 AM

## 2024-01-04 NOTE — Group Note (Signed)
 Date:  01/04/2024 Time:  3:51 PM  Group Topic/Focus: Sleep Hygiene Dimensions of Wellness:   The focus of this group is to introduce the topic of wellness and discuss the role each dimension of wellness plays in total health.    Participation Level:  Did Not Attend  Benjamin [name] 01/04/2024, 3:51 PM

## 2024-01-04 NOTE — Group Note (Signed)
 LCSW Group Therapy Note   Group Date: 01/04/2024 Start Time: 1100 End Time: 1200   Participation:  did not attend  Type of Therapy:  Group Therapy  Topic:  Healing Hearts:  A Safe Space for Grief     Objective:   The objective of this class is to create a compassionate environment where participants can process their grief, explore different stages of grief, and discover ways to honor their loved ones through personal rituals.  3 Goals: Provide a safe and supportive space where participants feel comfortable sharing their feelings and experiences of grief without judgment. Educate participants about the stages of grief and emphasize that there is no right way to grieve or a fixed timeline for healing. Introduce the concept of rituals as a means to process grief, allowing individuals to honor their loved ones in a personal and meaningful way.  Summary:  In Healing Hearts: A Safe Space for Grief, we explored the unique and personal journey of grief, emphasizing that everyone experiences it differently.  We discussed the five stages of grief (denial, anger, bargaining, depression, and acceptance), with the understanding that grief is not linear.  Rituals were introduced as a way to help cope with loss, offering comfort and connection through meaningful actions such as lighting candles or taking memory walks. Participants were encouraged to express their emotions, focus on self-care, and reflect on moments of gratitude for their loved ones, recognizing that healing is a process and there is no timeline for grief.  Therapeutic Modalities: Elements of CBT: Challenge thoughts, reframe beliefs, self-compassion Elements of DBT: Mindfulness, distress tolerance, emotion regulation Supportive Therapy:  Provide validation, foster a safe and supportive group environment, normalize grief    Benjamin [name], LCSWA 01/04/2024  12:27 PM

## 2024-01-04 NOTE — Progress Notes (Signed)
(  Sleep Hours) - 12 (Any PRNs that were needed, meds refused, or side effects to meds)- PRN vistaril  25 mg and trazodone  50 mg given, no meds refused.  (Any disturbances and when (visitation, over night)- None  (Concerns raised by the patient)- None  (SI/HI/AVH)- Denies SI/HI/AVH

## 2024-01-05 NOTE — Group Note (Signed)
 Date:  01/05/2024 Time:  12:45 PM  Group Topic/Focus: Pharmacy    Pt did not attend pharmacy group  Johnta Couts R Makail Watling 01/05/2024, 12:45 PM

## 2024-01-05 NOTE — BH Assessment (Signed)
(  Sleep Hours) - 9.25 (Any PRNs that were needed, meds refused, or side effects to meds)-  (Any disturbances and when (visitation, over night)- (Concerns raised by the patient)- Contacts remain in his eyes. Pt educated on risk of leaving contacts in eyes with eyes irritated.   (SI/HI/AVH)- Denies

## 2024-01-05 NOTE — Progress Notes (Signed)
 [facility] Inpatient Psychiatry Progress Note  Date: 01/05/2024 Patient: Benjamin [name] MRN: [id number]  ASSESSMENT: This patient's presentation is consistent with acute destabilization of schizophrenia, some other unspecified psychosis, or generalized disorganization from unknown cognitive impairment. Patient is not currently hallucinating or displaying negative symptoms, though he is exhibiting some paranoid delusions of helicopters circling his home, and could potentially be a high functioning, low symptomology case of schizophrenia. Patient will typically slide into disorganized thought content as conversation progresses. Given this patients consistent presentation despite titration of antipsychotics, currently considering major neurocognitive dysfunction highest on the differential.  CT scan from 2021 demonstrates some diffuse frontal and temporal lobe cortical atrophy and expansion of sulci, indicative of neurocognitive decline.    12/17: Patient's presentation is unchanged from yesterday, other than improved vision after removing contact lenses. Will continue Zyprexa  as scheduled for management of ongoing paranoid and bizarre delusions though prognosis for patients psychosis is poor.  Patient will likely be able to discharge to his mother's home at the end of the week once follow-up appointments have been arranged.  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 Active Medical Issues #Nicotine  withdrawal - Patient in need of nicotine  replacement; nicotine  patch 21 mg / 24 hours ordered. Smoking cessation encouraged  #eye irritation - Eyedrops as needed - Nursing order for contact lens removal and  saline flush   Other as needed medications  Tylenol  650 mg every 6 hours as needed for pain Mylanta 30 mL every 4 hours as needed for indigestion Milk of magnesia 30 mL daily as needed for constipation  Risk Assessment: Patient continues to require inpatient hospitalization for safety and stabilization of acute psychosis.  Discharge Planning: Barriers to Discharge: Psychosis Estimated Length of Stay: 2 days Predicted Discharge Location: Home versus shelter  INTERVAL HISTORY: Chart reviewed, no new events overnight and patient's behaviors have been appropriate, VSS. Patient continues to deny SI/HI/AVH and has been medication compliant.  Patient reports improvement in his vision after removing his contact lenses, though says that he wishes his contact lenses were still in so his vision was not so blurry.  Patient plans to discuss with his mother and family if he is able to return to their home upon discharge.  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  Spoke with patient's mother, Benjamin [name] [phone] to discuss discharge planning.  Benjamin [name] says he's got no where else to go when asked if she is comfortable with patient going home with her.  I asked Benjamin [name] if she feels safe with the patient going home, and she says not really but I can deal with it.  I then asked specifically if she has any concerns with patient hurting himself, hurting her, or hurting anyone else, to which she replies I don't think so.  PRN's in the last 24 hours include Vistaril , eyedrops, Tylenol  and trazodone   Physical Examination:  Vitals and nursing note reviewed MSK: Normal gait and station Eyes: Small area of injected conjunctiva in medial left eye, no contacts, appear cleared from yesterday  MENTAL STATUS EXAM:  Appearance: Disheveled, appears older than stated age, poor dental hygiene, laying  in bed with  covers over himself  Behavior: No psychomotor agitation noted, poor eye contact  Attitude: calm  Speech: Normal rate, rhythm, tone  Mood: Euthymic  Affect: Restricted  Thought Process: linear, GD  Thought Content: Perseverative on family's methamphetamine use  SI/HI: denies  Perceptions: denies AVH, does not appear to be responding to internal stimuli  Judgement: poor  Insight: poor  Fund of Knowledge: WNL   Lab Results:  Admission on 01/01/2024  Component Date Value Ref Range Status   TSH 01/02/2024 1.330  0.350 - 4.500 uIU/mL Final   Hgb A1c MFr Bld 01/02/2024 4.8  4.8 - 5.6 % Final   Mean Plasma Glucose 01/02/2024 91.06  mg/dL Final   Cholesterol 87/85/7974 153  0 - 200 mg/dL Final   Triglycerides 87/85/7974 175 (H)  <150 mg/dL Final   HDL 87/85/7974 29 (L)  >40 mg/dL Final   Total CHOL/HDL Ratio 01/02/2024 5.2  RATIO Final   VLDL 01/02/2024 35  0 - 40 mg/dL Final   LDL Cholesterol 01/02/2024 89  0 - 99 mg/dL Final     Vitals: Blood pressure 127/73, pulse 94, temperature 97.9 F (36.6 C), temperature source Oral, resp. rate 20, height 5' 7 (1.702 m), weight 61.2 kg, SpO2 99%.   Alfornia Light, DO PGY-1, Psychiatry Residency  01/05/2024, 1:16 PM

## 2024-01-05 NOTE — Progress Notes (Signed)
(  Sleep Hours) -  (Any PRNs that were needed, meds refused, or side effects to meds)- hydroxyzine  25mg . Trazodone  50mg   (Any disturbances and when (visitation, over night)- none  (Concerns raised by the patient)- none  (SI/HI/AVH)- denies

## 2024-01-05 NOTE — Plan of Care (Signed)
  Problem: Education: Goal: Knowledge of [facility] Education information/materials will improve Outcome: Progressing Goal: Emotional status will improve Outcome: Progressing Goal: Mental status will improve Outcome: Progressing Goal: Verbalization of understanding the information provided will improve Outcome: Progressing   Problem: Activity: Goal: Interest or engagement in activities will improve Outcome: Progressing Goal: Sleeping patterns will improve Outcome: Progressing   Problem: Health Behavior/Discharge Planning: Goal: Identification of resources available to assist in meeting health care needs will improve Outcome: Progressing Goal: Compliance with treatment plan for underlying cause of condition will improve Outcome: Progressing   Problem: Safety: Goal: Periods of time without injury will increase Outcome: Progressing

## 2024-01-05 NOTE — Progress Notes (Signed)
 [facility] Inpatient Psychiatry Progress Note  Date: 01/05/2024 Patient: Benjamin [name] MRN: [id number]  ASSESSMENT: This patient's presentation is consistent with acute destabilization of schizophrenia, some other unspecified psychosis, or generalized disorganization from unknown cognitive impairment. Patient is not currently hallucinating or displaying negative symptoms, though he is exhibiting some paranoid delusions of helicopters circling his home, and could potentially be a high functioning, low symptomology case of schizophrenia. Patient will typically slide into disorganized thought content as conversation progresses. Given this patients consistent presentation despite titration of antipsychotics, currently considering major neurocognitive dysfunction highest on the differential.  CT scan from 2021 demonstrates some diffuse frontal and temporal lobe cortical atrophy and expansion of sulci, indicative of neurocognitive decline.  MoCA assessment performed yesterday with score of 18 indicative of mild cognitive impairment, and warrants further outpatient neuropsychiatric evaluation to help guide further clinical management.  12/18: Patient's presentation is unchanged from yesterday, other than improved vision after removing contact lenses. Will continue Zyprexa  as scheduled for management of ongoing paranoid and bizarre delusions though prognosis for patients psychosis is poor.  Patient will likely be able to discharge to his mother's home at the end of the week once follow-up appointments have been arranged.  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 Active Medical Issues #Nicotine  withdrawal - Patient in need of  nicotine  replacement; nicotine  patch 21 mg / 24 hours ordered. Smoking cessation encouraged  #eye irritation - Eyedrops as needed - Nursing order for contact lens removal and saline flush   Other as needed medications  Tylenol  650 mg every 6 hours as needed for pain Mylanta 30 mL every 4 hours as needed for indigestion Milk of magnesia 30 mL daily as needed for constipation  Risk Assessment: Patient continues to require inpatient hospitalization for safety and stabilization of acute psychosis.  Discharge Planning: Barriers to Discharge: Psychosis Estimated Length of Stay: 2 days Predicted Discharge Location: Home versus shelter  INTERVAL HISTORY: Chart reviewed, no new events overnight and patient's behaviors have been appropriate, VSS. Patient continues to deny SI/HI/AVH and has been medication compliant.  Patient reports improvement in his vision after removing his contact lenses, though says that he wishes his contact lenses were still in so his vision was not so blurry.  Patient plans to discuss with his mother and family if he is able to return to their home upon discharge.  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  Spoke with patient's mother, Alisa [phone] to discuss discharge planning.  Alisa says he's got no where else to go when asked if she is comfortable with patient going home with her.  I asked Alisa if she feels safe with the patient going home, and she says not really but I can deal with it.  I then asked specifically if she has any concerns with patient hurting himself, hurting her, or hurting anyone else, to which she replies I don't think so.  PRN's in the last 24 hours include Vistaril , eyedrops, Tylenol  and trazodone   Physical Examination:  Vitals and nursing note reviewed MSK: Normal gait and station Eyes: Small area of injected conjunctiva in medial  left  eye, no contacts, appear cleared from yesterday  MENTAL STATUS EXAM:  Appearance: Disheveled, appears older than stated age, poor dental hygiene, laying in bed with covers over himself  Behavior: No psychomotor agitation noted, poor eye contact  Attitude: calm  Speech: Normal rate, rhythm, tone  Mood: Euthymic  Affect: Restricted  Thought Process: linear, GD  Thought Content: Perseverative on family's methamphetamine use  SI/HI: denies  Perceptions: denies AVH, does not appear to be responding to internal stimuli  Judgement: poor  Insight: poor  Fund of Knowledge: WNL   Lab Results:  Admission on 01/01/2024  Component Date Value Ref Range Status   TSH 01/02/2024 1.330  0.350 - 4.500 uIU/mL Final   Hgb A1c MFr Bld 01/02/2024 4.8  4.8 - 5.6 % Final   Mean Plasma Glucose 01/02/2024 91.06  mg/dL Final   Cholesterol 87/85/7974 153  0 - 200 mg/dL Final   Triglycerides 87/85/7974 175 (H)  <150 mg/dL Final   HDL 87/85/7974 29 (L)  >40 mg/dL Final   Total CHOL/HDL Ratio 01/02/2024 5.2  RATIO Final   VLDL 01/02/2024 35  0 - 40 mg/dL Final   LDL Cholesterol 01/02/2024 89  0 - 99 mg/dL Final     Vitals: Blood pressure (!) 127/91, pulse 66, temperature 97.8 F (36.6 C), temperature source Oral, resp. rate 18, height 5' 7 (1.702 m), weight 61.2 kg, SpO2 100%.   Alfornia Light, DO PGY-1, Psychiatry Residency  01/05/2024, 9:07 PM

## 2024-01-05 NOTE — Plan of Care (Signed)
   Problem: Health Behavior/Discharge Planning: Goal: Compliance with treatment plan for underlying cause of condition will improve Outcome: Progressing   Problem: Safety: Goal: Periods of time without injury will increase Outcome: Progressing

## 2024-01-05 NOTE — Group Note (Signed)
 Date:  01/05/2024 Time:  9:57 AM  Group Topic/Focus:  Goals Group:   The focus of this group is to help patients establish daily goals to achieve during treatment and discuss how the patient can incorporate goal setting into their daily lives to aide in recovery. Orientation:   The focus of this group is to educate the patient on the purpose and policies of crisis stabilization and provide a format to answer questions about their admission.  The group details unit policies and expectations of patients while admitted.    Participation Level:  Did Not Attend   Benjamin [name] 01/05/2024, 9:57 AM

## 2024-01-05 NOTE — Plan of Care (Signed)
   Problem: Education: Goal: Knowledge of [facility] Education information/materials will improve Outcome: Progressing Goal: Emotional status will improve Outcome: Progressing Goal: Mental status will improve Outcome: Progressing

## 2024-01-05 NOTE — Progress Notes (Signed)
 Patient presents: Patient remained in bed throughout most of the day. Patient is med compliant. Minimal socialization and interaction but remains cooperative in unit.    SI/HI/AVH: Denies   Plan: Denies   Groups attended: 0/6   Appetite: Adequate. Attended meals.   Sleep: No sleep disturbances reported.   PRNS: N/A   Disturbances: No disturbances. Patient remains cooperative in milieu.    Questions/concerns: No further questions or concerns.   VS: BP (!) 127/91 (BP Location: Right Arm)   Pulse 66   Temp 97.8 F (36.6 C) (Oral)   Resp 18   Ht 5' 7 (1.702 m)   Wt 61.2 kg   SpO2 100%   BMI 21.14 kg/m

## 2024-01-05 NOTE — Group Note (Signed)
 Recreation Therapy Group Note   Group Topic:Communication  Group Date: 01/05/2024 Start Time: 0935 End Time: 0956 Facilitators: Brookelin Felber-McCall, LRT,CTRS Location: [address] Dayroom   Group Topic: Communication, Team Building, Problem Solving  Goal Area(s) Addresses:  Patient will effectively work with peer towards shared goal.  Patient will identify skills used to make activity successful.  Patient will identify how skills used during activity can be applied to reach post d/c goals.   Behavioral Response:   Intervention: STEM Activity- Glass Blower/designer  Activity: Tallest [employer]. In teams of 5-6, patients were given 11 craft pipe cleaners. Using the materials provided, patients were instructed to compete again the opposing team(s) to build the tallest free-standing structure from floor level. The activity was timed; difficulty increased by clinical research associate as production designer, theatre/television/film continued.  Systematically resources were removed with additional directions for example, placing one arm behind their back, working in silence, and shape stipulations. LRT facilitated post-activity discussion reviewing team processes and necessary communication skills involved in completion. Patients were encouraged to reflect how the skills utilized, or not utilized, in this activity can be incorporated to positively impact support systems post discharge.  Education: Pharmacist, Community, Scientist, Physiological, Discharge Planning   Education Outcome: Acknowledges education/In group clarification offered/Needs additional education.    Affect/Mood: N/A   Participation Level: Did not attend    Clinical Observations/Individualized Feedback:      Plan: Continue to engage patient in RT group sessions 2-3x/week.   Benjamin [name], LRT,CTRS 01/05/2024 12:36 PM

## 2024-01-05 NOTE — Progress Notes (Signed)
 Patient ID: [name], male   DOB: 09/02/1970, 53 y.o.   MRN: [id number]  2010 Pt A/O x4 with noted Psychosis. Able to verbalize needs. Meds taken whole. During med pass Pt educated on current contacts in his eyes. Prior shift NS used to rinse eyes with contacts remaining intact. Pt encouraged to remove the contacts and addressing the redness of his sclera. Pt refused to remove the contacts and clean them. Pt was educated if the contacts weren't removed and cleaned it could cause more serious results. Pt complied with removing the contacts and saying; I can't see with them in but it's worse if I don't have them in. No noted distress. Monitoring continues during 7p-7a shift.

## 2024-01-05 NOTE — Group Note (Signed)
 Date:  01/05/2024 Time:  3:20 PM  Group Topic/Focus: Spiritual Wellness with Chaplain    Pt did not attend spiritual wellness group with the chaplain  Benjamin [name] R Revecca Nachtigal 01/05/2024, 3:20 PM

## 2024-01-05 NOTE — Group Note (Signed)
 Date:  01/05/2024 Time:  3:58 PM  Group Topic/Focus: Motivation/Inspiration/Stages of Change  Stages of Change:   The focus of this group is to explain the stages of change and help patients identify changes they want to make upon discharge.    Participation Level:  Did Not Attend   Benjamin [name] 01/05/2024, 3:58 PM

## 2024-01-05 NOTE — Group Note (Signed)
 Date:  01/05/2024 Time:  10:12 AM  Group Topic/Focus: Recreational Therapy    Pt did not attend recreational therapy group  Benjamin [name] R [name] 01/05/2024, 10:12 AM

## 2024-01-06 DIAGNOSIS — F0390 Unspecified dementia without behavioral disturbance: Secondary | ICD-10-CM

## 2024-01-06 MED ORDER — SODIUM CHLORIDE 0.9 % IN NEBU
INHALATION_SOLUTION | RESPIRATORY_TRACT | Status: AC
  Filled 2024-01-06: qty 6

## 2024-01-06 NOTE — [facility] Suicide Risk Assessment (Signed)
 [facility] INPATIENT:  Family/Significant Other Suicide Prevention Education  Suicide Prevention Education:  Education Completed; Mother, Benjamin [name] [phone],  (name of family member/significant other) has been identified by the patient as the family member/significant other with whom the patient will be residing, and identified as the person(s) who will aid the patient in the event of a mental health crisis (suicidal ideations/suicide attempt).  With written consent from the patient, the family member/significant other has been provided the following suicide prevention education, prior to the and/or following the discharge of the patient.  Mother confirms that patient can return home at discharge tomorrow. Will need a taxi as she does not drive. Confirmed address on file. Weapons/guns/knives have been secured and removed from the home. Mother will be home tomorrow.   The suicide prevention education provided includes the following: Suicide risk factors Suicide prevention and interventions National Suicide Hotline telephone number [facility] assessment telephone number [facility] Emergency Assistance 911 [facility] and/or Residential Mobile Crisis Unit telephone number  Request made of family/significant other to: Remove weapons (e.g., guns, rifles, knives), all items previously/currently identified as safety concern.   Remove drugs/medications (over-the-counter, prescriptions, illicit drugs), all items previously/currently identified as a safety concern.  The family member/significant other verbalizes understanding of the suicide prevention education information provided.  The family member/significant other agrees to remove the items of safety concern listed above.  Benjamin [name] 01/06/2024, 10:41 AM

## 2024-01-06 NOTE — Group Note (Signed)
 LCSW Group Therapy Note   Group Date: 01/06/2024 Start Time: 1100 End Time: 1200   Participation:  did not attend  Type of Therapy:  Group Therapy  Topic:  Speaking from the Heart: Communicating with Understanding and Empathy  Objective:  To help participants develop effective communication skills to express themselves clearly, listen actively, and navigate conflicts in a healthy way.  Goals: Increase awareness of verbal and non-verbal communication skills. Practice using I statements and active listening techniques. Learn coping strategies for managing communication stress.  Summary:  Participants explored the importance of communication, discussed challenges, and practiced skills such as active listening and assertive expression. They reflected on past experiences and identified ways to improve communication in their daily lives.  Therapeutic Modalities: -  Cognitive-Behavioral Therapy (CBT): Restructuring negative thought patterns in communication. -  Mindfulness: Staying present and calm during conversations. -  Psychoeducation: Learning about effective communication techniques.   Kaly Mcquary O Parminder Cupples, LCSWA 01/06/2024  12:39 PM

## 2024-01-06 NOTE — Group Note (Signed)
 Date:  01/06/2024 Time:  10:04 AM  Group Topic/Focus: Goals group  Patients were given two worksheets: a goals worksheet and a list of 50 positive traits. Patients participated in an icebreaker by sharing their name, a desired Christmas gift, and identifying positive traits they align with. They were encouraged to share their responses and goals with the group. The group focused on promoting expanded thinking, social interaction, and positivity.    Participation Level:  Did Not Attend  Participation Quality:  N/A  Affect:  N/A  Cognitive:  N/A  Insight: None  Engagement in Group:  None  Modes of Intervention:  N/A  Additional Comments:  Pt did not attend goals group.  Benjamin [name] 01/06/2024, 10:04 AM

## 2024-01-06 NOTE — Group Note (Signed)
 Date:  01/06/2024 Time:  9:31 PM  Group Topic/Focus:  Wrap-Up Group:   The focus of this group is to help patients review their daily goal of treatment and discuss progress on daily workbooks.    Participation Level:  Did Not Attend  Participation Quality:  Patient did not attend group!  Affect:  Patient did not attend group!  Cognitive:  Patient did not attend group!  Insight: Patient did not attend group!  Engagement in Group:  Patient did not attend group!  Modes of Intervention:  Patient did not attend group!  Additional Comments:  Patient did not attend group! MHT will continue to encourage participation.  Dena JINNY Mace 01/06/2024, 9:31 PM

## 2024-01-06 NOTE — Plan of Care (Signed)
   Problem: Coping: Goal: Ability to verbalize frustrations and anger appropriately will improve Outcome: Progressing Goal: Ability to demonstrate self-control will improve Outcome: Progressing

## 2024-01-06 NOTE — Group Note (Signed)
 Date:  01/06/2024 Time:  2:49 PM  Group Topic/Focus: Music therapy  Patients participated in [place], selecting songs of their choice within appropriate therapeutic boundaries. The activity promoted bonding and social connection among participants.    Participation Level:  Did Not Attend  Participation Quality:  N/A  Affect:  N/A  Cognitive:  N/A  Insight: None  Engagement in Group:  None  Modes of Intervention:  N/A  Additional Comments:  Pt did not attend this music therapy group  Kristi HERO [facility] 01/06/2024, 2:49 PM

## 2024-01-06 NOTE — BHH Group Notes (Signed)
 Pt did not attend the OT group today, 01/06/24 ([id number])

## 2024-01-06 NOTE — BHH Group Notes (Signed)
 Pt did not attend the nutrition group today, 01/06/24 ([id number])

## 2024-01-06 NOTE — BHH Group Notes (Signed)
 Pt did not attend the CSW group today, 01/06/24 ([id number])

## 2024-01-06 NOTE — Progress Notes (Signed)
°   01/06/24 1300  Psych Admission Type (Psych Patients Only)  Admission Status Involuntary  Psychosocial Assessment  Patient Complaints None  Eye Contact Fair  Facial Expression Flat  Affect Flat  Speech Logical/coherent  Interaction Isolative  Motor Activity Slow  Appearance/Hygiene Disheveled  Behavior Characteristics Cooperative  Mood Depressed;Pleasant  Thought Process  Coherency Circumstantial  Content WDL  Delusions None reported or observed  Perception WDL  Hallucination None reported or observed  Judgment Poor  Confusion None  Danger to Self  Current suicidal ideation? Denies  Self-Injurious Behavior No self-injurious ideation or behavior indicators observed or expressed   Danger to Others  Danger to Others None reported or observed  Danger to Others Abnormal  Harmful Behavior to others No threats or harm toward other people  Destructive Behavior No threats or harm toward property

## 2024-01-07 MED ORDER — TRAZODONE HCL 50 MG PO TABS: 50.0000 mg | ORAL_TABLET | Freq: Every evening | ORAL | 0 refills | Status: AC | PRN

## 2024-01-07 MED ORDER — NICOTINE 21 MG/24HR TD PT24: 21.0000 mg | MEDICATED_PATCH | Freq: Every day | TRANSDERMAL | 0 refills | Status: AC

## 2024-01-07 MED ORDER — OLANZAPINE 5 MG PO TABS: 10.0000 mg | ORAL_TABLET | Freq: Every day | ORAL | 0 refills | Status: AC

## 2024-01-07 MED ORDER — HYDROXYZINE HCL 25 MG PO TABS: 25.0000 mg | ORAL_TABLET | Freq: Three times a day (TID) | ORAL | 0 refills | Status: AC | PRN

## 2024-01-07 NOTE — Transportation (Signed)
 01/07/2024  Perfecto MALVA Lunger DOB: 1970-05-01 MRN: [id number]   RIDER WAIVER AND RELEASE OF LIABILITY  For the purposes of helping with transportation needs, [facility] partners with outside transportation providers (taxi companies, [place], catering manager.) to give [facility] patients or other approved people the choice of on-demand rides Public Librarian) to our buildings for non-emergency visits.  By using [employer], I, the person signing this document, on behalf of myself and/or any legal minors (in my care using the [employer]), agree:  Science Writer given to me are supplied by independent, outside transportation providers who do not work for, or have any affiliation with, [employer]. [place] is not a transportation company. [facility] has no control over the quality or safety of the rides I get using [employer]. [place] has no control over whether any outside ride will happen on time or not. [facility] gives no guarantee on the reliability, quality, safety, or availability on any rides, or that no mistakes will happen. I know and accept that traveling by vehicle (car, truck, SVU, fleeta, bus, taxi, etc.) has risks of serious injuries such as disability, being paralyzed, and death. I know and agree the risk of using [employer] is mine alone, and not [employer]. [employer] are provided as is and as are available. The transportation providers are in charge for all inspections and care of the vehicles used to provide these rides. I agree not to take legal action against [facility], its agents, employees, officers, directors, representatives, insurers, attorneys, assigns, successors, subsidiaries, and affiliates at any time for any reasons related directly or indirectly to using [employer]. I also agree not to take legal action against [facility] or its affiliates for any injury, death, or damage to property caused by or related to using  [employer]. I have read this Waiver and Release of Liability, and I understand the terms used in it and their legal meaning. This Waiver is freely and voluntarily given with the understanding that my right (or any legal minors) to legal action against  relating to [employer] is knowingly given up to use these services.   I attest that I read the Ride Waiver and Release of Liability to Dywane MALVA Lunger, gave Mr. Brandy the opportunity to ask questions and answered the questions asked (if any). I affirm that Aitan O Petrakis then provided consent for assistance with transportation.

## 2024-01-07 NOTE — Discharge Summary (Signed)
 " Physician Discharge Summary Note  Patient:  Benjamin [name] is an 53 y.o., male MRN:  [id number] DOB:  [birth year] Patient phone:  [phone] (home)  Patient address:   [address] [place] [address],   Date of Admission:  01/01/2024 Date of Discharge: 01/07/2024  Reason for Admission: Benjamin [name] MAHAN is a 53 y.o. male  with a past psychiatric history of schizophrenia. Patient initially arrived to [facility] on 12/12 for IVC competed by brother and was admitted to [facility] under IVC on 12/13 for crisis stabalization. PMHx is significant for chronic pain and GERD.   Discharge Diagnoses:  Principal Problem:   Psychoses [facility])  Hospital Course:   Upon admission, the patient presented with paranoid delusions and was continued on/started on the following medications: Zyprexa . Initial medication adjustments included titrated Zyprexa , and baseline labs and imaging were reviewed, with the following abnormalities noted for follow-up:  triglycerides 175, HDL 29 .  The treatment plan was reviewed daily in interdisciplinary meetings. Ongoing medication management resulted in further adjustments: Titration of Zyprexa , with final dosing optimized prior to discharge. The patient denies any side effects to prescribed psychiatric medication. The patient engaged in group programming focusing on coping skills, problem-solving, relaxation techniques, and also received supportive psychotherapy.  Over the course of hospitalization, the patient acclimated to the unit milieu and showed steady improvement in mood, affect, sleep, appetite, and participation in programming. Daily self-inventories reflected ongoing symptom reduction and increased treatment engagement.  Leading up to discharge, the patient reported stable mood, denied suicidal or homicidal ideation for more than 48 hours, and denied hallucinations or other psychotic symptoms.  I discussed with patient's brother the disposition plan early on during  admission, and further noted some concerns with patient returning to mother's home.  Throughout patient's admission, he continuously reported that his brother was using meth and endorsed a tumultuous relationship between both of them.  During this hospitalization, patient was calm, did not have any behavior problems and demonstrated poor cognitive function.  Discussed with patient's mother over the phone if she had any safety concerns with patient returning to her home, and she declined.  Patient has not exhibited any aggressive behaviors towards the mother, and believe with this patient's cognitive function going home to his mother's home would be safer than dispo to a shelter.  On day of discharge, patient reported stable mood, though still exhibiting baseline, nonviolent paranoid delusions. The patient expressed motivation to continue prescribed medications and follow-up care, noting good response of target symptoms of mood regulation and overall benefit from hospitalization. The patient was able to verbalize an individualized safety plan prior to discharge.  Mental Status Exam: Appearance: Disheveled Behavior: Fair eye contact, no psychomotor agitation noted Attitude: Polite Speech: Normal rate and volume Mood: Euthymic Affect: Congruent Thought Process: GD Thought Content: WNL SI/HI: Denies Perceptions: Denies AVH, does not appear preoccupied Judgement: Poor Insight: Poor Fund of Knowledge:   Physical Exam  General: Pleasant, well-appearing middle-aged white male. No acute distress. Pulmonary: Normal effort on room air.  Skin: No obvious rash or lesions. Neuro: A&Ox3.No focal deficits. MSK: Normal gait and station.  Review of Systems  No reported symptoms  Blood pressure 119/89, pulse 86, temperature 97.8 F (36.6 C), temperature source Oral, resp. rate 16, height 5' 7 (1.702 m), weight 61.2 kg, SpO2 98%. Body mass index is 21.14 kg/m.  Assets  Assets: Communication skills,  physical health, resilience, social support  Tobacco Use History[1] Tobacco Cessation:  A prescription for an FDA-approved  tobacco cessation medication provided at discharge  Metabolic Disorder Labs:  Lab Results  Component Value Date   HGBA1C 4.8 01/02/2024   MPG 91.06 01/02/2024   No results found for: PROLACTIN Lab Results  Component Value Date   CHOL 153 01/02/2024   TRIG 175 (H) 01/02/2024   HDL 29 (L) 01/02/2024   CHOLHDL 5.2 01/02/2024   VLDL 35 01/02/2024   LDLCALC 89 01/02/2024     Is patient on multiple antipsychotic therapies at discharge:  No   Has Patient had three or more failed trials of antipsychotic monotherapy by history:  No  Recommended Plan for Multiple Antipsychotic Therapies: NA   Allergies as of 01/07/2024   No Known Allergies      Medication List     TAKE these medications      Indication  hydrOXYzine  25 MG tablet Commonly known as: ATARAX  Take 1 tablet (25 mg total) by mouth 3 (three) times daily as needed for anxiety.  Indication: Feeling Anxious   nicotine  21 mg/24hr patch Commonly known as: NICODERM CQ  - dosed in mg/24 hours Place 1 patch (21 mg total) onto the skin daily.  Indication: Nicotine  Addiction   OLANZapine  5 MG tablet Commonly known as: ZYPREXA  Take 2 tablets (10 mg total) by mouth at bedtime.  Indication: Schizophrenia   traZODone  50 MG tablet Commonly known as: DESYREL  Take 1 tablet (50 mg total) by mouth at bedtime as needed for sleep.  Indication: Trouble Sleeping, Schizophrenia with Depression        Follow-up Information     Services, [facility]. Go on 01/11/2024.   Why: You have a hospital follow up appointment to obtain therapy and/or medication management services on 01/11/24 at 10:00 am, in person. Contact information: [address] [place] [address] [phone]                Discharge recommendations:  Continue psychiatric medications as prescribed. Follow up with  outpatient psychiatric provider and primary care physician as scheduled. Follow-up for abnormal lab results: triglycerides 175, HDL 29 Follow-up for management of chronic disease: Mild neurocognitive impairment Abstain from or limit alcohol, illicit drugs, and tobacco due to their negative impact on psychiatric and medical health. In the event of worsening symptoms, the patient is instructed to call the national crisis hotline (988), 911, or go the the closest ED for appropriate evaluation and treatment of symptoms.   Alfornia Light, DO, PGY-1 01/07/2024, 2:00 PM       [1]  Social History Tobacco Use  Smoking Status Every Day   Current packs/day: 1.00   Average packs/day: 1 pack/day for 30.0 years (30.0 ttl pk-yrs)   Types: Cigarettes  Smokeless Tobacco Never   "

## 2024-01-07 NOTE — Group Note (Signed)
 Date:  01/07/2024 Time:  9:38 AM  Group Topic/Focus:  Goals Group:   The focus of this group is to help patients establish daily goals to achieve during treatment and discuss how the patient can incorporate goal setting into their daily lives to aide in recovery. Orientation:   The focus of this group is to educate the patient on the purpose and policies of crisis stabilization and provide a format to answer questions about their admission.  The group details unit policies and expectations of patients while admitted.    Participation Level:  Did Not Attend   Mose Colaizzi 01/07/2024, 9:38 AM

## 2024-01-07 NOTE — Progress Notes (Signed)
(  Sleep Hours) - 8.75 (Any PRNs that were needed, meds refused, or side effects to meds)- none (Any disturbances and when (visitation, over night)- none (Concerns raised by the patient)- none  (SI/HI/AVH)- denies

## 2024-01-07 NOTE — Plan of Care (Signed)
  Problem: Education: Goal: Knowledge of [facility] Education information/materials will improve Outcome: Adequate for Discharge Goal: Emotional status will improve Outcome: Adequate for Discharge Goal: Mental status will improve Outcome: Adequate for Discharge Goal: Verbalization of understanding the information provided will improve Outcome: Adequate for Discharge   

## 2024-01-07 NOTE — Progress Notes (Signed)
" °  [facility] Adult Case Management Discharge Plan :  Will you be returning to the same living situation after discharge:  Yes,  pt returning home at discharge At discharge, do you have transportation home?: Yes,  CSW arranged [employer] taxi to [address], [place] for [address]30 Do you have the ability to pay for your medications: Yes,  pt has active health insurance coverage  Release of information consent forms completed and in the chart;  Patient's signature needed at discharge.  Patient to Follow up at:  Follow-up Information     [facility]. Go on 01/11/2024.   Why: You have a hospital follow up appointment to obtain therapy and/or medication management services on 01/11/24 at 10:00 am, in person. Contact information: [address] [place] [address] [phone]                 Next level of care provider has access to [facility] Link:no  Safety Planning and Suicide Prevention discussed: Yes,  Mother, Zimri Brennen [phone]     Has patient been referred to the Quitline?: Patient refused referral for treatment  Patient has been referred for addiction treatment: No known substance use disorder.  [name], LCSWA 01/07/2024, 8:30 AM "

## 2024-01-07 NOTE — Plan of Care (Signed)
   Problem: Education: Goal: Knowledge of [facility] Education information/materials will improve Outcome: Progressing Goal: Emotional status will improve Outcome: Progressing Goal: Mental status will improve Outcome: Progressing Goal: Verbalization of understanding the information provided will improve Outcome: Progressing

## 2024-01-07 NOTE — Progress Notes (Signed)
 Patient discharged off unit at [phone]. Patient belongings reviewed and acknowledged by patient. AVS and Transition Record reviewed and acknowledged by patient. Safety plan completed by patient, reviewed by nurse with patient and copy provided. Any medications and or prescriptions necessary for discharge addressed and provided to patient. Patient denies SI, plan or intent. Denies HI. Denies AVH. No observed or reported side effects to medication. No observed or reported agitation, aggression, or other acute emotional distress. No reported or observed physical abnormalities or concerns. Patient transportation from facility verified and observed.

## 2024-01-07 NOTE — Group Note (Signed)
 Date:  01/07/2024 Time:  9:57 AM  Group Topic/Focus:  RECREATION THERAPY (KEEP IT GOING VOLLYBALL) Purpose: The Keep It Going Volleyball session was organized as a recreational group activity aimed at promoting physical movement, social interaction, and team-building in a supportive and structured environment. The activity serves as a tool to engage participants in an enjoyable and non-competitive setting, encouraging positive mental health through physical exercise, communication, and group participation.  Objectives:  Enhance Socialization: Provide a platform for participants to engage with peers in a relaxed and enjoyable setting, promoting healthy interpersonal interactions.  Promote Physical Activity: Encourage movement and physical exercise, which has been shown to help improve mood, reduce stress, and increase overall well-being.  Build Teamwork and Cooperation: Jerrye a agricultural engineer and cooperation among participants, helping them work together toward a common goal in a non-competitive manner.  Boost Mental Well-Being: Provide a low-pressure environment where participants can engage in fun and rewarding activity, which contributes to reducing anxiety and improving self-esteem.  Increase Engagement: Create an opportunity for participants to be actively engaged and present, reducing feelings of isolation and promoting a sense of belonging.  Activity Overview: The session involved a relaxed, non-competitive game of volleyball where participants worked together in teams. The focus was on having fun, training and development officer, and encouraging active participation. Rules were kept simple to ensure that everyone could take part regardless of skill level.  Facilitators provided gentle guidance and encouragement, ensuring that the activity remained inclusive and supportive. Emphasis was placed on fostering a positive atmosphere, with participants encouraged to celebrate each others efforts and  accomplishments, no matter how small.  Outcome/Results:  Participants showed increased engagement and enthusiasm throughout the activity.  Positive interactions were observed between participants, with moments of laughter, encouragement, and team bonding.  Physical activity appeared to contribute to an improvement in overall mood and energy levels.  Several participants expressed enjoyment and a desire to continue participating in future recreational activities.  Recommendations:  Continue incorporating recreational activities like volleyball into the schedule to promote group cohesion and mental well-being.  Explore further team-based or movement-focused activities to enhance social skills and emotional regulation.    Participation Level:  Did Not Attend  Benjamin [name] 01/07/2024, 9:57 AM

## 2024-01-07 NOTE — BHH Suicide Risk Assessment (Signed)
 Suicide Risk Assessment  Discharge Assessment     [facility] Discharge Suicide Risk Assessment  Principal Problem: Psychoses [facility]) Discharge Diagnoses: Principal Problem:   Psychoses (HCC)  Musculoskeletal: Strength & Muscle Tone: within normal limits Gait & Station: normal Patient leans: N/A  Psychiatric Specialty Exam  Presentation  General Appearance:  Disheveled  Eye Contact: Fair  Speech: Normal Rate; Garbled  Speech Volume: Normal  Handedness:No data recorded  Mood and Affect  Mood: Euthymic  Duration of Depression Symptoms: No data recorded Affect: Congruent   Thought Process  Thought Processes: Goal Directed  Descriptions of Associations:Intact  Orientation:Full (Time, Place and Person)  Thought Content:WDL  History of Schizophrenia/Schizoaffective disorder:No data recorded Duration of Psychotic Symptoms:No data recorded Hallucinations:Hallucinations: None  Ideas of Reference:None  Suicidal Thoughts:Suicidal Thoughts: No  Homicidal Thoughts:Homicidal Thoughts: No   Sensorium  Memory: Immediate Fair  Judgment: Poor  Insight: Poor   Executive Functions  Concentration: Fair  Attention Span: Fair  Recall: Fair  Fund of Knowledge: Fair  Language: Fair   Psychomotor Activity  Psychomotor Activity:Psychomotor Activity: Normal   Assets  Assets: Communication Skills; Physical Health; Resilience; Social Support   Sleep  Sleep:Sleep: Good  Estimated Sleeping Duration (Last 24 Hours): 5.25-6.00 hours  Physical Exam: Physical Exam Constitutional:      General: He is not in acute distress.    Appearance: He is not ill-appearing, toxic-appearing or diaphoretic.  Pulmonary:     Effort: Pulmonary effort is normal.  Neurological:     General: No focal deficit present.     Mental Status: He is alert.    Review of Systems  Psychiatric/Behavioral:  Negative for depression, hallucinations and suicidal ideas. The patient  does not have insomnia.    Blood pressure 119/89, pulse 86, temperature 97.8 F (36.6 C), temperature source Oral, resp. rate 16, height 5' 7 (1.702 m), weight 61.2 kg, SpO2 98%. Body mass index is 21.14 kg/m.  Mental Status Per Nursing Assessment::   On Admission:  Thoughts of violence towards others  Demographic Factors:  Male, Caucasian, Low socioeconomic status, and Unemployed  Loss Factors: NA  Historical Factors: Impulsivity  Risk Reduction Factors:   Living with another person, especially a relative and Positive social support  Continued Clinical Symptoms:  Schizophrenia:   Paranoid or undifferentiated type Unstable or Poor Therapeutic Relationship Previous Psychiatric Diagnoses and Treatments  Cognitive Features That Contribute To Risk:  None    Suicide Risk:  Minimal: No identifiable suicidal ideation.  Patients presenting with no risk factors but with morbid ruminations; may be classified as minimal risk based on the severity of the depressive symptoms   Follow-up Information     [facility], [facility]. Go on 01/11/2024.   Why: You have a hospital follow up appointment to obtain therapy and/or medication management services on 01/11/24 at 10:00 am, in person. Contact information: [address] [place] [address] [phone]                 Plan Of Care/Follow-up recommendations:  Activity: as tolerated  Diet: heart healthy  Other: -Follow-up with your outpatient psychiatric provider -instructions on appointment date, time, and address (location) are provided to you in discharge paperwork.  -Take your psychiatric medications as prescribed at discharge - instructions are provided to you in the discharge paperwork  -Follow-up with outpatient primary care doctor and other specialists -for management of chronic medical disease, including: Mild neurocognitive impairment, consider neuropsychiatric testing  -Testing: Follow-up with  outpatient provider for abnormal lab results:  triglycerides 175, HDL 29  -Recommend abstinence from alcohol, tobacco, and other illicit drug use at discharge.   -If your psychiatric symptoms recur, worsen, or if you have side effects to your psychiatric medications, call your outpatient psychiatric provider, 911, [phone] or go to the nearest emergency department.  -If suicidal thoughts recur, call your outpatient psychiatric provider, 911, [phone] or go to the nearest emergency department.   Alfornia Light, DO 01/07/2024, 7:32 AM
